# Patient Record
Sex: Female | Born: 1975 | Race: White | Hispanic: No | Marital: Married | Smoking: Former smoker
Health system: Southern US, Community
[De-identification: ages and names within clinical notes are randomized; demographics above are authoritative.]

## PROBLEM LIST (undated history)

## (undated) ENCOUNTER — Inpatient Hospital Stay (HOSPITAL_COMMUNITY): Payer: Self-pay

## (undated) DIAGNOSIS — R51 Headache: Secondary | ICD-10-CM

## (undated) DIAGNOSIS — R233 Spontaneous ecchymoses: Secondary | ICD-10-CM

## (undated) DIAGNOSIS — R635 Abnormal weight gain: Secondary | ICD-10-CM

## (undated) DIAGNOSIS — R0602 Shortness of breath: Secondary | ICD-10-CM

## (undated) DIAGNOSIS — N644 Mastodynia: Secondary | ICD-10-CM

## (undated) DIAGNOSIS — R5383 Other fatigue: Secondary | ICD-10-CM

## (undated) DIAGNOSIS — T7840XA Allergy, unspecified, initial encounter: Secondary | ICD-10-CM

## (undated) DIAGNOSIS — G479 Sleep disorder, unspecified: Secondary | ICD-10-CM

## (undated) DIAGNOSIS — R238 Other skin changes: Secondary | ICD-10-CM

## (undated) DIAGNOSIS — N6452 Nipple discharge: Secondary | ICD-10-CM

## (undated) DIAGNOSIS — R519 Headache, unspecified: Secondary | ICD-10-CM

## (undated) DIAGNOSIS — R079 Chest pain, unspecified: Secondary | ICD-10-CM

## (undated) DIAGNOSIS — R55 Syncope and collapse: Secondary | ICD-10-CM

## (undated) DIAGNOSIS — J939 Pneumothorax, unspecified: Secondary | ICD-10-CM

## (undated) HISTORY — DX: Sleep disorder, unspecified: G47.9

## (undated) HISTORY — PX: ORIF ULNAR FRACTURE: SHX5417

## (undated) HISTORY — DX: Headache: R51

## (undated) HISTORY — DX: Allergy, unspecified, initial encounter: T78.40XA

## (undated) HISTORY — DX: Mastodynia: N64.4

## (undated) HISTORY — PX: PLEURAL SCARIFICATION: SHX748

## (undated) HISTORY — DX: Other skin changes: R23.8

## (undated) HISTORY — DX: Spontaneous ecchymoses: R23.3

## (undated) HISTORY — DX: Nipple discharge: N64.52

## (undated) HISTORY — PX: BILATERAL OOPHORECTOMY: SHX1221

## (undated) HISTORY — DX: Abnormal weight gain: R63.5

## (undated) HISTORY — DX: Headache, unspecified: R51.9

## (undated) HISTORY — DX: Other fatigue: R53.83

## (undated) HISTORY — DX: Shortness of breath: R06.02

## (undated) HISTORY — DX: Syncope and collapse: R55

## (undated) HISTORY — DX: Chest pain, unspecified: R07.9

---

## 1999-12-04 ENCOUNTER — Encounter: Payer: Self-pay | Admitting: Emergency Medicine

## 1999-12-04 ENCOUNTER — Inpatient Hospital Stay (HOSPITAL_COMMUNITY): Admission: EM | Admit: 1999-12-04 | Discharge: 1999-12-05 | Payer: Self-pay | Admitting: Emergency Medicine

## 1999-12-05 ENCOUNTER — Encounter: Payer: Self-pay | Admitting: Cardiothoracic Surgery

## 1999-12-06 ENCOUNTER — Encounter: Payer: Self-pay | Admitting: Cardiothoracic Surgery

## 1999-12-06 ENCOUNTER — Inpatient Hospital Stay (HOSPITAL_COMMUNITY): Admission: EM | Admit: 1999-12-06 | Discharge: 1999-12-10 | Payer: Self-pay | Admitting: Emergency Medicine

## 1999-12-06 ENCOUNTER — Encounter: Payer: Self-pay | Admitting: Thoracic Surgery (Cardiothoracic Vascular Surgery)

## 1999-12-07 ENCOUNTER — Encounter: Payer: Self-pay | Admitting: Thoracic Surgery (Cardiothoracic Vascular Surgery)

## 1999-12-08 ENCOUNTER — Encounter: Payer: Self-pay | Admitting: Thoracic Surgery (Cardiothoracic Vascular Surgery)

## 1999-12-09 ENCOUNTER — Encounter: Payer: Self-pay | Admitting: Thoracic Surgery (Cardiothoracic Vascular Surgery)

## 1999-12-10 ENCOUNTER — Encounter: Payer: Self-pay | Admitting: Thoracic Surgery (Cardiothoracic Vascular Surgery)

## 1999-12-23 ENCOUNTER — Encounter: Admission: RE | Admit: 1999-12-23 | Discharge: 1999-12-23 | Payer: Self-pay | Admitting: Cardiothoracic Surgery

## 1999-12-23 ENCOUNTER — Encounter: Payer: Self-pay | Admitting: Cardiothoracic Surgery

## 2003-11-03 ENCOUNTER — Emergency Department (HOSPITAL_COMMUNITY): Admission: EM | Admit: 2003-11-03 | Discharge: 2003-11-03 | Payer: Self-pay | Admitting: Emergency Medicine

## 2003-11-04 ENCOUNTER — Emergency Department (HOSPITAL_COMMUNITY): Admission: EM | Admit: 2003-11-04 | Discharge: 2003-11-04 | Payer: Self-pay | Admitting: Emergency Medicine

## 2003-11-06 ENCOUNTER — Emergency Department (HOSPITAL_COMMUNITY): Admission: EM | Admit: 2003-11-06 | Discharge: 2003-11-06 | Payer: Self-pay | Admitting: Emergency Medicine

## 2003-11-10 ENCOUNTER — Other Ambulatory Visit: Admission: RE | Admit: 2003-11-10 | Discharge: 2003-11-10 | Payer: Self-pay | Admitting: Obstetrics and Gynecology

## 2004-03-21 ENCOUNTER — Inpatient Hospital Stay (HOSPITAL_COMMUNITY): Admission: AD | Admit: 2004-03-21 | Discharge: 2004-03-21 | Payer: Self-pay | Admitting: Obstetrics and Gynecology

## 2004-03-24 ENCOUNTER — Inpatient Hospital Stay (HOSPITAL_COMMUNITY): Admission: AD | Admit: 2004-03-24 | Discharge: 2004-03-24 | Payer: Self-pay | Admitting: Obstetrics and Gynecology

## 2004-04-01 ENCOUNTER — Inpatient Hospital Stay (HOSPITAL_COMMUNITY): Admission: AD | Admit: 2004-04-01 | Discharge: 2004-04-01 | Payer: Self-pay | Admitting: Obstetrics and Gynecology

## 2004-04-05 ENCOUNTER — Inpatient Hospital Stay (HOSPITAL_COMMUNITY): Admission: AD | Admit: 2004-04-05 | Discharge: 2004-04-05 | Payer: Self-pay | Admitting: Obstetrics and Gynecology

## 2004-04-06 ENCOUNTER — Inpatient Hospital Stay (HOSPITAL_COMMUNITY): Admission: AD | Admit: 2004-04-06 | Discharge: 2004-04-06 | Payer: Self-pay | Admitting: Obstetrics and Gynecology

## 2004-04-11 ENCOUNTER — Inpatient Hospital Stay (HOSPITAL_COMMUNITY): Admission: AD | Admit: 2004-04-11 | Discharge: 2004-04-11 | Payer: Self-pay | Admitting: Obstetrics and Gynecology

## 2004-04-23 ENCOUNTER — Inpatient Hospital Stay (HOSPITAL_COMMUNITY): Admission: AD | Admit: 2004-04-23 | Discharge: 2004-04-23 | Payer: Self-pay | Admitting: Obstetrics and Gynecology

## 2004-04-25 ENCOUNTER — Observation Stay (HOSPITAL_COMMUNITY): Admission: AD | Admit: 2004-04-25 | Discharge: 2004-04-26 | Payer: Self-pay | Admitting: Obstetrics and Gynecology

## 2004-05-05 ENCOUNTER — Observation Stay (HOSPITAL_COMMUNITY): Admission: AD | Admit: 2004-05-05 | Discharge: 2004-05-06 | Payer: Self-pay | Admitting: Obstetrics and Gynecology

## 2004-05-09 ENCOUNTER — Inpatient Hospital Stay (HOSPITAL_COMMUNITY): Admission: AD | Admit: 2004-05-09 | Discharge: 2004-05-09 | Payer: Self-pay | Admitting: Obstetrics and Gynecology

## 2004-05-14 ENCOUNTER — Inpatient Hospital Stay (HOSPITAL_COMMUNITY): Admission: AD | Admit: 2004-05-14 | Discharge: 2004-05-17 | Payer: Self-pay | Admitting: Obstetrics and Gynecology

## 2004-05-21 ENCOUNTER — Inpatient Hospital Stay (HOSPITAL_COMMUNITY): Admission: AD | Admit: 2004-05-21 | Discharge: 2004-05-21 | Payer: Self-pay | Admitting: Obstetrics and Gynecology

## 2004-05-26 ENCOUNTER — Inpatient Hospital Stay (HOSPITAL_COMMUNITY): Admission: AD | Admit: 2004-05-26 | Discharge: 2004-05-26 | Payer: Self-pay | Admitting: Obstetrics and Gynecology

## 2004-05-29 ENCOUNTER — Inpatient Hospital Stay (HOSPITAL_COMMUNITY): Admission: AD | Admit: 2004-05-29 | Discharge: 2004-05-29 | Payer: Self-pay | Admitting: Obstetrics and Gynecology

## 2004-06-03 ENCOUNTER — Inpatient Hospital Stay (HOSPITAL_COMMUNITY): Admission: AD | Admit: 2004-06-03 | Discharge: 2004-06-03 | Payer: Self-pay | Admitting: Obstetrics and Gynecology

## 2004-06-09 ENCOUNTER — Inpatient Hospital Stay (HOSPITAL_COMMUNITY): Admission: AD | Admit: 2004-06-09 | Discharge: 2004-06-09 | Payer: Self-pay | Admitting: Obstetrics and Gynecology

## 2004-06-10 ENCOUNTER — Inpatient Hospital Stay (HOSPITAL_COMMUNITY): Admission: AD | Admit: 2004-06-10 | Discharge: 2004-06-12 | Payer: Self-pay | Admitting: Obstetrics and Gynecology

## 2004-06-10 ENCOUNTER — Encounter (INDEPENDENT_AMBULATORY_CARE_PROVIDER_SITE_OTHER): Payer: Self-pay | Admitting: Specialist

## 2004-07-23 ENCOUNTER — Other Ambulatory Visit: Admission: RE | Admit: 2004-07-23 | Discharge: 2004-07-23 | Payer: Self-pay | Admitting: Obstetrics and Gynecology

## 2007-02-09 ENCOUNTER — Emergency Department (HOSPITAL_COMMUNITY): Admission: EM | Admit: 2007-02-09 | Discharge: 2007-02-09 | Payer: Self-pay | Admitting: Emergency Medicine

## 2007-02-12 ENCOUNTER — Ambulatory Visit (HOSPITAL_COMMUNITY): Admission: RE | Admit: 2007-02-12 | Discharge: 2007-02-12 | Payer: Self-pay | Admitting: Emergency Medicine

## 2007-02-23 ENCOUNTER — Ambulatory Visit (HOSPITAL_COMMUNITY): Admission: RE | Admit: 2007-02-23 | Discharge: 2007-02-23 | Payer: Self-pay | Admitting: Family Medicine

## 2008-02-10 ENCOUNTER — Emergency Department (HOSPITAL_COMMUNITY): Admission: EM | Admit: 2008-02-10 | Discharge: 2008-02-10 | Payer: Self-pay | Admitting: Emergency Medicine

## 2008-02-27 ENCOUNTER — Emergency Department (HOSPITAL_COMMUNITY): Admission: EM | Admit: 2008-02-27 | Discharge: 2008-02-27 | Payer: Self-pay | Admitting: Emergency Medicine

## 2008-03-10 ENCOUNTER — Emergency Department (HOSPITAL_COMMUNITY): Admission: EM | Admit: 2008-03-10 | Discharge: 2008-03-10 | Payer: Self-pay | Admitting: Emergency Medicine

## 2008-03-12 ENCOUNTER — Ambulatory Visit: Payer: Self-pay | Admitting: Cardiology

## 2008-03-12 LAB — CONVERTED CEMR LAB: TSH: 0.82 microintl units/mL (ref 0.35–5.50)

## 2008-03-13 ENCOUNTER — Ambulatory Visit: Payer: Self-pay | Admitting: Cardiology

## 2008-05-07 ENCOUNTER — Ambulatory Visit: Payer: Self-pay | Admitting: Cardiology

## 2008-05-16 ENCOUNTER — Emergency Department (HOSPITAL_COMMUNITY): Admission: EM | Admit: 2008-05-16 | Discharge: 2008-05-16 | Payer: Self-pay | Admitting: Emergency Medicine

## 2011-02-15 NOTE — Assessment & Plan Note (Signed)
[facility]                            CARDIOLOGY OFFICE NOTE   NAME:Shannon Kemp, Shannon Kemp                   MRN:          [id number]  DATE:05/07/2008                            DOB:          03-17-1976    PRIMARY CARE:  Maxine Glenn Winter PA-C   REASON FOR PRESENTATION:  Evaluate patient with palpitation and chest  pain.   HISTORY OF PRESENT ILLNESS:  The patient is 35 years old.  I saw her in  June 2009 for evaluation of the above complaints.  She had been to the  emergency room.  She had had some palpitations.  I put a Holter monitor  on for 48 hours.  This demonstrated sinus rhythm.  There were occasional  PVCs and PACs that were rare.  There were occasional blocked PACs with  2:1 conduction and sinus arrhythmia.  She did have 2 brief episodes of a  sustained tachycardia that looked like a sinus tachycardia.  This  happened at about 5 p.m., both days.  She does not think she was doing  anything vigorous and does not recall these episodes.  They were very  short-lived.  She has had no syncope.  She has had no shortness of  breath or PND.  She has some chest discomfort which seems to happen very  briefly every day and not as frequently as it was.   PAST MEDICAL HISTORY:  Spontaneous pneumothorax in 2001, hypertension,  diabetes, and hyperlipidemia.   PAST SURGICAL HISTORY:  None.   ALLERGIES:  None.   MEDICATIONS:  Multivitamin.   REVIEW OF SYSTEMS:  As stated in the HPI and otherwise negative for  other systems.   PHYSICAL EXAMINATION:  GENERAL:  The patient is in no distress.  VITAL SIGNS:  Blood pressure 110/80, heart rate 92 and regular, weight  125 pounds, body mass index 20.  HEENT:  Eyes are unremarkable.  Pupils are equal, round and reactive to  light.  Fundi not visualized.  Oral mucosa unremarkable.  NECK:  No jugular distention at 45 degrees, carotid upstroke brisk and  symmetrical, no bruits, no thyromegaly.  LYMPHATICS:  No  cervical, axillary, or inguinal adenopathy.  LUNGS:  Clear to auscultation bilaterally.  HEART:  PMI not displaced or sustained, S1and S2 within normal limits.  No S3, no S4, no clicks, no rubs, no murmurs.  ABDOMEN:  Flat, positive  bowel sounds normal in frequency and pitch.  No bruits, no rebound, no  guarding, no midline pulsatile mass, no organomegaly.  SKIN:  No rashes, no nodules.  EXTREMITIES:  Pulses 2+ , no edema.   ASSESSMENT AND PLAN:  1. Palpitations.  The patient has not been having any symptomatic      palpitations.  She is having some daily very brief chest discomfort      that could be palpitations.  However, this is brief.  It is not      reproducible.  She is able to do everything she wants without any      cardiovascular symptoms and has had none of the severe episodes      that prompted  her ER visit.  The monitor does show the tachy      arrhythmia.  We did check a TSH, which was normal.  Other labs were      done in the ER and were normal.  At this point, in the absence of      further symptoms, no further cardiovascular testing is suggested.      The patient knows to call me if she has any symptomatic      palpitations.  If she has any sustained tachy palpitations, she      should again present to the emergency room.  2. Follow up.  I will see her back then as needed.     Rollene Rotunda, MD, [facility]  Electronically Signed    JH/MedQ  DD: 05/07/2008  DT: 05/08/2008  Job #: [id number]

## 2011-02-15 NOTE — Assessment & Plan Note (Signed)
[facility]                            CARDIOLOGY OFFICE NOTE   NAME:Shannon Kemp, Shannon Kemp                   MRN:          [id number]  DATE:03/12/2008                            DOB:          03/06/1976    REFERRING PHYSICIAN:  Bryna Colander, [place]   REASON FOR PRESENTATION:  Evaluate patient with arrhythmia and chest  pain.   HISTORY OF PRESENT ILLNESS:  The patient is a pleasant 35 year old  without prior cardiac history.  She was at the emergency room on March 10, 2008 with some chest discomfort.  This started as some shoulder pain  associated with previous right rotator cuff injury.  She went to  [facility] but while she was there developed tachycardia and chest pain.  This was sharp and fairly significant.  She did not have any associated  nausea, vomiting, diaphoresis, or new shortness of breath.  This was  aggravated by coughing and movement.  It did not radiate to her neck or  to her arms.  She never had this discomfort before.  In the emergency  room, she had no acute EKG changes.  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  She  had known previous pneumothorax.  Her cardiac enzymes were negative.  She was not felt to have angina, but she was noted apparently to have an  arrhythmia while she was there.  I do not have any of the strips.  The  patient reports that it has been noted heart rates would increase up to  the 120 range and down to the 90s while at rest.   The patient says in retrospect she noticed this before.  She has since  noticed it daily.  It only lasts for few seconds, but her heart rate  will increase and then go back down.  She does not describe it as an  abrupt onset.  She has felt a little lightheaded.  She has had some mild  orthostatic symptoms.  She has had no syncope.  She does not get chest  discomfort with this.  It does not happen when she is active.  She has  otherwise been feeling relatively well.   PAST MEDICAL HISTORY:  Spontaneous pneumothorax in 2001.  She has no  history of hypertension, diabetes, and hyperlipidemia.   PAST SURGICAL HISTORY:  None.   ALLERGIES:  MORPHINE.   MEDICATIONS:  Ibuprofen and oxycodone.   SOCIAL HISTORY:  The patient works in Clinical biochemist.  She is married.  She has a 33 year old and a 7-year-old.  She smoked a little bit  quitting in 2001.  She does not drink alcohol.   FAMILY HISTORY:  Noncontributory for early coronary artery disease,  sudden cardiac death, cardiomyopathy, syncope, or arrhythmias.   REVIEW OF SYSTEMS:  Positive for occasional headaches and dizziness,  mild cough, and joint pains.  Negative for other systems.   PHYSICAL EXAMINATION:  GENERAL: The patient is in no distress.  VITAL SIGNS: Blood pressure 130/85, heart rate 94 and regular, weight  124 pounds, and body mass index is 22.  HEENT:  Eyelids  are unremarkable.  Pupils equal, round, and reactive to  light.  Fundi within normal limits, oral mucosa unremarkable.  NECK:  No jugular venous distention at 45 degrees.  Carotid upstrokes  are brisk and symmetrical.  No bruits, no thyromegaly.  LYMPHATICS: No cervical, axillary, and inguinal adenopathy.  LUNGS:  Clear to auscultation bilaterally.  BACK:  No costovertebral angle tenderness.  CHEST:  Unremarkable.  HEART:  PMI is nondisplaced or sustained, S1 and S2 within normal  limits.  No S3 and no S4.  No clicks, rubs, or murmurs.  ABDOMEN:  Flat, positive bowel sounds, normal in frequency and pitch, no  bruits, rebound, guarding or midline pulsatile mass.  No  hepatosplenomegaly.  SKIN:  No rashes.  EXTREMITIES:  2+ pulses throughout, no edema, no cyanosis or clubbing.  NEURO:  Oriented to person, place, and time.  Cranial nerves II through  XII grossly intact, motor grossly intact.   EKG sinus rhythm, rate 86, axis within normal limits, intervals within  normal limits, RSR  prime V1 and V2, no acute ST-T wave changes.   ASSESSMENT/PLAN:  1. Chest pain.  The patient's chest pain is very atypical.  She has no      significant risk factors for obstructive coronary disease.  I think      the pretest probability of obstructive coronary disease is very,      very low and further testing would not be justified.  2. Palpitations.  The patient is describing palpitations and      apparently had some arrhythmia in the ER though I do not have      documentation of this.  I am going to have her wear a 48-hour      Holter monitor and I will check a TSH.  Further evaluation will be      based on this.  3. Followup.  I will see her back in 1 month or sooner if she has any      problems with further arrhythmias or discomfort.      Rollene Rotunda, MD, [facility]  Electronically Signed    JH/MedQ  DD: 03/12/2008  DT: 03/13/2008  Job #: [id number]   cc:   Bryna Colander, PA

## 2011-02-18 NOTE — Discharge Summary (Signed)
[facility]  Patient:    Shannon Kemp, Shannon Kemp                       MRN: [id number] Adm. Date:  [id number] Disc. Date: [id number] Attending:  Charlett Lango Dictator:   Loura Pardon, P.A.                           Discharge Summary  DATE OF BIRTH:  Jul 06, 1976  ATTENDING PHYSICIAN:  Salvatore Decent. Dorris Fetch, M.D., and Gwenith Daily. Tyrone Sage, M.D.  FINAL DIAGNOSIS:  Spontaneous left pneumothorax.  SECONDARY DIAGNOSES:  None.  DISCHARGE DISPOSITION:  [name] was judged a suitable candidate for discharge on hospital day #5 after presenting with a spontaneous left pneumothorax.  She had a left thoracostomy tube placed by Dr. Charlett Lango on March 5.  She had good expansion of her 20 to 25% left pneumothorax.  After the tube was placed, her pain was controlled at first with a PCA morphine pump and then changed to adequate oral medications.  The chest tube was removed on hospital day #4.  There was a 15% left apical pneumothorax after the chest tube was removed.  The patient was asymptomatic except for some chest pain at the site of the incision.  A chest x-ray was taken on the day after the tube was removed, hospital day #5.  There was no change in the size of the left apical pneumothorax and she was discharged home.  DISCHARGE MEDICATIONS:  Ultram 50 mg two tablets every 4 to 6 hours p.r.n. pain.  DISCHARGE ACTIVITY:  Ambulation as tolerated.  She is asked not to drive while taking Ultram.  DISCHARGE DIET:  Low sodium, low cholesterol.  WOUND CARE:  She is able to bathe starting Saturday, March 10.  She is to keep her incisions clean and dry.  FOLLOW-UP:  She will have an office visit with Dr. Sheliah Plane, Thursday, December 23, 1999, at 1:30 in the afternoon, and she will have a chest x-ray one hour before her appointment with Dr. Tyrone Sage.  HISTORY OF PRESENT ILLNESS:  [name] is a 35 year old female admitted to [facility] on March 4 by Dr. Tyrone Sage.  She had a 15% spontaneous left pneumothorax.  She was kept for observation there and, after no change was observed by chest x-ray, she was discharged. However, the morning of March 5, she had increasing shortness of breath and stabbing, sharp, left-sided chest pain.  She came to the emergency room at [facility].  Chest x-ray taken there showed a 20 to 25% pneumothorax.  Her old films were not available for comparison although the pneumothorax at [facility] was estimated at 15%.  HOSPITAL COURSE:  She was admitted by Dr. Charlett Lango and a #28 French left thoracostomy tube was placed without complications.  Follow-up chest x-ray showed partial re-expansion of the left lung with a pneumothorax estimated at 10 to 15% down from the 20 to 25% on admission.  Chest x-ray on March 6 showed continuance of small apical pneumothorax.  She was changed from her morphine sulfate pump to Tylox and Ultram.  On hospital day #3, she was achieving 97% oxygen saturations on room air.  The chest tube had minimal drainage, no air leak.  She was taken off suction at night, and a chest x-ray was taken on the morning of March 8, hospital day #4.  There was a tiny left apical pneumothorax unchanged from March 7 after 16 hours without suction. The chest tube was discontinued.  Follow-up chest x-ray showed a 15% pneumothorax after the chest tube was removed.  [name] was asymptomatic. She was kept in the hospital overnight and a follow-up chest x-ray was taken on the morning of March 9.  There was no change in the size of the left pneumothorax.  Pain was well controlled with oral analgesia and she was discharged home with follow-up with Dr. Tyrone Sage in two weeks. DD:  02/03/00 TD:  02/06/00 Job: [id number]

## 2011-02-18 NOTE — Discharge Summary (Signed)
Shannon Kemp, Shannon Kemp            ACCOUNT NO.:  [id number]   MEDICAL RECORD NO.:  [id number]          PATIENT TYPE:  OBV   LOCATION:  9168                          [facility]ACILITY:  [facility]   PHYSICIAN:  [name], M.D.DATE OF BIRTH:  06-05-1976   DATE OF ADMISSION:  05/05/2004  DATE OF DISCHARGE:  05/06/2004                                 DISCHARGE SUMMARY   DISCHARGE DIAGNOSES:  1.  Intrauterine pregnancy at 32 weeks 5 days gestation.  2.  Preterm labor versus irritability.   OPERATIONS AND SPECIAL PROCEDURES:  None.   CONSULTATIONS:  None.   DISCHARGE MEDICATIONS:  Terbutaline 2.5 mg q.4h.   HISTORY AND PHYSICAL:  This is a 35 year old gravida 3 para 1 AB 1 admitted  per Dr. Chiquita Loth for evaluation of contractions.  The patient is  currently on terbutaline 2.5 mg q.4h.  For the remainder of the history and  physical, please see chart.   HOSPITAL COURSE:  The patient was admitted to [facility] of  [place].  Admission laboratory studies were drawn.  She was observed  during the day of hospitalization and noted to have only rare contractions.  Fetal heart rate remained stable.  On May 06, 2004 in the evening she was  felt to be stable for discharge and was discharged home in satisfactory  condition.   DISPOSITION:  The patient is to return to [facility] and Obstetrics  within 1-2 weeks.      JAM/MEDQ  D:  06/30/2004  T:  06/30/2004  Job:  [id number]

## 2011-02-18 NOTE — Discharge Summary (Signed)
Shannon Kemp, ADSIT            ACCOUNT NO.:  [id number]   MEDICAL RECORD NO.:  [id number]          PATIENT TYPE:  INP   LOCATION:  9153                          [facility]ILITY[facility]  [facility]   PHYSICIAN:  [name], M.D.DATE OF BIRTH:  08/20/1976   DATE OF ADMISSION:  05/14/2004  DATE OF DISCHARGE:  05/17/2004                                 DISCHARGE SUMMARY   DISCHARGE DIAGNOSES:  1.  Intrauterine pregnancy at approximately [redacted] weeks gestation, undelivered.  2.  Preterm labor.  3.  Questionable urinary tract infection.   OPERATIONS AND SPECIAL PROCEDURES:  None.   DISCHARGE MEDICATIONS:  Procardia 30 mg p.o. q.6h.   HISTORY AND PHYSICAL:  This is a 35 year old gravida 3 para 1-0-1-0 with [facility]  June 27, 2004 currently at 33 weeks 4 days gestation.  The patient  presented complaining of contractions every 3-4 minutes.  She was already on  antibiotics for a questionable UTI.  Cervical examination per R.N. revealed  a 2-3 cm dilatation, 60% effacement, -3 station, vertex presentation.  Membranes appeared intact.  For the remainder of the past medical history  and history and physical, please see chart.   HOSPITAL COURSE:  The patient was admitted to [facility]  [place].  Admission laboratory studies were drawn.  On May 14, 2004  she was placed on magnesium sulfate.  She was also given betamethasone in  order to expedite the fetal lung maturity.  On May 16, 2004 she was  switched to nifedipine 30 mg q.6h. per Dr. Sydnee Cabal.  By May 17, 2004  she reported only rare contraction.  She was felt to be stable for discharge  and was discharged home in satisfactory condition.  She is to return to the  office in 1 week.      JAM/MEDQ  D:  06/30/2004  T:  06/30/2004  Job:  [id number]

## 2011-02-18 NOTE — Discharge Summary (Signed)
Shannon Kemp, Shannon Kemp            ACCOUNT NO.:  [id number]   MEDICAL RECORD NO.:  [id number]          PATIENT TYPE:  INP   LOCATION:  9147                          [facility]   PHYSICIAN:  [name], M.D.DATE OF BIRTH:  15-Sep-1976   DATE OF ADMISSION:  06/10/2004  DATE OF DISCHARGE:  06/12/2004                                 DISCHARGE SUMMARY   DISCHARGE DIAGNOSES:  1.  Intrauterine pregnancy at 37 1/[redacted] weeks gestation.  2.  History of preterm labor.  3.  Labor.  4.  Term birth living child vertex.   OPERATION:  OB delivery.   CONSULTATIONS:  None.   HISTORY:  This is a 35 year old, gravida 3, para 0-1-1-1, [facility] June 27, 2004. Prenatal care was complicated by preterm labor.  The patient took  terbutaline until 37 weeks. She presented to the office complaining of  contractions.   HOSPITAL COURSE:  Initial cervical examination revealed the cervix to be 4-5  cm dilated, 85% effaced, -2 station, vertex presentation. Artifical rupture  of membranes was accomplished which revealed clear fluid.  IUPC was placed.  The patient went on to labor and deliver on June 10, 2004. The infant  was a 6 pound 6 ounce female, Apgar's 8 at 1 minute, 9 at 5 minutes, sent to  the newborn nursery. The labor was __________ Dr. Lonell Face over a first  degree episiotomy which was repaired without difficulty.  There was a small  vaginal inclusion cyst noted at delivery at 6 o'clock at the introitus. This  was excised and submitted to pathology for histologic studies.  The  patient's postpartum course was benign.  It was noted that her RPR returned  as positive. A confirmation test, TPPA , is pending.  She was informed of  the finding.  On June 12, 2004, the confirmatory test of TPPA returned  as negative.  She was felt to be stable for discharge and discharged home on  June 12, 2004 afebrile and in satisfactory condition.   ACCESSORY AND CLINICAL FINDINGS:  Hemoglobin and  hematocrit on admission  were 11.2 and 33.3 respectively. Repeat values obtained June 11, 2004  were 9.9 and 28.9 respectively.   DISPOSITION:  The patient is to return to [facility] and Obstetrics  in 4-6 weeks for postpartum evaluation.      JAM/MEDQ  D:  06/30/2004  T:  06/30/2004  Job:  [id number]

## 2011-02-18 NOTE — H&P (Signed)
NAME:  Shannon Kemp, Shannon Kemp                      ACCOUNT NO.:  [id number]   MEDICAL RECORD NO.:  [id number]                   PATIENT TYPE:  INP   LOCATION:  9156                                 [facility]   PHYSICIAN:  Timothy P. Fontaine, M.D.           DATE OF BIRTH:  [birth year]   DATE OF ADMISSION:  04/25/2004  DATE OF DISCHARGE:  04/26/2004                                HISTORY & PHYSICAL   CHIEF COMPLAINT:  Rule out rupture of membranes.   HISTORY OF PRESENT ILLNESS:  A 35 year old, G3, P40, female at 42 to 74 weeks  of gestation who initially called reporting a single gush of warm fluid per  vagina as she was taking a shower.  No recurrence with no continued leaking  since then.  The patient had also told the triage nurse that she was  contracting and she was asked to present to triage for further evaluation.  Upon triage evaluation, the patient was found to have occasional irregular  contractions per nurse practitioner examination, cervix was long and closed  without evidence of rupture of membranes, there was no pooling, no ferning.  She did have positive nitrazine, although this was on the cervical mucus  that was noted to have some bloody discharge.  The patient was admitted for  observation.  She underwent ultrasound screening which showed an AFI of 17.  Her cervix is 3.2 transvaginal and closed.  Upon admission, she was found to  have no evidence of leaking and no reported leaking, although was noted to  begin contracting approximately every 5 minutes.  The patient was  subsequently begun on IV hydration, subcutaneous terbutaline administration  and oral terbutaline continuation, and her contractions have resolved.  Again upon questioning, the patient relates she has had no recurrence of her  leaking and that this was a single episode in the shower.  For the remainder  of her prenatal course and history, see her [place].   PHYSICAL EXAMINATION:  VITAL SIGNS:  On  examination this morning, she is  afebrile, vital signs are stable.  HEENT:  Normal.  LUNGS:  Clear.  CARDIAC:  Regular rate without murmurs, rubs, or gallops.  ABDOMEN:  Benign.  PELVIC:  Uterus soft, nontender.  Appropriate for dates.  External monitor  reviewed, and shows a reactive fetus without contractions.  Pelvic  examination speculum:  Cervix is long and closed, vagina is dry.  A  GC/chlamydia screen of the cervix was done.  Vaginal Strep culture done.  Digital examination:  Cervix is long and closed, vertex presentation, high.   ASSESSMENT AND PLAN:  A 35 year old G74, P70, female at 34 to [redacted] weeks  gestation, history of preterm contractions previously during this pregnancy.  Was placed on terbutaline, although this was subsequently stopped a week or  two ago.  Now presents with a single episode of wetness from the vagina  which I suspect is urine given the history.  There  is no evidence of rupture  of membranes.  Positive nitrazine I think is a false-positive due to some  bloodyish change on her cervix.  On reinspection the morning of discharge,  her cervix is normal in appearance, no evidence of bloody discharge.  Cultures were taken.  We will check urinalysis and urine culture before  discharge, and then discharge home on oral terbutaline 2.5 q.4h.  Fetal  fibronectin was not done due to recent pelvic the  evening before.  If the patient represents with contractions, we will plan  on fetal fibronectin before examination.  At this time, we will continue on  oral terbutaline.  __________precautions were reviewed with her.  If remains  quiet with good fetal movement, we will followup for her OB appointment one  week following discharge.                                               Timothy P. Audie Box, M.D.    TPF/MEDQ  D:  04/26/2004  T:  04/26/2004  Job:  [id number]

## 2011-04-07 ENCOUNTER — Other Ambulatory Visit: Payer: Self-pay

## 2011-04-22 ENCOUNTER — Ambulatory Visit (INDEPENDENT_AMBULATORY_CARE_PROVIDER_SITE_OTHER): Payer: Private Health Insurance - Indemnity | Admitting: Surgery

## 2011-04-22 ENCOUNTER — Encounter (INDEPENDENT_AMBULATORY_CARE_PROVIDER_SITE_OTHER): Payer: Self-pay | Admitting: Surgery

## 2011-04-22 VITALS — BP 114/88 | HR 80 | Temp 97.8°F | Ht 65.0 in | Wt 150.2 lb

## 2011-04-22 DIAGNOSIS — D249 Benign neoplasm of unspecified breast: Secondary | ICD-10-CM

## 2011-04-22 NOTE — Patient Instructions (Signed)
You will meet surgery scheduler to discuss surgery date,  Time,  Cost and etc.

## 2011-04-22 NOTE — Progress Notes (Signed)
Shannon Kemp is a 35 y.o. female.    Chief Complaint  Patient presents with  . Other    new pt- eval br paploma    HPI HPI The patient is a 35 year old premenopausal female sent at the request of Dr. Hardie Pulley of [facility] do to a right breast papilloma. The patient presents with a history of bilateral discharge. The discharge is clear. Her primary care doctor did a test to see if there is bloody discharge and this was positive. Mammography was unremarkable. Ultrasound showed bilateral duct ectasia and a mass under the right nipple. This was biopsied and found to be a papilloma and she also complains of bilateral breast pain. She cannot feel any masses. This has been going on for about 2-3 months.   Past Medical History  Diagnosis Date  . Weight increase   . Fatigue   . Sleep difficulties   . Breast pain   . Nipple discharge   . Bruises easily   . Generalized headaches   . Allergy     Past Surgical History  Procedure Date  . Orif ulnar fracture   . Pleural scarification     Family History  Problem Relation Age of Onset  . Hypertension Mother   . Hypertension Father     Social History History  Substance Use Topics  . Smoking status: Former Games developer  . Smokeless tobacco: Not on file  . Alcohol Use: No    Allergies  Allergen Reactions  . Adhesive (Tape)   . Morphine And Related     Current Outpatient Prescriptions  Medication Sig Dispense Refill  . acetaminophen (TYLENOL) 500 MG tablet Take 500 mg by mouth every 6 (six) hours as needed.        Marland Kitchen ibuprofen (ADVIL,MOTRIN) 200 MG tablet Take 200 mg by mouth every 6 (six) hours as needed.          Review of Systems Review of Systems  Constitutional: Negative.   HENT: Negative.   Eyes: Negative.   Respiratory: Negative.   Cardiovascular: Negative.   Gastrointestinal: Negative.   Genitourinary: Negative.   Skin: Negative.   Neurological: Negative.   Endo/Heme/Allergies: Negative.   Psychiatric/Behavioral:  Negative.     Physical Exam Physical Exam  Constitutional: She is oriented to person, place, and time. She appears well-developed and well-nourished.  HENT:  Head: Normocephalic and atraumatic.  Nose: Nose normal.  Eyes: Conjunctivae and EOM are normal.  Neck: Normal range of motion. Neck supple.  Cardiovascular: Normal rate and regular rhythm.   Respiratory: Effort normal and breath sounds normal.       Right pressures post surgical changes. There is some bruising of the right nipple. There is no discharge no nipple today on the right. Left breast is normal. Both axilla are normal.  Lymphadenopathy:    She has no cervical adenopathy.  Neurological: She is alert and oriented to person, place, and time. She has normal reflexes.  Skin: Skin is warm and dry.  Psychiatric: She has a normal mood and affect. Her behavior is normal. Judgment and thought content normal.     Blood pressure 114/88, pulse 80, temperature 97.8 F (36.6 C), height 5\' 5"  (1.651 m), weight 150 lb 3.2 oz (68.13 kg).  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  Plan: I discussed surgical options with the patient and her husband today. Excision of this is recommended in most cases do to a small  risk of potential malignancy associated with papillomas. The risk is under 10%. It would also treat her discharge. Her other option would be observation. I do not think anything needs to be done to the left breast since that discharge is stopped. Core biopsy showed the lesion in the right breast to be a papilloma. Risks of bleeding, infection, skin loss, nipple loss, and the need for further surgery were discussed with the patient and her husband today. They understand the above would like to have it removed.The procedure has been discussed with the patient. Alternatives to surgery have been discussed with the patient.  Risks of surgery  include bleeding,  Infection,  Seroma formation, death,  and the need for further surgery.   The patient understands and wishes to proceed.  Bayard More A. 04/22/2011, 12:19 PM

## 2011-05-11 ENCOUNTER — Encounter (INDEPENDENT_AMBULATORY_CARE_PROVIDER_SITE_OTHER): Payer: Self-pay | Admitting: Surgery

## 2011-06-30 LAB — POCT I-STAT, CHEM 8
BUN: 7
BUN: 7
Calcium, Ion: 1.21
Calcium, Ion: 1.21
Chloride: 105
Chloride: 105
Creatinine, Ser: 0.8
Creatinine, Ser: 0.9
Glucose, Bld: 85
Glucose, Bld: 89
HCT: 43
HCT: 44
Hemoglobin: 14.6
Hemoglobin: 15
Potassium: 3.3 — ABNORMAL LOW
Potassium: 3.6
Sodium: 140
Sodium: 141
TCO2: 24
TCO2: 25

## 2011-06-30 LAB — PROTIME-INR
INR: 1
Prothrombin Time: 13.8

## 2011-06-30 LAB — APTT: aPTT: 38 — ABNORMAL HIGH

## 2011-06-30 LAB — POCT CARDIAC MARKERS
CKMB, poc: <1 — ABNORMAL LOW
Myoglobin, poc: 20
Operator id: 265201
Troponin i, poc: <0.05

## 2011-06-30 LAB — D-DIMER, QUANTITATIVE: D-Dimer, Quant: 0.29

## 2012-04-01 ENCOUNTER — Emergency Department (HOSPITAL_COMMUNITY): Payer: Private Health Insurance - Indemnity

## 2012-04-01 ENCOUNTER — Encounter (HOSPITAL_COMMUNITY): Payer: Self-pay | Admitting: Emergency Medicine

## 2012-04-01 ENCOUNTER — Ambulatory Visit (HOSPITAL_COMMUNITY)
Admission: EM | Admit: 2012-04-01 | Discharge: 2012-04-01 | Disposition: A | Discharge status: Home / Self Care | Payer: Private Health Insurance - Indemnity | Attending: Obstetrics | Admitting: Obstetrics

## 2012-04-01 ENCOUNTER — Inpatient Hospital Stay (HOSPITAL_COMMUNITY): Payer: Private Health Insurance - Indemnity | Admitting: Anesthesiology

## 2012-04-01 ENCOUNTER — Encounter (HOSPITAL_COMMUNITY): Payer: Self-pay | Admitting: Anesthesiology

## 2012-04-01 ENCOUNTER — Encounter (HOSPITAL_COMMUNITY)
Admission: EM | Disposition: A | Discharge status: Home / Self Care | Payer: Self-pay | Source: Home / Self Care | Attending: Emergency Medicine

## 2012-04-01 DIAGNOSIS — A5485 Gonococcal peritonitis: Secondary | ICD-10-CM | POA: Insufficient documentation

## 2012-04-01 DIAGNOSIS — O009 Unspecified ectopic pregnancy without intrauterine pregnancy: Secondary | ICD-10-CM

## 2012-04-01 DIAGNOSIS — O00109 Unspecified tubal pregnancy without intrauterine pregnancy: Secondary | ICD-10-CM | POA: Insufficient documentation

## 2012-04-01 HISTORY — PX: LAPAROSCOPY: SHX197

## 2012-04-01 LAB — URINE MICROSCOPIC-ADD ON

## 2012-04-01 LAB — TYPE AND SCREEN
ABO/RH(D): O POS
Antibody Screen: NEGATIVE

## 2012-04-01 LAB — CBC
HCT: 37.5 % (ref 36.0–46.0)
MCH: 28.7 pg (ref 26.0–34.0)
Platelets: 244 10*3/uL (ref 150–400)
RBC: 4.67 MIL/uL (ref 3.87–5.11)
RDW: 14.4 % (ref 11.5–15.5)
WBC: 9.2 10*3/uL (ref 4.0–10.5)
WBC: 8.7 10*3/uL (ref 4.0–10.5)

## 2012-04-01 LAB — URINALYSIS, ROUTINE W REFLEX MICROSCOPIC
Nitrite: NEGATIVE
Specific Gravity, Urine: 1.017 (ref 1.005–1.030)
pH: 6 (ref 5.0–8.0)

## 2012-04-01 LAB — WET PREP, GENITAL: Clue Cells Wet Prep HPF POC: NONE SEEN

## 2012-04-01 LAB — HCG, QUANTITATIVE, PREGNANCY: hCG, Beta Chain, Quant, S: 5777 m[IU]/mL — ABNORMAL HIGH (ref <5)

## 2012-04-01 SURGERY — LAPAROSCOPY OPERATIVE
Anesthesia: General | Site: Abdomen | Wound class: Clean Contaminated

## 2012-04-01 MED ORDER — PHENYLEPHRINE HCL 10 MG/ML IJ SOLN
INTRAMUSCULAR | Status: DC | PRN
  Administered 2012-04-01: .12 mg via INTRAVENOUS
  Administered 2012-04-01: .08 mg via INTRAVENOUS

## 2012-04-01 MED ORDER — METOCLOPRAMIDE HCL 5 MG/ML IJ SOLN
INTRAMUSCULAR | Status: AC
  Administered 2012-04-01: 10 mg via INTRAVENOUS
  Filled 2012-04-01: qty 2

## 2012-04-01 MED ORDER — PHENYLEPHRINE 40 MCG/ML (10ML) SYRINGE FOR IV PUSH (FOR BLOOD PRESSURE SUPPORT)
PREFILLED_SYRINGE | INTRAVENOUS | Status: AC
  Filled 2012-04-01: qty 5

## 2012-04-01 MED ORDER — SUCCINYLCHOLINE CHLORIDE 20 MG/ML IJ SOLN
INTRAMUSCULAR | Status: DC | PRN
  Administered 2012-04-01: 100 mg via INTRAVENOUS

## 2012-04-01 MED ORDER — METOCLOPRAMIDE HCL 5 MG/ML IJ SOLN
10.0000 mg | Freq: Once | INTRAMUSCULAR | Status: AC | PRN
  Administered 2012-04-01: 10 mg via INTRAVENOUS

## 2012-04-01 MED ORDER — LACTATED RINGERS IR SOLN
Status: DC | PRN
  Administered 2012-04-01: 3000 mL

## 2012-04-01 MED ORDER — SODIUM CHLORIDE 0.9 % IV SOLN: INTRAVENOUS | Status: DC

## 2012-04-01 MED ORDER — DEXAMETHASONE SODIUM PHOSPHATE 10 MG/ML IJ SOLN
INTRAMUSCULAR | Status: AC
  Filled 2012-04-01: qty 1

## 2012-04-01 MED ORDER — FENTANYL CITRATE 0.05 MG/ML IJ SOLN
INTRAMUSCULAR | Status: AC
  Filled 2012-04-01: qty 5

## 2012-04-01 MED ORDER — BUPIVACAINE HCL (PF) 0.25 % IJ SOLN
INTRAMUSCULAR | Status: DC | PRN
  Administered 2012-04-01: 8 mL

## 2012-04-01 MED ORDER — NEOSTIGMINE METHYLSULFATE 1 MG/ML IJ SOLN
INTRAMUSCULAR | Status: DC | PRN
  Administered 2012-04-01: 3 mg via INTRAVENOUS

## 2012-04-01 MED ORDER — SUCCINYLCHOLINE CHLORIDE 20 MG/ML IJ SOLN
INTRAMUSCULAR | Status: AC
  Filled 2012-04-01: qty 10

## 2012-04-01 MED ORDER — INDIGOTINDISULFONATE SODIUM 8 MG/ML IJ SOLN
INTRAMUSCULAR | Status: AC
  Filled 2012-04-01: qty 5

## 2012-04-01 MED ORDER — LIDOCAINE HCL (CARDIAC) 20 MG/ML IV SOLN
INTRAVENOUS | Status: DC | PRN
  Administered 2012-04-01: 40 mg via INTRAVENOUS

## 2012-04-01 MED ORDER — BUPIVACAINE HCL (PF) 0.25 % IJ SOLN
INTRAMUSCULAR | Status: AC
  Filled 2012-04-01: qty 30

## 2012-04-01 MED ORDER — MIDAZOLAM HCL 2 MG/2ML IJ SOLN
INTRAMUSCULAR | Status: AC
  Filled 2012-04-01: qty 2

## 2012-04-01 MED ORDER — ROCURONIUM BROMIDE 100 MG/10ML IV SOLN
INTRAVENOUS | Status: DC | PRN
  Administered 2012-04-01: 2.5 mg via INTRAVENOUS
  Administered 2012-04-01: 10 mg via INTRAVENOUS
  Administered 2012-04-01: 40 mg via INTRAVENOUS

## 2012-04-01 MED ORDER — ONDANSETRON HCL 4 MG/2ML IJ SOLN
INTRAMUSCULAR | Status: DC | PRN
  Administered 2012-04-01: 4 mg via INTRAVENOUS

## 2012-04-01 MED ORDER — MIDAZOLAM HCL 5 MG/5ML IJ SOLN
INTRAMUSCULAR | Status: DC | PRN
  Administered 2012-04-01: 2 mg via INTRAVENOUS

## 2012-04-01 MED ORDER — CITRIC ACID-SODIUM CITRATE 334-500 MG/5ML PO SOLN
30.0000 mL | Freq: Once | ORAL | Status: AC
  Administered 2012-04-01: 30 mL via ORAL
  Filled 2012-04-01: qty 15

## 2012-04-01 MED ORDER — HYDROCODONE-ACETAMINOPHEN 5-500 MG PO TABS: 1.0000 | ORAL_TABLET | Freq: Four times a day (QID) | ORAL | Status: AC | PRN

## 2012-04-01 MED ORDER — GLYCOPYRROLATE 0.2 MG/ML IJ SOLN
INTRAMUSCULAR | Status: DC | PRN
  Administered 2012-04-01: 0.6 mg via INTRAVENOUS

## 2012-04-01 MED ORDER — MEPERIDINE HCL 25 MG/ML IJ SOLN: 6.2500 mg | INTRAMUSCULAR | Status: DC | PRN

## 2012-04-01 MED ORDER — DEXAMETHASONE SODIUM PHOSPHATE 4 MG/ML IJ SOLN
INTRAMUSCULAR | Status: DC | PRN
  Administered 2012-04-01: 10 mg via INTRAVENOUS

## 2012-04-01 MED ORDER — IBUPROFEN 200 MG PO TABS: 600.0000 mg | ORAL_TABLET | Freq: Four times a day (QID) | ORAL | Status: AC | PRN

## 2012-04-01 MED ORDER — LACTATED RINGERS IV SOLN
INTRAVENOUS | Status: DC | PRN
  Administered 2012-04-01 (×2): via INTRAVENOUS

## 2012-04-01 MED ORDER — FENTANYL CITRATE 0.05 MG/ML IJ SOLN
INTRAMUSCULAR | Status: AC
  Administered 2012-04-01: 25 ug via INTRAVENOUS
  Filled 2012-04-01: qty 2

## 2012-04-01 MED ORDER — PROPOFOL 10 MG/ML IV EMUL
INTRAVENOUS | Status: DC | PRN
  Administered 2012-04-01: 160 mg via INTRAVENOUS

## 2012-04-01 MED ORDER — KETOROLAC TROMETHAMINE 30 MG/ML IJ SOLN
INTRAMUSCULAR | Status: DC | PRN
  Administered 2012-04-01: 30 mg via INTRAVENOUS

## 2012-04-01 MED ORDER — SODIUM CHLORIDE 0.9 % IV SOLN
INTRAVENOUS | Status: DC | PRN
  Administered 2012-04-01: 10 mL via INTRAMUSCULAR

## 2012-04-01 MED ORDER — NEOSTIGMINE METHYLSULFATE 1 MG/ML IJ SOLN
INTRAMUSCULAR | Status: AC
  Filled 2012-04-01: qty 10

## 2012-04-01 MED ORDER — LIDOCAINE HCL (CARDIAC) 20 MG/ML IV SOLN
INTRAVENOUS | Status: AC
  Filled 2012-04-01: qty 5

## 2012-04-01 MED ORDER — GLYCOPYRROLATE 0.2 MG/ML IJ SOLN
INTRAMUSCULAR | Status: AC
  Filled 2012-04-01: qty 2

## 2012-04-01 MED ORDER — ROCURONIUM BROMIDE 50 MG/5ML IV SOLN
INTRAVENOUS | Status: AC
  Filled 2012-04-01: qty 1

## 2012-04-01 MED ORDER — LACTATED RINGERS IV SOLN
INTRAVENOUS | Status: DC
  Administered 2012-04-01: 14:00:00 via INTRAVENOUS

## 2012-04-01 MED ORDER — ONDANSETRON HCL 4 MG/2ML IJ SOLN
INTRAMUSCULAR | Status: AC
  Filled 2012-04-01: qty 2

## 2012-04-01 MED ORDER — FENTANYL CITRATE 0.05 MG/ML IJ SOLN
25.0000 ug | INTRAMUSCULAR | Status: DC | PRN
  Administered 2012-04-01 (×2): 25 ug via INTRAVENOUS

## 2012-04-01 MED ORDER — ACETAMINOPHEN 500 MG PO TABS
1000.0000 mg | ORAL_TABLET | Freq: Once | ORAL | Status: AC
  Administered 2012-04-01: 1000 mg via ORAL
  Filled 2012-04-01: qty 2

## 2012-04-01 MED ORDER — PROPOFOL 10 MG/ML IV EMUL
INTRAVENOUS | Status: AC
  Filled 2012-04-01: qty 20

## 2012-04-01 MED ORDER — FENTANYL CITRATE 0.05 MG/ML IJ SOLN
INTRAMUSCULAR | Status: DC | PRN
  Administered 2012-04-01: 150 ug via INTRAVENOUS
  Administered 2012-04-01: 100 ug via INTRAVENOUS
  Administered 2012-04-01: 50 ug via INTRAVENOUS

## 2012-04-01 MED ORDER — VASOPRESSIN 20 UNIT/ML IJ SOLN
INTRAMUSCULAR | Status: AC
  Filled 2012-04-01: qty 1

## 2012-04-01 SURGICAL SUPPLY — 49 items
ADH SKN CLS APL DERMABOND .7 (GAUZE/BANDAGES/DRESSINGS)
ADH SKN CLS LQ APL DERMABOND (GAUZE/BANDAGES/DRESSINGS) ×1
BAG SPEC RTRVL LRG 6X4 10 (ENDOMECHANICALS) ×1
BARRIER ADHS 3X4 INTERCEED (GAUZE/BANDAGES/DRESSINGS) IMPLANT
BLADE SURG 15 STRL LF C SS BP (BLADE) ×1 IMPLANT
BLADE SURG 15 STRL SS (BLADE) ×2
BRR ADH 4X3 ABS CNTRL BYND (GAUZE/BANDAGES/DRESSINGS)
CABLE HIGH FREQUENCY MONO STRZ (ELECTRODE) IMPLANT
CHLORAPREP W/TINT 26ML (MISCELLANEOUS) ×2 IMPLANT
CLOTH BEACON ORANGE TIMEOUT ST (SAFETY) ×2 IMPLANT
COVER MAYO STAND STRL (DRAPES) IMPLANT
DERMABOND ADHESIVE PROPEN (GAUZE/BANDAGES/DRESSINGS) ×1
DERMABOND ADVANCED (GAUZE/BANDAGES/DRESSINGS)
DERMABOND ADVANCED .7 DNX12 (GAUZE/BANDAGES/DRESSINGS) IMPLANT
DERMABOND ADVANCED .7 DNX6 (GAUZE/BANDAGES/DRESSINGS) IMPLANT
ELECT NDL TIP 2.8 STRL (NEEDLE) ×1 IMPLANT
ELECT NEEDLE TIP 2.8 STRL (NEEDLE) IMPLANT
FORCEPS CUTTING 33CM 5MM (CUTTING FORCEPS) ×1 IMPLANT
GLOVE BIO SURGEON STRL SZ 6.5 (GLOVE) ×2 IMPLANT
GLOVE BIOGEL PI IND STRL 7.0 (GLOVE) ×2 IMPLANT
GLOVE BIOGEL PI INDICATOR 7.0 (GLOVE) ×3
GOWN PREVENTION PLUS LG XLONG (DISPOSABLE) ×6 IMPLANT
IV STOPCOCK 4 WAY 40  W/Y SET (IV SOLUTION)
IV STOPCOCK 4 WAY 40 W/Y SET (IV SOLUTION) IMPLANT
MANIPULATOR UTERINE 4.5 ZUMI (MISCELLANEOUS) ×1 IMPLANT
NDL INSUFFLATION 14GA 120MM (NEEDLE) IMPLANT
NEEDLE INSUFFLATION 14GA 120MM (NEEDLE) ×2 IMPLANT
PACK LAPAROSCOPY BASIN (CUSTOM PROCEDURE TRAY) ×2 IMPLANT
PENCIL BUTTON HOLSTER BLD 10FT (ELECTRODE) ×1 IMPLANT
POUCH SPECIMEN RETRIEVAL 10MM (ENDOMECHANICALS) ×1 IMPLANT
PROTECTOR NERVE ULNAR (MISCELLANEOUS) ×2 IMPLANT
SCISSORS LAP 5X35 DISP (ENDOMECHANICALS) IMPLANT
SEALER TISSUE G2 CVD JAW 35 (ENDOMECHANICALS) IMPLANT
SEALER TISSUE G2 CVD JAW 45CM (ENDOMECHANICALS)
SET IRRIG TUBING LAPAROSCOPIC (IRRIGATION / IRRIGATOR) ×1 IMPLANT
SOLUTION ELECTROLUBE (MISCELLANEOUS) IMPLANT
STENT BALLN UTERINE 3CM 6FR (Stent) IMPLANT
STENT BALLN UTERINE 4CM 6FR (STENTS) IMPLANT
SUT VICRYL 0 UR6 27IN ABS (SUTURE) ×2 IMPLANT
SUT VICRYL 4-0 PS2 18IN ABS (SUTURE) ×2 IMPLANT
SYR 50ML LL SCALE MARK (SYRINGE) IMPLANT
SYR 5ML LL (SYRINGE) ×2 IMPLANT
TOWEL OR 17X24 6PK STRL BLUE (TOWEL DISPOSABLE) ×4 IMPLANT
TRAY FOLEY CATH 14FR (SET/KITS/TRAYS/PACK) ×2 IMPLANT
TROCAR BALLN 12MMX100 BLUNT (TROCAR) IMPLANT
TROCAR XCEL NON-BLD 11X100MML (ENDOMECHANICALS) ×1 IMPLANT
TROCAR XCEL NON-BLD 5MMX100MML (ENDOMECHANICALS) ×4 IMPLANT
WARMER LAPAROSCOPE (MISCELLANEOUS) ×1 IMPLANT
WATER STERILE IRR 1000ML POUR (IV SOLUTION) ×1 IMPLANT

## 2012-04-01 NOTE — ED Notes (Signed)
Had been treated with Clomid

## 2012-04-01 NOTE — Anesthesia Procedure Notes (Signed)
Procedure Name: Intubation Performed by: Malacki Mcphearson D Pre-anesthesia Checklist: Patient identified, Emergency Drugs available, Suction available, Timeout performed and Patient being monitored Patient Re-evaluated:Patient Re-evaluated prior to inductionOxygen Delivery Method: Circle system utilized and Simple face mask Preoxygenation: Pre-oxygenation with 100% oxygen Intubation Type: IV induction, Rapid sequence and Cricoid Pressure applied Ventilation: Mask ventilation without difficulty Laryngoscope Size: Mac and 3 Grade View: Grade I Tube type: Oral Tube size: 7.0 mm Number of attempts: 1 Airway Equipment and Method: Stylet Placement Confirmation: ETT inserted through vocal cords under direct vision,  positive ETCO2,  CO2 detector and breath sounds checked- equal and bilateral Secured at: 21 cm Tube secured with: Tape

## 2012-04-01 NOTE — Anesthesia Postprocedure Evaluation (Signed)
  Anesthesia Post-op Note  Patient: Shannon Kemp  Procedure(s) Performed: Procedure(s) (LRB): LAPAROSCOPY OPERATIVE (N/A)  Patient Location: PACU  Anesthesia Type: General  Level of Consciousness: awake, alert  and oriented  Airway and Oxygen Therapy: Patient Spontanous Breathing  Post-op Pain: mild  Post-op Assessment: Post-op Vital signs reviewed, Patient's Cardiovascular Status Stable, Respiratory Function Stable, Patent Airway, No signs of Nausea or vomiting and Pain level controlled  Post-op Vital Signs: Reviewed and stable  Complications: No apparent anesthesia complications

## 2012-04-01 NOTE — ED Notes (Signed)
[facility] is at bedside

## 2012-04-01 NOTE — Brief Op Note (Signed)
04/01/2012  4:49 PM  PATIENT:  Shannon Kemp  36 y.o. female  PRE-OPERATIVE DIAGNOSIS:  ectopic pregnancy right fallopian tube  POST-OPERATIVE DIAGNOSIS:  ectopic pregnancy right fallopian tube, Fitz-Hugh-Curtis, clubbed L tube, no evidence endometriosis  PROCEDURE:  Procedure(s) (LRB): LAPAROSCOPY OPERATIVE (N/A), removal of a right fallopian tube, removal of ectopic pregnancy  SURGEON:  Surgeon(s) and Role:    * Shonique Pelphrey A. Ernestina Penna, MD - Primary  PHYSICIAN ASSISTANT:   ASSISTANTS: none   ANESTHESIA:   general  EBL:  Total I/O In: 1600 [I.V.:1600] Out: 350 [Urine:300; Blood:50]  BLOOD ADMINISTERED:none  DRAINS: none   LOCAL MEDICATIONS USED:  MARCAINE     SPECIMEN:  Source of Specimen:  Right fallopian tube and ectopic pregnancy  DISPOSITION OF SPECIMEN:  PATHOLOGY  COUNTS:  YES  TOURNIQUET:  * No tourniquets in log *  DICTATION: .Note written in EPIC  PLAN OF CARE: Discharge to home after PACU  PATIENT DISPOSITION:  PACU - hemodynamically stable.   Delay start of Pharmacological VTE agent (>24hrs) due to surgical blood loss or risk of bleeding: yes

## 2012-04-01 NOTE — H&P (Signed)
CC: ectopic pregnancy  HPI: 36 yo G4P2012 (SVD, MAB, SVD- kids 14,7) at 5-6 wks by LMP who presented to [facility] earlier today for bleeding, cramping. Evaluation and u/s c/w live ectopic pregnancy. Pt notes h/o infertility, was given1st round Clomid by NP at [facility] in [place] and positive pregnancy test 2 wks ago. Pt was unable to contact NP at [facility] and FP was following b-hcg. Pt was planning on transferring to [facility] for OB-gyn care.  Pt did 6 yrs Mirena, 77mo Depo then attempting for 1 yr. No HSG done, nl pelvic u/s and started on Clomid. No prior abd surgeries. H/o chlamydia in 2004.    PSH: spontaneous pneumothorax in 2001, broken arm PMH: cyst in knee, breast cyst Meds: PNV, tylenol All" morphine (itching, rash); (has done well w/ Ultram and Vicodin), adhesive tape  PE: Filed Vitals:   04/01/12 0817 04/01/12 1201  BP: 104/91 124/79  Pulse: 110 95  Temp: 99.1 F (37.3 C) 98.6 F (37 C)  TempSrc: Oral Oral  Resp: 18 20  Height:  5' 5.5" (1.664 m)  Weight:  65.772 kg (145 lb)  SpO2: 98% 98%    Gen: well appearing CV: RRR Pulm: CTAB Abd: soft, mildly tender, no rebound, no guarding, no deep palpation done GU: def to OR LE: NT, no edema  CBC    Component Value Date/Time   WBC 8.7 04/01/2012 1319   RBC 4.40 04/01/2012 1319   HGB 12.5 04/01/2012 1319   HCT 37.5 04/01/2012 1319   PLT 227 04/01/2012 1319   MCV 85.2 04/01/2012 1319   MCH 28.4 04/01/2012 1319   MCHC 33.3 04/01/2012 1319   RDW 14.4 04/01/2012 1319    b-hcg 5000k T/S: pending  A/P: 36 yo G4P2012 at 6 wks w/ Clomid induced ectopic pregnancy w/o evidence of uterine pregnancy - d/w pt live ectopic and recommendation for salpingectomy. Risks of bleeding, infection, damage to surrounding organs, need for laparatomy and future fertility implications d/w pt and partner. Agree to proceed.  - Infertility, will assess contralateral tube in OR, recc HSG in 77months.  Mariyah Upshaw A. 04/01/2012 1:42 PM

## 2012-04-01 NOTE — ED Notes (Signed)
Also having cramping.

## 2012-04-01 NOTE — ED Notes (Signed)
Noticed bright red bleeding this am-- after intercourse this am-- approximately [redacted] weeks pregnant. Has 8 week check-up in July (18th) at [facility]

## 2012-04-01 NOTE — ED Provider Notes (Addendum)
History     CSN: [id number]  Arrival date & time 04/01/12  0810   First MD Initiated Contact with Patient 04/01/12 0825      Chief Complaint  Patient presents with  . vag bleeding- pregnancy     (Consider location/radiation/quality/duration/timing/severity/associated sxs/prior treatment) HPI Reports vaginal bleeding and pelvic pain at suprapubic area radiating slightly to the left this morning onset one hour ago after sexual intercourse. Patient presently [redacted] weeks pregnant her dates of last menstrual period. Pain and bleeding have subsided with time, without treatment. Nothing makes symptoms better or worse. Pain sharp in quality mild at present. Patient reports blood type is O+ Past Medical History  Diagnosis Date  . Weight increase   . Fatigue   . Sleep difficulties   . Breast pain   . Nipple discharge   . Bruises easily   . Generalized headaches   . Allergy    Gravida 3 para 0 202, 2 preterm vaginal deliveries Past Surgical History  Procedure Date  . Orif ulnar fracture   . Pleural scarification     Family History  Problem Relation Age of Onset  . Hypertension Mother   . Hypertension Father     History  Substance Use Topics  . Smoking status: Former Games developer  . Smokeless tobacco: Not on file  . Alcohol Use: No    OB History    Grav Para Term Preterm Abortions TAB SAB Ect Mult Living   1               Review of Systems  Constitutional: Negative.   HENT: Negative.   Respiratory: Negative.   Cardiovascular: Negative.   Gastrointestinal: Negative.   Genitourinary:       Pregnant, vaginal bleeding, pelvic pain  Musculoskeletal: Negative.   Skin: Negative.   Neurological: Negative.   Hematological: Negative.   Psychiatric/Behavioral: Negative.   All other systems reviewed and are negative.    Allergies  Adhesive and Morphine and related  Home Medications   Current Outpatient Rx  Name Route Sig Dispense Refill  . ACETAMINOPHEN 500 MG PO TABS Oral  Take 500 mg by mouth every 6 (six) hours as needed. For pain    . PRENATAL MULTIVITAMIN CH Oral Take 1 tablet by mouth daily.      BP 104/91  Pulse 110  Temp 99.1 F (37.3 C) (Oral)  Resp 18  SpO2 98%  LMP 02/20/2012  Physical Exam  Nursing note and vitals reviewed. Constitutional: She appears well-developed and well-nourished.  HENT:  Head: Normocephalic and atraumatic.  Eyes: Conjunctivae are normal. Pupils are equal, round, and reactive to light.  Neck: Neck supple. No tracheal deviation present. No thyromegaly present.  Cardiovascular: Normal rate and regular rhythm.   No murmur heard. Pulmonary/Chest: Effort normal and breath sounds normal.  Abdominal: Soft. Bowel sounds are normal. She exhibits no distension. There is no tenderness.  Genitourinary:       No external lesion. Slight amount of blood in vault cervical os closed. No cervical motion tenderness minimal bilateral adnexal tenderness  Musculoskeletal: Normal range of motion. She exhibits no edema and no tenderness.  Neurological: She is alert. Coordination normal.  Skin: Skin is warm and dry. No rash noted.  Psychiatric: She has a normal mood and affect.    ED Course  Procedures (including critical care time) Results for orders placed during the hospital encounter of 04/01/12  URINALYSIS, ROUTINE W REFLEX MICROSCOPIC      Component Value Range  Color, Urine YELLOW  YELLOW   APPearance CLEAR  CLEAR   Specific Gravity, Urine 1.017  1.005 - 1.030   pH 6.0  5.0 - 8.0   Glucose, UA NEGATIVE  NEGATIVE mg/dL   Hgb urine dipstick LARGE (*) NEGATIVE   Bilirubin Urine NEGATIVE  NEGATIVE   Ketones, ur NEGATIVE  NEGATIVE mg/dL   Protein, ur NEGATIVE  NEGATIVE mg/dL   Urobilinogen, UA 0.2  0.0 - 1.0 mg/dL   Nitrite NEGATIVE  NEGATIVE   Leukocytes, UA SMALL (*) NEGATIVE  PREGNANCY, URINE      Component Value Range   Preg Test, Ur POSITIVE (*) NEGATIVE  HCG, QUANTITATIVE, PREGNANCY      Component Value Range   hCG,  Beta Chain, Quant, S 5777 (*) <5 mIU/mL  WET PREP, GENITAL      Component Value Range   Yeast Wet Prep HPF POC NONE SEEN  NONE SEEN   Trich, Wet Prep NONE SEEN  NONE SEEN   Clue Cells Wet Prep HPF POC NONE SEEN  NONE SEEN   WBC, Wet Prep HPF POC MANY (*) NONE SEEN  URINE MICROSCOPIC-ADD ON      Component Value Range   Squamous Epithelial / LPF RARE  RARE   WBC, UA 0-2  <3 WBC/hpf   RBC / HPF 21-50  <3 RBC/hpf   Bacteria, UA RARE  RARE   US Ob Comp Less 14 Wks  04/01/2012  *RADIOLOGY REPORT*  Clinical Data: vaginal bleeding.  Pregnancy  OBSTETRIC <14 WK ULTRASOUND, TRANSVAGINAL OB US  Technique:  Transabdominal and transvaginal ultrasound was performed for evaluation of the gestation as well as the maternal uterus and adnexal regions.  Findings:  There is a single intrauterine fluid collection.  No evidence for embryo yolk sac or cardiac activity.  Maternal uterus/adnexae:  The right ovary appears normal.  The left ovary appears normal.  No corpus luteal cyst noted.  Cystic mass within the right adnexa is identified with central fluid collection.  This contains an embryo and yolk sac.  The embryonic cardiac activity is equal to 106 beats per minute.  The crown-rump length of the embryo is 4.3 mm which corresponds to a 6-week-1-day gestation.  A small amount of free fluid is noted within the pelvis.  IMPRESSION:  1.  Complex cystic mass within the right adnexa containing embryo and yolk sac is identified and compatible with ectopic pregnancy.  These results were called by telephone on 04/01/2012   at  12:15 p.m. to  [facility], who verbally acknowledged these results.  Original Report Authenticated By: Rosealee Albee, M.D.   US Ob Transvaginal  04/01/2012  *RADIOLOGY REPORT*  Clinical Data: vaginal bleeding.  Pregnancy  OBSTETRIC <14 WK ULTRASOUND, TRANSVAGINAL OB US  Technique:  Transabdominal and transvaginal ultrasound was performed for evaluation of the gestation as well as the maternal uterus  and adnexal regions.  Findings:  There is a single intrauterine fluid collection.  No evidence for embryo yolk sac or cardiac activity.  Maternal uterus/adnexae:  The right ovary appears normal.  The left ovary appears normal.  No corpus luteal cyst noted.  Cystic mass within the right adnexa is identified with central fluid collection.  This contains an embryo and yolk sac.  The embryonic cardiac activity is equal to 106 beats per minute.  The crown-rump length of the embryo is 4.3 mm which corresponds to a 6-week-1-day gestation.  A small amount of free fluid is noted within the pelvis.  IMPRESSION:  1.  Complex cystic mass within the right adnexa containing embryo and yolk sac is identified and compatible with ectopic pregnancy.  These results were called by telephone on 04/01/2012   at  12:15 p.m. to  [facility], who verbally acknowledged these results.  Original Report Authenticated By: Rosealee Albee, M.D.     Labs Reviewed  URINALYSIS, ROUTINE W REFLEX MICROSCOPIC  PREGNANCY, URINE  HCG, QUANTITATIVE, PREGNANCY   No results found.   No diagnosis found.  ultrasound report discuss with radiologist Discussed with Dr.Foglenman  MDM  Patient stable. Dr. Algie Coffer requests transfer to [facility] hospital for surgery, as has potentially life threatening condion. Stable at present Diagnosis ectopic pregnancy    CRITICAL CARE Performed by: Doug Sou   Total critical care time: 30 minute  Critical care time was exclusive of separately billable procedures and treating other patients.  Critical care was necessary to treat or prevent imminent or life-threatening deterioration.  Critical care was time spent personally by me on the following activities: development of treatment plan with patient and/or surrogate as well as nursing, discussions with consultants, evaluation of patient's response to treatment, examination of patient, obtaining history from patient or surrogate, ordering and  performing treatments and interventions, ordering and review of laboratory studies, ordering and review of radiographic studies, pulse oximetry and re-evaluation of patient's condition.  Spoke with pt and spouse of need for surgery and transfer Doug Sou, MD 04/01/12 1228  Doug Sou, MD 04/01/12 1228

## 2012-04-01 NOTE — Transfer of Care (Signed)
Immediate Anesthesia Transfer of Care Note  Patient: Shannon Kemp  Procedure(s) Performed: Procedure(s) (LRB): LAPAROSCOPY OPERATIVE (N/A)  Patient Location: PACU  Anesthesia Type: General  Level of Consciousness: awake, alert , oriented and sedated  Airway & Oxygen Therapy: Patient Spontanous Breathing and Patient connected to nasal cannula oxygen  Post-op Assessment: Report given to PACU RN and Post -op Vital signs reviewed and stable  Post vital signs: stable  Complications: No apparent anesthesia complications

## 2012-04-01 NOTE — Discharge Instructions (Signed)

## 2012-04-01 NOTE — Anesthesia Preprocedure Evaluation (Deleted)
Anesthesia Evaluation  Patient identified by MRN, date of birth, ID band Patient awake    Reviewed: Allergy & Precautions, H&P , NPO status , Patient's Chart, lab work & pertinent test results  Airway Mallampati: III TM Distance: >3 FB Neck ROM: full    Dental No notable dental hx. (+) Teeth Intact   Pulmonary  Hx/o Spontaneous Pneumothorax S/P Thoracostomy tube in 2001 breath sounds clear to auscultation  Pulmonary exam normal       Cardiovascular negative cardio ROS  Rhythm:regular Rate:Normal     Neuro/Psych  Headaches, negative psych ROS   GI/Hepatic negative GI ROS, Neg liver ROS,   Endo/Other  negative endocrine ROS  Renal/GU negative Renal ROS  negative genitourinary   Musculoskeletal   Abdominal Normal abdominal exam  (+)   Peds  Hematology negative hematology ROS (+)   Anesthesia Other Findings   Reproductive/Obstetrics (+) Pregnancy Ectopic Pregnancy                           Anesthesia Physical Anesthesia Plan  ASA: II and Emergent  Anesthesia Plan:    Post-op Pain Management:    Induction:   Airway Management Planned:   Additional Equipment:   Intra-op Plan:   Post-operative Plan:   Informed Consent: I have reviewed the patients History and Physical, chart, labs and discussed the procedure including the risks, benefits and alternatives for the proposed anesthesia with the patient or authorized representative who has indicated his/her understanding and acceptance.   Dental Advisory Given  Plan Discussed with: Anesthesiologist, CRNA and Surgeon  Anesthesia Plan Comments:         Anesthesia Quick Evaluation

## 2012-04-01 NOTE — Anesthesia Preprocedure Evaluation (Signed)
Anesthesia Evaluation  Patient identified by MRN, date of birth, ID band Patient awake    Reviewed: Allergy & Precautions, H&P , NPO status , Patient's Chart, lab work & pertinent test results  Airway Mallampati: III TM Distance: >3 FB Neck ROM: full    Dental No notable dental hx. (+) Teeth Intact and Chipped   Pulmonary  Hx/o Spontaneous Pneumothorax S/P Thoracostomy tube in 2001 breath sounds clear to auscultation  Pulmonary exam normal       Cardiovascular negative cardio ROS  Rhythm:regular Rate:Normal     Neuro/Psych  Headaches, negative psych ROS   GI/Hepatic negative GI ROS, Neg liver ROS,   Endo/Other  negative endocrine ROS  Renal/GU negative Renal ROS  negative genitourinary   Musculoskeletal negative musculoskeletal ROS (+)   Abdominal Normal abdominal exam  (+)   Peds  Hematology negative hematology ROS (+)   Anesthesia Other Findings   Reproductive/Obstetrics (+) Pregnancy Ectopic Pregnancy                           Anesthesia Physical  Anesthesia Plan  ASA: II and Emergent  Anesthesia Plan:    Post-op Pain Management:    Induction:   Airway Management Planned:   Additional Equipment:   Intra-op Plan:   Post-operative Plan:   Informed Consent: I have reviewed the patients History and Physical, chart, labs and discussed the procedure including the risks, benefits and alternatives for the proposed anesthesia with the patient or authorized representative who has indicated his/her understanding and acceptance.   Dental Advisory Given  Plan Discussed with: Anesthesiologist, CRNA and Surgeon  Anesthesia Plan Comments:         Anesthesia Quick Evaluation

## 2012-04-01 NOTE — Op Note (Signed)
04/01/2012  4:49 PM  PATIENT:  Shannon Kemp  36 y.o. female  PRE-OPERATIVE DIAGNOSIS:  ectopic pregnancy right fallopian tube  POST-OPERATIVE DIAGNOSIS:  ectopic pregnancy right fallopian tube, Fitz-Hugh-Curtis, clubbed L tube, no evidence endometriosis  PROCEDURE:  Procedure(s) (LRB): LAPAROSCOPY OPERATIVE (N/A), removal of a right fallopian tube, removal of ectopic pregnancy  SURGEON:  Surgeon(s) and Role:    * Nataliyah Packham A. Ernestina Penna, MD - Primary  PHYSICIAN ASSISTANT:   ASSISTANTS: none   ANESTHESIA:   general  EBL:  Total I/O In: 1600 [I.V.:1600] Out: 350 [Urine:300; Blood:50]  BLOOD ADMINISTERED:none  DRAINS: none   LOCAL MEDICATIONS USED:  MARCAINE     SPECIMEN:  Source of Specimen:  Right fallopian tube and ectopic pregnancy  DISPOSITION OF SPECIMEN:  PATHOLOGY  COUNTS:  YES  TOURNIQUET:  * No tourniquets in log *  DICTATION: .Note written in EPIC  PLAN OF CARE: Discharge to home after PACU  PATIENT DISPOSITION:  PACU - hemodynamically stable.   Delay start of Pharmacological VTE agent (>24hrs) due to surgical blood loss or risk of bleeding: yes Antibiotics none Complications: None Findings: 10 cc of hemoperitoneum, normal uterus, right tube distended with ectopic within, right ovary with corpus luteum cyst, right ureter peristalsing both pre-and post salpingectomy, adhesions between the liver and the anterior abdominal wall, normal left ovary, left tube with clubbed fimbriated ends, no pelvic adhesions, no evidence of endometriosis, clean anterior and posterior cul-de-sac, no adhesions or endometriosis in the bilateral ovarian fossa.  Indications: This is a 36 year old G4 P2 012 at [redacted] weeks gestational age and history of infertility for which she was being treated with Clomid. Patient presented with pain and vaginal bleeding and had an ultrasound that was consistent with a right ectopic pregnancy with a fetal heartbeat present in the right tube. Patient was  consented for a laparoscopic right salpingectomy.  Procedure: After informed consent was obtained the patient was taken to the operating room where general anesthesia was initiated without difficulty. She was prepped and draped in the normal sterile fashion in the dorsal supine lithotomy position. A Foley catheter was inserted sterilely into the bladder. A bimanual exam was done to assess the size and position of the uterus. A speculum was inserted into the vagina. A single-tooth tenaculum was used to grasp the anterior lip of the cervix. The cervix was serially dilated to a #19 Pratt. A ZUMI uterine manipulator was then advanced into the uterus. Gloves were changed and attention was turned to the patient's abdomen.  A 10 mm incision was made in the umbilicus after first infusing with 3 cc of 1/4% Marcaine. A varies needle was then inserted into the abdomen. Intraperitoneal placement was confirmed with the use of a saline filled syringe. Pneumoperitoneum was then created to 15 mm of mercury. The #10 non-bladed trocar was then advanced into the abdomen. Intraperitoneal placement was confirmed with the laparoscope. Brief survey of the abdomen and pelvis revealed findings as above. 5 mm incisions were made in the left and right lower contents after first infusing with quarter percent Marcaine. A 5 mm non-bladed trochars were advanced under direct visualization. Using grasping instruments and the use of the uterine manipulator the entire pelvis was thoroughly evaluated. Bilateral ureters were noted. The left tube appeared clubbed but no significant adhesions around the left tube ovary. The right tube was distended in the mid isthmic portion. The proximal right fallopian tube was normal as was the fimbriated ends. A grasping instrument was used to grasp  the distal right fallopian tube. This was placed on stretch. A gyrus tripolar energy device was used to serially cauterize and transect the tube from the meso-  salpinx. I was careful to stay away from t right IP ligament. Once past the distended portion of the right fallopian tube, the gyrus device was used to come across the proximal fallopian tube. The distal fallopian tube segment was then free. This was the part of the tube that was containing the ectopic pregnancy. Hemostasis was assured along the cut edges.  The pelvis was irrigated with warmed normal saline. Instruments were changed so that a 5 mm laparoscope was coming from the right lower quadrant port an Endo Catch bag was coming from the umbilicus port and a grasping instrument was coming from the left lower quadrant port. Under direct visualization the right tubal segment was placed in the Endo Catch bag and this was then removed from the umbilicus port. After loss of pneumoperitoneum the cut edges were reevaluated and hemostasis was again assured. All ports were removed from the abdomen pneumoperitoneum was released. The umbilicus fascial incision was evaluated and felt to be very tight and that it did not need a fascial closure. The skin was closed with 4-0 Vicryl and Dermabond. The Foley catheter was removed, the ZUMI uterine manipulator was removed.  The patient tolerated the procedure well sponge lap and needle counts were correct x3 and patient was taken to the recovery room in a stable condition .  Felton Buczynski A. 04/01/2012 5:03 PM

## 2012-04-02 LAB — GC/CHLAMYDIA PROBE AMP, GENITAL: GC Probe Amp, Genital: NEGATIVE

## 2012-04-03 ENCOUNTER — Encounter (HOSPITAL_COMMUNITY): Payer: Self-pay | Admitting: Obstetrics

## 2012-06-20 ENCOUNTER — Other Ambulatory Visit (HOSPITAL_COMMUNITY): Payer: Self-pay | Admitting: Obstetrics

## 2012-06-20 DIAGNOSIS — Z8742 Personal history of other diseases of the female genital tract: Secondary | ICD-10-CM

## 2012-06-28 ENCOUNTER — Encounter (HOSPITAL_COMMUNITY): Payer: Self-pay

## 2012-06-28 ENCOUNTER — Ambulatory Visit (HOSPITAL_COMMUNITY)
Admission: RE | Admit: 2012-06-28 | Discharge: 2012-06-28 | Disposition: A | Discharge status: Home / Self Care | Payer: Private Health Insurance - Indemnity | Source: Ambulatory Visit | Attending: Obstetrics | Admitting: Obstetrics

## 2012-06-28 DIAGNOSIS — N949 Unspecified condition associated with female genital organs and menstrual cycle: Secondary | ICD-10-CM | POA: Insufficient documentation

## 2012-06-28 DIAGNOSIS — Z8742 Personal history of other diseases of the female genital tract: Secondary | ICD-10-CM | POA: Insufficient documentation

## 2012-06-28 MED ORDER — IOHEXOL 300 MG/ML  SOLN: 10.0000 mL | Freq: Once | INTRAMUSCULAR | Status: AC | PRN

## 2013-04-22 ENCOUNTER — Encounter (HOSPITAL_COMMUNITY): Payer: Self-pay | Admitting: *Deleted

## 2013-04-22 ENCOUNTER — Emergency Department (HOSPITAL_COMMUNITY)
Admission: EM | Admit: 2013-04-22 | Discharge: 2013-04-22 | Disposition: A | Discharge status: Home / Self Care | Payer: Managed Care, Other (non HMO) | Attending: Emergency Medicine | Admitting: Emergency Medicine

## 2013-04-22 DIAGNOSIS — K029 Dental caries, unspecified: Secondary | ICD-10-CM

## 2013-04-22 DIAGNOSIS — Z87891 Personal history of nicotine dependence: Secondary | ICD-10-CM | POA: Insufficient documentation

## 2013-04-22 DIAGNOSIS — Z8742 Personal history of other diseases of the female genital tract: Secondary | ICD-10-CM | POA: Insufficient documentation

## 2013-04-22 DIAGNOSIS — R599 Enlarged lymph nodes, unspecified: Secondary | ICD-10-CM | POA: Insufficient documentation

## 2013-04-22 DIAGNOSIS — H9209 Otalgia, unspecified ear: Secondary | ICD-10-CM | POA: Insufficient documentation

## 2013-04-22 MED ORDER — PENICILLIN V POTASSIUM 500 MG PO TABS: 500.0000 mg | ORAL_TABLET | Freq: Four times a day (QID) | ORAL | Status: DC

## 2013-04-22 MED ORDER — TRAMADOL HCL 50 MG PO TABS: 50.0000 mg | ORAL_TABLET | Freq: Four times a day (QID) | ORAL | Status: DC | PRN

## 2013-04-22 NOTE — ED Provider Notes (Signed)
History    CSN: [id number] Arrival date & time 04/22/13  1238  First MD Initiated Contact with Patient 04/22/13 1244     Chief Complaint  Patient presents with  . Jaw Pain   (Consider location/radiation/quality/duration/timing/severity/associated sxs/prior Treatment) HPI Pt is a 37yo female c/o intermittent lower right jaw pain that has worsened over the past 2 days. Pain is sharp and throbbing, 5/10, worse with chewing.  Occasionally radiates to right ear and right upper jaw.  States a co-worker mentioned to her that her jaw looked swollen so pt came to ED for further tx.  Reports hx of poor dental hygiene.  Has a dentist however unable to go due to financial issues.  Has tried Vicodin and tylenol last night without relief.  Does report possible new pregnancy.  Denies fever, n/v/d. Denies difficulty breathing or swallowing.    Past Medical History  Diagnosis Date  . Weight increase   . Fatigue   . Sleep difficulties   . Breast pain   . Nipple discharge   . Bruises easily   . Generalized headaches   . Allergy    Past Surgical History  Procedure Laterality Date  . Orif ulnar fracture    . Pleural scarification    . Laparoscopy  04/01/2012    Procedure: LAPAROSCOPY OPERATIVE;  Surgeon: Tresa Endo A. Ernestina Penna, MD;  Location: [facility];  Service: Gynecology;  Laterality: N/A;  Operative laparoscopy/right salpingectomy and removal of ectopic pregnancy   Family History  Problem Relation Age of Onset  . Hypertension Mother   . Hypertension Father    History  Substance Use Topics  . Smoking status: Former Games developer  . Smokeless tobacco: Never Used  . Alcohol Use: No   OB History   Grav Para Term Preterm Abortions TAB SAB Ect Mult Living   5 2 0 2 2 0 1 1 0 2      Review of Systems  Constitutional: Negative for fever and chills.  HENT: Positive for ear pain ( right) and dental problem. Negative for sore throat, trouble swallowing, neck pain, neck stiffness and voice change.   All  other systems reviewed and are negative.    Allergies  Adhesive and Morphine and related  Home Medications   Current Outpatient Rx  Name  Route  Sig  Dispense  Refill  . acetaminophen (TYLENOL) 500 MG tablet   Oral   Take 500 mg by mouth every 6 (six) hours as needed. For pain         . penicillin v potassium (VEETID) 500 MG tablet   Oral   Take 1 tablet (500 mg total) by mouth 4 (four) times daily.   40 tablet   0   . traMADol (ULTRAM) 50 MG tablet   Oral   Take 1 tablet (50 mg total) by mouth every 6 (six) hours as needed for pain (take 1-2 pills every 6-8 hours as needed for pain).   10 tablet   0    BP 118/83  Pulse 112  Temp(Src) 98.8 F (37.1 C) (Oral)  Resp 14  SpO2 99%  LMP 03/24/2013 Physical Exam  Nursing note and vitals reviewed. Constitutional: She appears well-developed and well-nourished.  HENT:  Head: Normocephalic and atraumatic. No trismus in the jaw.  Right Ear: Hearing, tympanic membrane, external ear and ear canal normal.  Left Ear: Hearing, tympanic membrane, external ear and ear canal normal.  Nose: Nose normal.  Mouth/Throat: Uvula is midline, oropharynx is clear and moist and mucous  membranes are normal. She does not have dentures. No oral lesions. Abnormal dentition. Dental caries present. No dental abscesses, edematous or lacerations. No oropharyngeal exudate.    Poor dentition.  Multiple dental caries at various stages of severity.  Pulp exposed on some teeth.  No obvious dental abscess or peritonsillar abscess.   Eyes: Conjunctivae are normal. No scleral icterus.  Neck: Normal range of motion. Neck supple.  Cardiovascular: Normal rate, regular rhythm and normal heart sounds.   Pulmonary/Chest: Effort normal and breath sounds normal. No respiratory distress. She has no wheezes. She has no rales. She exhibits no tenderness.  Musculoskeletal: Normal range of motion.  Lymphadenopathy:    She has cervical adenopathy ( anterior, right  side).  Neurological: She is alert.  Skin: Skin is warm and dry.  Psychiatric: She has a normal mood and affect. Her behavior is normal.    ED Course  Procedures (including critical care time) Labs Reviewed - No data to display No results found. 1. Pain due to dental caries     MDM  Pt has poor dentition, c/o increased right lower jaw pain.  Admits to not being able to pay bill for dentist and wants some relief.    Rx: PCN and tramadol.  Advised pt if she experiences itching, discontinue use of medication.  Pt advised to f/u with DDS. Resource guide provided. Return precautions given. Pt verbalized understanding and agreement with tx plan. Vitals: unremarkable. Discharged in stable condition.       Junius Finner, PA-C 04/23/13 1939

## 2013-04-22 NOTE — ED Notes (Signed)
Pt states for the past week she's had R sided jaw pain, states it's more bottom R side, unsure if it's her tooth or not, states has had dental problems in the past, pt also states she is 2 days late on her period and took a pregnancy test today and had a faint line saying she is pregnant. Has been taking ibuprofen for pain and took 1 Vicodin last night but did not help.

## 2013-04-25 NOTE — ED Provider Notes (Signed)
Medical screening examination/treatment/procedure(s) were performed by non-physician practitioner and as supervising physician I was immediately available for consultation/collaboration.   Tamula Morrical L Raiza Kiesel, MD 04/25/13 1445 

## 2013-05-01 ENCOUNTER — Encounter (HOSPITAL_COMMUNITY): Payer: Self-pay | Admitting: *Deleted

## 2013-05-01 ENCOUNTER — Inpatient Hospital Stay (HOSPITAL_COMMUNITY): Payer: Managed Care, Other (non HMO)

## 2013-05-01 ENCOUNTER — Inpatient Hospital Stay (HOSPITAL_COMMUNITY)
Admission: AD | Admit: 2013-05-01 | Discharge: 2013-05-01 | Disposition: A | Discharge status: Home / Self Care | Payer: Managed Care, Other (non HMO) | Source: Ambulatory Visit | Attending: Obstetrics and Gynecology | Admitting: Obstetrics and Gynecology

## 2013-05-01 DIAGNOSIS — O26859 Spotting complicating pregnancy, unspecified trimester: Secondary | ICD-10-CM | POA: Insufficient documentation

## 2013-05-01 DIAGNOSIS — O200 Threatened abortion: Secondary | ICD-10-CM

## 2013-05-01 LAB — COMPREHENSIVE METABOLIC PANEL
Albumin: 3.8 g/dL (ref 3.5–5.2)
BUN: 12 mg/dL (ref 6–23)
Calcium: 9.6 mg/dL (ref 8.4–10.5)
Creatinine, Ser: 0.61 mg/dL (ref 0.50–1.10)
GFR calc Af Amer: >90 mL/min (ref >90)
Glucose, Bld: 88 mg/dL (ref 70–99)
Potassium: 3.8 mEq/L (ref 3.5–5.1)
Total Protein: 6.5 g/dL (ref 6.0–8.3)

## 2013-05-01 LAB — WET PREP, GENITAL: Yeast Wet Prep HPF POC: NONE SEEN

## 2013-05-01 LAB — CBC
HCT: 37.6 % (ref 36.0–46.0)
MCH: 28.6 pg (ref 26.0–34.0)
MCHC: 33.8 g/dL (ref 30.0–36.0)
MCV: 84.7 fL (ref 78.0–100.0)
RDW: 14.4 % (ref 11.5–15.5)

## 2013-05-01 LAB — HCG, QUANTITATIVE, PREGNANCY: hCG, Beta Chain, Quant, S: 2035 m[IU]/mL — ABNORMAL HIGH (ref <5)

## 2013-05-01 NOTE — MAU Provider Note (Signed)
History     CSN: [id number]  Arrival date and time: 05/01/13 2036   None     Chief Complaint  Patient presents with  . Vaginal Bleeding   HPI This is a 37 y.o. female at [redacted]w[redacted]d who presents with c/o spotting for 4 days and heavier today. Dark red. Had IC Saturday.  Has had Quants done in office, last one was 1000-something, 2 days ago.  History of R fallopian tube removal for ectopic  RN Note: Pt states she had spotting since Sunday-this evening it has increased and was "dripping" when she went to the [facility]      OB History   Grav Para Term Preterm Abortions TAB SAB Ect Mult Living   5 2 0 2 2 0 1 1 0 2       Past Medical History  Diagnosis Date  . Weight increase   . Fatigue   . Sleep difficulties   . Breast pain   . Nipple discharge   . Bruises easily   . Generalized headaches   . Allergy     Past Surgical History  Procedure Laterality Date  . Orif ulnar fracture    . Pleural scarification    . Laparoscopy  04/01/2012    Procedure: LAPAROSCOPY OPERATIVE;  Surgeon: Tresa Endo A. Ernestina Penna, MD;  Location: [facility];  Service: Gynecology;  Laterality: N/A;  Operative laparoscopy/right salpingectomy and removal of ectopic pregnancy    Family History  Problem Relation Age of Onset  . Hypertension Mother   . Hypertension Father     History  Substance Use Topics  . Smoking status: Former Games developer  . Smokeless tobacco: Never Used  . Alcohol Use: No    Allergies:  Allergies  Allergen Reactions  . Adhesive (Tape)   . Morphine And Related     Prescriptions prior to admission  Medication Sig Dispense Refill  . acetaminophen (TYLENOL) 500 MG tablet Take 500 mg by mouth every 6 (six) hours as needed. For pain      . penicillin v potassium (VEETID) 500 MG tablet Take 1 tablet (500 mg total) by mouth 4 (four) times daily.  40 tablet  0  . traMADol (ULTRAM) 50 MG tablet Take 1 tablet (50 mg total) by mouth every 6 (six) hours as needed for pain (take 1-2 pills every 6-8 hours  as needed for pain).  10 tablet  0    Review of Systems  Constitutional: Negative for fever, chills and malaise/fatigue.  Gastrointestinal: Negative for nausea, vomiting, abdominal pain, diarrhea and constipation.       Vaginal bleeding  Genitourinary: Negative for dysuria.  Neurological: Negative for dizziness.   Physical Exam   Blood pressure 119/84, pulse 100, temperature 98.5 F (36.9 C), temperature source Oral, resp. rate 16, height 5\' 5"  (1.651 m), weight 66.821 kg (147 lb 5 oz), last menstrual period 03/24/2013, SpO2 100.00%.  Physical Exam  Constitutional: She is oriented to person, place, and time. She appears well-developed and well-nourished. No distress.  Cardiovascular: Normal rate.   Respiratory: Effort normal.  GI: Soft. She exhibits no distension and no mass. There is no tenderness. There is no rebound and no guarding.  Genitourinary: Uterus normal. Vaginal discharge (moderate dark red blood, cervix closed and long, adnexae nontender) found.  Uterus small, 4-5 wk size  Musculoskeletal: Normal range of motion.  Neurological: She is alert and oriented to person, place, and time.  Skin: Skin is warm and dry.  Psychiatric: She has a normal mood and affect.  MAU Course  Procedures  MDM Results for orders placed during the hospital encounter of 05/01/13 (from the past 24 hour(s))  HCG, QUANTITATIVE, PREGNANCY     Status: Abnormal   Collection Time    05/01/13  9:20 PM      Result Value Range   hCG, Beta Chain, Quant, S 2035 (*) <5 mIU/mL  CBC     Status: None   Collection Time    05/01/13  9:20 PM      Result Value Range   WBC 9.9  4.0 - 10.5 K/uL   RBC 4.44  3.87 - 5.11 MIL/uL   Hemoglobin 12.7  12.0 - 15.0 g/dL   HCT 40.9  81.1 - 91.4 %   MCV 84.7  78.0 - 100.0 fL   MCH 28.6  26.0 - 34.0 pg   MCHC 33.8  30.0 - 36.0 g/dL   RDW 78.2  95.6 - 21.3 %   Platelets 239  150 - 400 K/uL  COMPREHENSIVE METABOLIC PANEL     Status: Abnormal   Collection Time     05/01/13  9:20 PM      Result Value Range   Sodium 136  135 - 145 mEq/L   Potassium 3.8  3.5 - 5.1 mEq/L   Chloride 104  96 - 112 mEq/L   CO2 28  19 - 32 mEq/L   Glucose, Bld 88  70 - 99 mg/dL   BUN 12  6 - 23 mg/dL   Creatinine, Ser 0.86  0.50 - 1.10 mg/dL   Calcium 9.6  8.4 - 57.8 mg/dL   Total Protein 6.5  6.0 - 8.3 g/dL   Albumin 3.8  3.5 - 5.2 g/dL   AST 10  0 - 37 U/L   ALT 12  0 - 35 U/L   Alkaline Phosphatase 48  39 - 117 U/L   Total Bilirubin 0.2 (*) 0.3 - 1.2 mg/dL   GFR calc non Af Amer >90  >90 mL/min   GFR calc Af Amer >90  >90 mL/min  WET PREP, GENITAL     Status: Abnormal   Collection Time    05/01/13  9:40 PM      Result Value Range   Yeast Wet Prep HPF POC NONE SEEN  NONE SEEN   Trich, Wet Prep NONE SEEN  NONE SEEN   Clue Cells Wet Prep HPF POC FEW (*) NONE SEEN   WBC, Wet Prep HPF POC FEW (*) NONE SEEN   US Ob Transvaginal  05/01/2013   *RADIOLOGY REPORT*  Clinical Data: Vaginal bleeding during early pregnancy with quantitative beta HCG level of 2035.  Based on last menstrual period estimated gestational age of pregnancy is 5 weeks and 3 days.  OBSTETRIC <14 WK [place] AND TRANSVAGINAL OB US  Technique:  Both transabdominal and transvaginal ultrasound examinations were performed for complete evaluation of the gestation as well as the maternal uterus, adnexal regions, and pelvic cul-de-sac.  Transvaginal technique was performed to assess early pregnancy.  Comparison:  None.  Intrauterine gestational sac:  No intrauterine gestational sac is identified. Yolk sac: n/a Embryo: n/a Cardiac Activity: n/a Heart Rate: n/a bpm  Maternal uterus/adnexae: No adnexal masses are identified.  No free fluid is identified. Probable corpus luteal cyst of the left ovary measures 1.2 cm.  IMPRESSION: No intrauterine or adnexal gestation is identified.   Original Report Authenticated By: Irish Lack, M.D.    Assessment and Plan  A:  Pregnancy at [redacted]w[redacted]d  Quants doubled      Nothing seen  in uterus on [place]      L CLC  P: Discussed with Dr Arelia Sneddon      Pt to call office in AM to see what further followup is desired by primary      Bleeding precautions      Parents are hopeful I told them we don't know anything for sure yet.  Wynelle Bourgeois 05/01/2013, 9:04 PM

## 2013-05-01 NOTE — MAU Note (Signed)
Pt states she had spotting since Sunday-this evening it has increased and was "dripping" when she went to the [facility]

## 2013-05-02 LAB — GC/CHLAMYDIA PROBE AMP: GC Probe RNA: NEGATIVE

## 2013-05-08 NOTE — H&P (Signed)
Shannon Kemp is an 37 y.o. female with hx of right ectopic pregnancy and salpingectomy in 2013 and new dx of left ectopic pregnancy with +FHT measuring 6 week sized; no fluid in culdesac.  +VB but none currently and no pain.  Pertinent Gynecological History: Menses: flow is light Bleeding: pregnant Contraception: none DES exposure: unknown Blood transfusions: none Sexually transmitted diseases: past history: Chlamydia Previous GYN Procedures: l/s right salpingectomy  Last mammogram: normal Date: 2012 Last pap: normal Date: 2012 OB History: G5, P0222   Menstrual History: Menarche age: n/a Patient's last menstrual period was 03/24/2013.    Past Medical History  Diagnosis Date  . Weight increase   . Fatigue   . Sleep difficulties   . Breast pain   . Nipple discharge   . Bruises easily   . Generalized headaches   . Allergy     Past Surgical History  Procedure Laterality Date  . Orif ulnar fracture    . Pleural scarification    . Laparoscopy  04/01/2012    Procedure: LAPAROSCOPY OPERATIVE;  Surgeon: Tresa Endo A. Ernestina Penna, MD;  Location: [facility];  Service: Gynecology;  Laterality: N/A;  Operative laparoscopy/right salpingectomy and removal of ectopic pregnancy    Family History  Problem Relation Age of Onset  . Hypertension Mother   . Hypertension Father     Social History:  reports that she has quit smoking. She has never used smokeless tobacco. She reports that she does not drink alcohol or use illicit drugs.  Allergies:  Allergies  Allergen Reactions  . Morphine And Related Itching and Other (See Comments)    hallucinations  . Adhesive (Tape) Rash    No prescriptions prior to admission    Review of Systems  Constitutional: Negative for fever and chills.  Gastrointestinal: Negative for nausea and abdominal pain.    Height 5\' 5"  (1.651 m), weight 65.318 kg (144 lb), last menstrual period 03/24/2013. Physical Exam  Constitutional: She is oriented to person,  place, and time. She appears well-developed and well-nourished.  Cardiovascular: Normal rate and regular rhythm.   Respiratory: Effort normal and breath sounds normal.  GI: Soft. There is no rebound and no guarding.  Neurological: She is alert and oriented to person, place, and time.  Skin: Skin is warm and dry.  Psychiatric: She has a normal mood and affect. Her behavior is normal.    No results found for this or any previous visit (from the past 24 hour(s)).  No results found.  Assessment/Plan: 37 yo with left ectopic pregnancy -l/s left salpingectomy  Shannon Kemp 05/08/2013, 8:11 PM

## 2013-05-09 ENCOUNTER — Ambulatory Visit (HOSPITAL_COMMUNITY): Payer: Managed Care, Other (non HMO) | Admitting: Anesthesiology

## 2013-05-09 ENCOUNTER — Encounter (HOSPITAL_COMMUNITY): Payer: Self-pay | Admitting: Anesthesiology

## 2013-05-09 ENCOUNTER — Encounter (HOSPITAL_COMMUNITY)
Admission: AD | Disposition: A | Discharge status: Home / Self Care | Payer: Self-pay | Source: Ambulatory Visit | Attending: Obstetrics & Gynecology

## 2013-05-09 ENCOUNTER — Encounter (HOSPITAL_COMMUNITY): Payer: Self-pay | Admitting: *Deleted

## 2013-05-09 ENCOUNTER — Encounter (HOSPITAL_COMMUNITY): Payer: Self-pay | Admitting: Pharmacist

## 2013-05-09 ENCOUNTER — Ambulatory Visit (HOSPITAL_COMMUNITY)
Admission: AD | Admit: 2013-05-09 | Discharge: 2013-05-09 | Disposition: A | Discharge status: Home / Self Care | Payer: Managed Care, Other (non HMO) | Source: Ambulatory Visit | Attending: Obstetrics & Gynecology | Admitting: Obstetrics & Gynecology

## 2013-05-09 DIAGNOSIS — O00109 Unspecified tubal pregnancy without intrauterine pregnancy: Secondary | ICD-10-CM | POA: Insufficient documentation

## 2013-05-09 HISTORY — PX: LAPAROSCOPY: SHX197

## 2013-05-09 LAB — CBC
MCHC: 34.5 g/dL (ref 30.0–36.0)
Platelets: 223 10*3/uL (ref 150–400)
RDW: 14.2 % (ref 11.5–15.5)

## 2013-05-09 SURGERY — LAPAROSCOPY OPERATIVE
Anesthesia: General | Site: Abdomen | Laterality: Left | Wound class: Clean Contaminated

## 2013-05-09 MED ORDER — ONDANSETRON HCL 4 MG/2ML IJ SOLN
INTRAMUSCULAR | Status: AC
  Filled 2013-05-09: qty 2

## 2013-05-09 MED ORDER — LACTATED RINGERS IV SOLN
INTRAVENOUS | Status: DC
  Administered 2013-05-09 (×2): via INTRAVENOUS

## 2013-05-09 MED ORDER — NEOSTIGMINE METHYLSULFATE 1 MG/ML IJ SOLN
INTRAMUSCULAR | Status: DC | PRN
  Administered 2013-05-09: 2.5 mg via INTRAVENOUS

## 2013-05-09 MED ORDER — BUPIVACAINE HCL (PF) 0.25 % IJ SOLN
INTRAMUSCULAR | Status: AC
  Filled 2013-05-09: qty 30

## 2013-05-09 MED ORDER — GLYCOPYRROLATE 0.2 MG/ML IJ SOLN
INTRAMUSCULAR | Status: DC | PRN
  Administered 2013-05-09: 0.2 mg via INTRAVENOUS
  Administered 2013-05-09: .5 mg via INTRAVENOUS

## 2013-05-09 MED ORDER — PROPOFOL 10 MG/ML IV BOLUS
INTRAVENOUS | Status: DC | PRN
  Administered 2013-05-09: 150 mg via INTRAVENOUS

## 2013-05-09 MED ORDER — KETOROLAC TROMETHAMINE 30 MG/ML IJ SOLN: 15.0000 mg | Freq: Once | INTRAMUSCULAR | Status: DC | PRN

## 2013-05-09 MED ORDER — FENTANYL CITRATE 0.05 MG/ML IJ SOLN
INTRAMUSCULAR | Status: DC | PRN
  Administered 2013-05-09 (×2): 50 ug via INTRAVENOUS
  Administered 2013-05-09: 100 ug via INTRAVENOUS
  Administered 2013-05-09: 50 ug via INTRAVENOUS

## 2013-05-09 MED ORDER — LIDOCAINE HCL (CARDIAC) 20 MG/ML IV SOLN
INTRAVENOUS | Status: AC
  Filled 2013-05-09: qty 5

## 2013-05-09 MED ORDER — FENTANYL CITRATE 0.05 MG/ML IJ SOLN
INTRAMUSCULAR | Status: AC
  Filled 2013-05-09: qty 5

## 2013-05-09 MED ORDER — ONDANSETRON HCL 4 MG/2ML IJ SOLN
INTRAMUSCULAR | Status: DC | PRN
  Administered 2013-05-09: 4 mg via INTRAVENOUS

## 2013-05-09 MED ORDER — ROCURONIUM BROMIDE 50 MG/5ML IV SOLN
INTRAVENOUS | Status: AC
  Filled 2013-05-09: qty 1

## 2013-05-09 MED ORDER — IBUPROFEN 600 MG PO TABS: 600.0000 mg | ORAL_TABLET | Freq: Four times a day (QID) | ORAL | Status: DC | PRN

## 2013-05-09 MED ORDER — KETOROLAC TROMETHAMINE 30 MG/ML IJ SOLN
INTRAMUSCULAR | Status: AC
  Filled 2013-05-09: qty 1

## 2013-05-09 MED ORDER — LIDOCAINE HCL (CARDIAC) 20 MG/ML IV SOLN
INTRAVENOUS | Status: DC | PRN
  Administered 2013-05-09: 60 mg via INTRAVENOUS

## 2013-05-09 MED ORDER — BUPIVACAINE HCL (PF) 0.25 % IJ SOLN
INTRAMUSCULAR | Status: DC | PRN
  Administered 2013-05-09: 2 mL

## 2013-05-09 MED ORDER — KETOROLAC TROMETHAMINE 30 MG/ML IJ SOLN
INTRAMUSCULAR | Status: DC | PRN
  Administered 2013-05-09: 30 mg via INTRAVENOUS

## 2013-05-09 MED ORDER — FENTANYL CITRATE 0.05 MG/ML IJ SOLN: 25.0000 ug | INTRAMUSCULAR | Status: DC | PRN

## 2013-05-09 MED ORDER — OXYCODONE-ACETAMINOPHEN 7.5-325 MG PO TABS: 1.0000 | ORAL_TABLET | ORAL | Status: DC | PRN

## 2013-05-09 MED ORDER — DEXTROSE IN LACTATED RINGERS 5 % IV SOLN: INTRAVENOUS | Status: DC

## 2013-05-09 MED ORDER — SODIUM CHLORIDE 0.9 % IJ SOLN
INTRAMUSCULAR | Status: DC | PRN
  Administered 2013-05-09: 3 mL via INTRAVENOUS

## 2013-05-09 MED ORDER — DEXAMETHASONE SODIUM PHOSPHATE 10 MG/ML IJ SOLN
INTRAMUSCULAR | Status: DC | PRN
  Administered 2013-05-09: 10 mg via INTRAVENOUS

## 2013-05-09 MED ORDER — HYDROCODONE-ACETAMINOPHEN 5-325 MG PO TABS
1.0000 | ORAL_TABLET | Freq: Once | ORAL | Status: AC
  Administered 2013-05-09: 1 via ORAL

## 2013-05-09 MED ORDER — PROPOFOL 10 MG/ML IV EMUL
INTRAVENOUS | Status: AC
  Filled 2013-05-09: qty 20

## 2013-05-09 MED ORDER — ROCURONIUM BROMIDE 100 MG/10ML IV SOLN
INTRAVENOUS | Status: DC | PRN
  Administered 2013-05-09: 25 mg via INTRAVENOUS
  Administered 2013-05-09: 5 mg via INTRAVENOUS

## 2013-05-09 MED ORDER — MIDAZOLAM HCL 2 MG/2ML IJ SOLN
INTRAMUSCULAR | Status: AC
  Filled 2013-05-09: qty 2

## 2013-05-09 MED ORDER — MIDAZOLAM HCL 5 MG/5ML IJ SOLN
INTRAMUSCULAR | Status: DC | PRN
  Administered 2013-05-09: 2 mg via INTRAVENOUS

## 2013-05-09 MED ORDER — HYDROCODONE-ACETAMINOPHEN 5-325 MG PO TABS
ORAL_TABLET | ORAL | Status: AC
  Filled 2013-05-09: qty 1

## 2013-05-09 SURGICAL SUPPLY — 34 items
ADH SKN CLS APL DERMABOND .7 (GAUZE/BANDAGES/DRESSINGS) ×2
BAG SPEC RTRVL LRG 6X4 10 (ENDOMECHANICALS) ×2
BARRIER ADHS 3X4 INTERCEED (GAUZE/BANDAGES/DRESSINGS) IMPLANT
BRR ADH 4X3 ABS CNTRL BYND (GAUZE/BANDAGES/DRESSINGS)
CABLE HIGH FREQUENCY MONO STRZ (ELECTRODE) IMPLANT
CATH ROBINSON RED A/P 16FR (CATHETERS) ×3 IMPLANT
CHLORAPREP W/TINT 26ML (MISCELLANEOUS) ×3 IMPLANT
CLOTH BEACON ORANGE TIMEOUT ST (SAFETY) ×3 IMPLANT
DERMABOND ADVANCED (GAUZE/BANDAGES/DRESSINGS) ×1
DERMABOND ADVANCED .7 DNX12 (GAUZE/BANDAGES/DRESSINGS) ×2 IMPLANT
ENSEAL DEVICE STD TIP 35CM (ENDOMECHANICALS) IMPLANT
FORCEPS CUTTING 33CM 5MM (CUTTING FORCEPS) ×2 IMPLANT
GLOVE BIO SURGEON STRL SZ 6 (GLOVE) ×3 IMPLANT
GLOVE BIOGEL PI IND STRL 6 (GLOVE) ×4 IMPLANT
GLOVE BIOGEL PI INDICATOR 6 (GLOVE) ×2
GOWN PREVENTION PLUS LG XLONG (DISPOSABLE) ×9 IMPLANT
NEEDLE GYRUS 33CM (NEEDLE) ×2 IMPLANT
NEEDLE INSUFFLATION 120MM (ENDOMECHANICALS) ×3 IMPLANT
NS IRRIG 1000ML POUR BTL (IV SOLUTION) ×3 IMPLANT
PACK LAPAROSCOPY BASIN (CUSTOM PROCEDURE TRAY) ×3 IMPLANT
POUCH SPECIMEN RETRIEVAL 10MM (ENDOMECHANICALS) ×2 IMPLANT
PROTECTOR NERVE ULNAR (MISCELLANEOUS) ×3 IMPLANT
SEALER TISSUE G2 CVD JAW 45CM (ENDOMECHANICALS) IMPLANT
SET IRRIG TUBING LAPAROSCOPIC (IRRIGATION / IRRIGATOR) IMPLANT
SPONGE GAUZE 2X2 8PLY STRL LF (GAUZE/BANDAGES/DRESSINGS) ×2 IMPLANT
STRIP CLOSURE SKIN 1/2X4 (GAUZE/BANDAGES/DRESSINGS) IMPLANT
SUT MNCRL AB 3-0 PS2 27 (SUTURE) ×3 IMPLANT
SUT VICRYL 0 UR6 27IN ABS (SUTURE) ×3 IMPLANT
TAPE PAPER 2X10 WHT MICROPORE (GAUZE/BANDAGES/DRESSINGS) ×2 IMPLANT
TOWEL OR 17X24 6PK STRL BLUE (TOWEL DISPOSABLE) ×6 IMPLANT
TROCAR XCEL NON-BLD 11X100MML (ENDOMECHANICALS) ×3 IMPLANT
TROCAR XCEL NON-BLD 5MMX100MML (ENDOMECHANICALS) ×4 IMPLANT
WARMER LAPAROSCOPE (MISCELLANEOUS) ×3 IMPLANT
WATER STERILE IRR 1000ML POUR (IV SOLUTION) ×3 IMPLANT

## 2013-05-09 NOTE — Anesthesia Procedure Notes (Signed)
Date/Time: 05/09/2013 1:32 PM Performed by: Graciela Husbands Pre-anesthesia Checklist: Patient identified, Patient being monitored, Emergency Drugs available, Timeout performed and Suction available Patient Re-evaluated:Patient Re-evaluated prior to inductionOxygen Delivery Method: Circle system utilized Preoxygenation: Pre-oxygenation with 100% oxygen Intubation Type: IV induction Ventilation: Mask ventilation without difficulty Laryngoscope Size: Mac and 3 Grade View: Grade I Number of attempts: 1 Placement Confirmation: ETT inserted through vocal cords under direct vision,  breath sounds checked- equal and bilateral and positive ETCO2 Secured at: 21 cm Dental Injury: Teeth and Oropharynx as per pre-operative assessment

## 2013-05-09 NOTE — Progress Notes (Signed)
No change to H&P. 

## 2013-05-09 NOTE — Transfer of Care (Signed)
Immediate Anesthesia Transfer of Care Note  Patient: Shannon Kemp  Procedure(s) Performed: Procedure(s) with comments: LAPAROSCOPY OPERATIVE/ECTOPIC (Left) - WITH LEFT SALPINGECTOMY BILATERAL SALPINGECTOMY (Left)  Patient Location: PACU  Anesthesia Type:General  Level of Consciousness: awake, alert  and oriented  Airway & Oxygen Therapy: Patient Spontanous Breathing and Patient connected to nasal cannula oxygen  Post-op Assessment: Report given to PACU RN and Post -op Vital signs reviewed and stable  Post vital signs: Reviewed and stable  Complications: No apparent anesthesia complications

## 2013-05-09 NOTE — Anesthesia Preprocedure Evaluation (Signed)
Anesthesia Evaluation  Patient identified by MRN, date of birth, ID band Patient awake    Reviewed: Allergy & Precautions, H&P , NPO status , Patient's Chart, lab work & pertinent test results, reviewed documented beta blocker date and time   History of Anesthesia Complications Negative for: history of anesthetic complications  Airway Mallampati: II TM Distance: >3 FB Neck ROM: full    Dental  (+) Poor Dentition   Pulmonary former smoker,  breath sounds clear to auscultation        Cardiovascular negative cardio ROS  Rhythm:regular Rate:Normal     Neuro/Psych  Headaches (occasional - last one week ago), negative psych ROS   GI/Hepatic Neg liver ROS, GERD- (with pregnancy)  ,  Endo/Other  negative endocrine ROS  Renal/GU negative Renal ROS  negative genitourinary   Musculoskeletal   Abdominal   Peds  Hematology negative hematology ROS (+)   Anesthesia Other Findings   Reproductive/Obstetrics (+) Pregnancy (ectopic, 6 weeks, +FHR)                           Anesthesia Physical Anesthesia Plan  ASA: II  Anesthesia Plan: General ETT   Post-op Pain Management:    Induction:   Airway Management Planned:   Additional Equipment:   Intra-op Plan:   Post-operative Plan:   Informed Consent: I have reviewed the patients History and Physical, chart, labs and discussed the procedure including the risks, benefits and alternatives for the proposed anesthesia with the patient or authorized representative who has indicated his/her understanding and acceptance.     Plan Discussed with: Surgeon and CRNA  Anesthesia Plan Comments:         Anesthesia Quick Evaluation

## 2013-05-10 NOTE — Anesthesia Postprocedure Evaluation (Signed)
  Anesthesia Post-op Note  Patient: Shannon Kemp  Procedure(s) Performed: Procedure(s) with comments: LAPAROSCOPY OPERATIVE/ECTOPIC (Left) - WITH LEFT SALPINGECTOMY Patient is awake and responsive. Pain and nausea are reasonably well controlled. Vital signs are stable and clinically acceptable. Oxygen saturation is clinically acceptable. There are no apparent anesthetic complications at this time. Patient is ready for discharge.

## 2013-05-10 NOTE — Op Note (Signed)
Shannon Kemp PROCEDURE DATE: 05/09/2013  PREOPERATIVE DIAGNOSIS: Left ectopic pregnancy POSTOPERATIVE DIAGNOSIS: Unruptured left fallopian tube ectopic pregnancy PROCEDURE: Laparoscopic left salpingectomy and removal of ectopic pregnancy SURGEON:  Dr. Mitchel Honour  INDICATIONS: [name] at [redacted]w[redacted]d here with left ectopic pregnancy. On exam, she had stable vital signs, and no acute abdomen. Hgb 13.6, blood type O pos. Patient was counseled regarding need for laparoscopic salpingectomy. Risks of surgery including bleeding which may require transfusion or reoperation, infection, injury to bowel or other surrounding organs, need for additional procedures including laparotomy and other postoperative/anesthesia complications were explained to patient.  Written informed consent was obtained.  FINDINGS:  No hemoperitoneum.  Dilated left fallopian tube containing ectopic gestation. Small normal appearing uterus, surgically absent right fallopian tube, normal right and left ovaries.  ANESTHESIA: General ESTIMATED BLOOD LOSS: 25 ml SPECIMENS: left fallopian tube containing ectopic gestation COMPLICATIONS: None immediate  PROCEDURE IN DETAIL:  The patient was taken to the operating room where general anesthesia was administered and was found to be adequate.  She was placed in the dorsal lithotomy position, and was prepped and draped in a sterile manner.  A Foley catheter was inserted into her bladder and attached to constant drainage and a uterine manipulator was then advanced into the uterus .  After an adequate timeout was performed, attention was then turned to the patient's abdomen where a 10-mm skin incision was made on the umbilical fold.  The Veress needle was carefully introduced into the peritoneal cavity at a 45-degree angle into the abdominal wall.  Intraperitoneal placement was confirmed by saline drop test.  Adequate pneumoperitoneum was obtained, and the 10-mm trocar and sleeve were then  advanced without difficulty into the abdomen where intraabdominal placement was confirmed by the laparoscope. A survey of the patient's pelvis and abdomen revealed the findings as above. 5-mm trocar was placed on the right under direct visualization.  The left fallopian tube was grasped and ligated from the underlying mesosalpinx and uterine attachment using the Gyrus instrument.  Good hemostasis was noted.  The specimen was placed in an EndoCatch bag and removed from the abdomen intact.  The abdomen was desufflated, and all instruments were removed.  The fascial incisions of both 10-mm sites were reapproximated with 0 Vicryl figure-of-eight stiches; and all skin incisions were closed with a 3-0 Vicryl subcuticular stitch. The patient tolerated the procedures well.  All instruments, needles, and sponge counts were correct x 2. The patient was taken to the recovery room in stable condition.    Florencio Hollibaugh 05/10/2013 6:40 AM

## 2014-03-06 ENCOUNTER — Encounter (HOSPITAL_COMMUNITY): Payer: Self-pay | Admitting: *Deleted

## 2014-08-04 ENCOUNTER — Encounter (HOSPITAL_COMMUNITY): Payer: Self-pay | Admitting: *Deleted

## 2014-12-22 ENCOUNTER — Emergency Department (HOSPITAL_COMMUNITY)
Admission: EM | Admit: 2014-12-22 | Discharge: 2014-12-22 | Disposition: A | Discharge status: Home / Self Care | Payer: Managed Care, Other (non HMO) | Attending: Emergency Medicine | Admitting: Emergency Medicine

## 2014-12-22 ENCOUNTER — Encounter (HOSPITAL_COMMUNITY): Payer: Self-pay | Admitting: Emergency Medicine

## 2014-12-22 DIAGNOSIS — Z8669 Personal history of other diseases of the nervous system and sense organs: Secondary | ICD-10-CM | POA: Diagnosis not present

## 2014-12-22 DIAGNOSIS — M79604 Pain in right leg: Secondary | ICD-10-CM | POA: Insufficient documentation

## 2014-12-22 DIAGNOSIS — Z791 Long term (current) use of non-steroidal anti-inflammatories (NSAID): Secondary | ICD-10-CM | POA: Insufficient documentation

## 2014-12-22 DIAGNOSIS — M79605 Pain in left leg: Secondary | ICD-10-CM | POA: Insufficient documentation

## 2014-12-22 DIAGNOSIS — Z91040 Latex allergy status: Secondary | ICD-10-CM | POA: Insufficient documentation

## 2014-12-22 DIAGNOSIS — Z79899 Other long term (current) drug therapy: Secondary | ICD-10-CM | POA: Diagnosis not present

## 2014-12-22 DIAGNOSIS — Z8742 Personal history of other diseases of the female genital tract: Secondary | ICD-10-CM | POA: Insufficient documentation

## 2014-12-22 DIAGNOSIS — Z792 Long term (current) use of antibiotics: Secondary | ICD-10-CM | POA: Diagnosis not present

## 2014-12-22 DIAGNOSIS — Z87891 Personal history of nicotine dependence: Secondary | ICD-10-CM | POA: Diagnosis not present

## 2014-12-22 LAB — CBC WITH DIFFERENTIAL/PLATELET
BASOS PCT: 1 % (ref 0–1)
Basophils Absolute: 0.1 10*3/uL (ref 0.0–0.1)
EOS ABS: 0.1 10*3/uL (ref 0.0–0.7)
Eosinophils Relative: 2 % (ref 0–5)
HEMATOCRIT: 42.5 % (ref 36.0–46.0)
HEMOGLOBIN: 13.9 g/dL (ref 12.0–15.0)
LYMPHS ABS: 1.8 10*3/uL (ref 0.7–4.0)
LYMPHS PCT: 25 % (ref 12–46)
MCH: 28.3 pg (ref 26.0–34.0)
MCHC: 32.7 g/dL (ref 30.0–36.0)
MCV: 86.4 fL (ref 78.0–100.0)
MONO ABS: 0.6 10*3/uL (ref 0.1–1.0)
MONOS PCT: 9 % (ref 3–12)
Neutro Abs: 4.6 10*3/uL (ref 1.7–7.7)
Neutrophils Relative %: 63 % (ref 43–77)
Platelets: 210 10*3/uL (ref 150–400)
RBC: 4.92 MIL/uL (ref 3.87–5.11)
RDW: 14.2 % (ref 11.5–15.5)
WBC: 7.1 10*3/uL (ref 4.0–10.5)

## 2014-12-22 LAB — I-STAT CHEM 8, ED
BUN: 12 mg/dL (ref 6–23)
CALCIUM ION: 1.18 mmol/L (ref 1.12–1.23)
CHLORIDE: 108 mmol/L (ref 96–112)
Creatinine, Ser: 0.6 mg/dL (ref 0.50–1.10)
GLUCOSE: 93 mg/dL (ref 70–99)
HCT: 41 % (ref 36.0–46.0)
Hemoglobin: 13.9 g/dL (ref 12.0–15.0)
Potassium: 4.2 mmol/L (ref 3.5–5.1)
Sodium: 142 mmol/L (ref 135–145)
TCO2: 19 mmol/L (ref 0–100)

## 2014-12-22 LAB — I-STAT BETA HCG BLOOD, ED (MC, WL, AP ONLY): I-stat hCG, quantitative: <5 m[IU]/mL (ref <5)

## 2014-12-22 LAB — D-DIMER, QUANTITATIVE (NOT AT ARMC): D DIMER QUANT: 0.4 ug{FEU}/mL (ref 0.00–0.48)

## 2014-12-22 MED ORDER — ACETAMINOPHEN 325 MG PO TABS
650.0000 mg | ORAL_TABLET | Freq: Once | ORAL | Status: AC
  Administered 2014-12-22: 650 mg via ORAL
  Filled 2014-12-22: qty 2

## 2014-12-22 NOTE — ED Notes (Signed)
Pt c/o bilat leg pain that started on Friday after pt got home from work.  Pt states that she was sitting [place] style on the couch and then later her legs felt numb or like the circulation was cut off.  Pt states that at work her legs were hurting badly and states pain is worse if she was standing still vs walking around.  Pt states that last night she couldn't get a good night sleep due to the pain in her legs.  Pt states that she tried having her husband massage them but the touch hurt.

## 2014-12-22 NOTE — Discharge Instructions (Signed)
For pain control you may take:  800mg of ibuprofen (that is usually 4 over the counter pills)  3 times a day (take with food) and acetaminophen 975mg (this is 3 over the counter pills) four times a day. Do not drink alcohol or combine with other medications that have acetaminophen as an ingredient (Read the labels!).  For breakthrough pain you may take Tramadol. Do not drink alcohol drive or operate heavy machinery when taking Tramadol. ° °Please follow with your primary care doctor in the next 2 days for a check-up. They must obtain records for further management.  ° °Do not hesitate to return to the Emergency Department for any new, worsening or concerning symptoms.  ° °

## 2014-12-22 NOTE — ED Provider Notes (Signed)
CSN: [id number]     Arrival date & time 12/22/14  8299 History   First MD Initiated Contact with Patient 12/22/14 [phone]     Chief Complaint  Patient presents with  . Leg Pain     (Consider location/radiation/quality/duration/timing/severity/associated sxs/prior Treatment) HPI  Shannon Kemp is a 39 y.o. female  complaining of bilateral leg pain onset 3 days ago after patient was sitting crosslegged, [place] style for 30 minutes. States that the pain has been intermittent since then, states that she is more bothered at night while she is sleeping, occasionally it wakes her from sleep. She rates her pain at 6 out of 10, states that is worsened at night. She's taking Motrin at home with little relief. She denies exogenous estrogen, swelling, chest pain above her baseline, she states that she always has a left shoulder pain, he also has chronic shortness of breath which she states may be getting worse over the last couple of weeks. States she is chronically tachycardic, she has worn a Holter monitor which did not reveal any arrhythmia. palpitations. No recent excessive physical exertion recently. Pain is exacerbated by standing still, not walking   Past Medical History  Diagnosis Date  . Weight increase   . Fatigue   . Sleep difficulties   . Breast pain   . Nipple discharge   . Bruises easily   . Generalized headaches   . Allergy    Past Surgical History  Procedure Laterality Date  . Orif ulnar fracture    . Pleural scarification    . Laparoscopy  04/01/2012    Procedure: LAPAROSCOPY OPERATIVE;  Surgeon: Claiborne Billings A. Pamala Hurry, MD;  Location: [facility];  Service: Gynecology;  Laterality: N/A;  Operative laparoscopy/right salpingectomy and removal of ectopic pregnancy  . Laparoscopy Left 05/09/2013    Procedure: LAPAROSCOPY OPERATIVE/ECTOPIC;  Surgeon: Linda Hedges, DO;  Location: [facility];  Service: Gynecology;  Laterality: Left;  WITH LEFT SALPINGECTOMY   Family History  Problem Relation Age  of Onset  . Hypertension Mother   . Hypertension Father    History  Substance Use Topics  . Smoking status: Former Research scientist (life sciences)  . Smokeless tobacco: Never Used  . Alcohol Use: No   OB History    Gravida Para Term Preterm AB TAB SAB Ectopic Multiple Living   5 2 0 2 2 0 1 1 0 2      Review of Systems  10 systems reviewed and found to be negative, except as noted in the HPI.  Allergies  Morphine and related; Adhesive; and Latex  Home Medications   Prior to Admission medications   Medication Sig Start Date End Date Taking? Authorizing Provider  acetaminophen (TYLENOL) 500 MG tablet Take 500 mg by mouth every 6 (six) hours as needed. For pain    Historical Provider, MD  ibuprofen (ADVIL) 600 MG tablet Take 1 tablet (600 mg total) by mouth every 6 (six) hours as needed for pain. 05/09/13   Megan Morris, DO  Menthol, Topical Analgesic, (ICY HOT BACK) 5 % PADS Apply 1 patch topically as needed (For Shoulder Pain.).    Historical Provider, MD  oxyCODONE-acetaminophen (PERCOCET) 7.5-325 MG per tablet Take 1 tablet by mouth every 4 (four) hours as needed for pain. 05/09/13   Megan Morris, DO  penicillin v potassium (VEETID) 500 MG tablet Take 1 tablet (500 mg total) by mouth 4 (four) times daily. 04/22/13   Noland Fordyce, PA-C  Prenatal Vit-Fe Fumarate-FA (MULTIVITAMIN-PRENATAL) 27-0.8 MG TABS tablet Take 1 tablet by  mouth daily at 12 noon.    Historical Provider, MD  traMADol (ULTRAM) 50 MG tablet Take 1 tablet (50 mg total) by mouth every 6 (six) hours as needed for pain (take 1-2 pills every 6-8 hours as needed for pain). 04/22/13   Noland Fordyce, PA-C   BP 121/86 mmHg  Pulse 110  Temp(Src) 97.9 F (36.6 C)  Resp 18  SpO2 96% Physical Exam  Constitutional: She is oriented to person, place, and time. She appears well-developed and well-nourished. No distress.  HENT:  Head: Normocephalic.  Mouth/Throat: Oropharynx is clear and moist.  Eyes: Conjunctivae and EOM are normal. Pupils are equal,  round, and reactive to light.  Neck: Normal range of motion.  Cardiovascular: Normal rate, regular rhythm and intact distal pulses.   Pulmonary/Chest: Effort normal and breath sounds normal. No stridor. No respiratory distress. She has no wheezes. She has no rales. She exhibits no tenderness.  Abdominal: Soft. Bowel sounds are normal. She exhibits no distension and no mass. There is no tenderness. There is no rebound and no guarding.  Musculoskeletal: Normal range of motion. She exhibits no edema or tenderness.  No calf asymmetry, superficial collaterals, palpable cords, edema, Homans sign negative bilaterally.    Neurological: She is alert and oriented to person, place, and time.  Psychiatric: She has a normal mood and affect.  Nursing note and vitals reviewed.   ED Course  Procedures (including critical care time) Labs Review Labs Reviewed - No data to display  Imaging Review No results found.   EKG Interpretation None      MDM   Final diagnoses:  Leg pain, bilateral    Filed Vitals:   12/22/14 0821 12/22/14 0904 12/22/14 1022  BP: 121/86 110/77   Pulse: 110 100   Temp: 97.9 F (36.6 C)    Resp: 18 16   Height:   5' 5.5" (1.664 m)  Weight:   145 lb (65.772 kg)  SpO2: 96% 100%     Shannon Kemp is a pleasant 39 y.o. female presenting with bilateral leg discomfort, worse at night onset 3 days ago after she sat cross ligated for proximally 30 minutes. Physical exam with no acute abnormalities. She is neurovascularly intact, no swelling or other signs of DVT. Plan is i-STAT chem 8 to evaluate chemistries and PCP referral. Patient declines pain medication. No claudication, movement alleviates the pain. Patient has chest pain shortness of breath at her baseline, it is possible that these are worsening, she did have a episode of tachycardia at onset weight of 110. For these reasons d-dimer is ordered which is negative. I've advised patient to follow with her primary care  physician for evaluation of her pain.  Discussed case with attending MD who agrees with plan and stability to d/c to home.   Evaluation does not show pathology that would require ongoing emergent intervention or inpatient treatment. Pt is hemodynamically stable and mentating appropriately. Discussed findings and plan with patient/guardian, who agrees with care plan. All questions answered. Return precautions discussed and outpatient follow up given.     Monico Blitz, PA-C 12/22/14 [place], MD 12/25/14 [phone]

## 2014-12-22 NOTE — ED Notes (Signed)
EDPA NICHOLE at bedside.

## 2015-02-27 ENCOUNTER — Emergency Department (HOSPITAL_COMMUNITY)
Admission: EM | Admit: 2015-02-27 | Discharge: 2015-02-27 | Disposition: A | Discharge status: Home / Self Care | Payer: Managed Care, Other (non HMO) | Attending: Emergency Medicine | Admitting: Emergency Medicine

## 2015-02-27 ENCOUNTER — Encounter (HOSPITAL_COMMUNITY): Payer: Self-pay | Admitting: *Deleted

## 2015-02-27 ENCOUNTER — Emergency Department (HOSPITAL_COMMUNITY): Payer: Managed Care, Other (non HMO)

## 2015-02-27 DIAGNOSIS — Y929 Unspecified place or not applicable: Secondary | ICD-10-CM | POA: Diagnosis not present

## 2015-02-27 DIAGNOSIS — Z87448 Personal history of other diseases of urinary system: Secondary | ICD-10-CM | POA: Diagnosis not present

## 2015-02-27 DIAGNOSIS — R51 Headache: Secondary | ICD-10-CM | POA: Diagnosis not present

## 2015-02-27 DIAGNOSIS — Z3202 Encounter for pregnancy test, result negative: Secondary | ICD-10-CM | POA: Insufficient documentation

## 2015-02-27 DIAGNOSIS — Z91040 Latex allergy status: Secondary | ICD-10-CM | POA: Diagnosis not present

## 2015-02-27 DIAGNOSIS — R0789 Other chest pain: Secondary | ICD-10-CM | POA: Diagnosis not present

## 2015-02-27 DIAGNOSIS — Z87891 Personal history of nicotine dependence: Secondary | ICD-10-CM | POA: Insufficient documentation

## 2015-02-27 DIAGNOSIS — N390 Urinary tract infection, site not specified: Secondary | ICD-10-CM | POA: Diagnosis not present

## 2015-02-27 DIAGNOSIS — S1091XA Abrasion of unspecified part of neck, initial encounter: Secondary | ICD-10-CM | POA: Insufficient documentation

## 2015-02-27 DIAGNOSIS — Y9389 Activity, other specified: Secondary | ICD-10-CM | POA: Insufficient documentation

## 2015-02-27 DIAGNOSIS — H538 Other visual disturbances: Secondary | ICD-10-CM | POA: Diagnosis present

## 2015-02-27 DIAGNOSIS — Y999 Unspecified external cause status: Secondary | ICD-10-CM | POA: Diagnosis not present

## 2015-02-27 DIAGNOSIS — X58XXXA Exposure to other specified factors, initial encounter: Secondary | ICD-10-CM | POA: Diagnosis not present

## 2015-02-27 DIAGNOSIS — M542 Cervicalgia: Secondary | ICD-10-CM

## 2015-02-27 LAB — CBC WITH DIFFERENTIAL/PLATELET
BASOS PCT: 1 % (ref 0–1)
Basophils Absolute: 0 10*3/uL (ref 0.0–0.1)
EOS ABS: 0.1 10*3/uL (ref 0.0–0.7)
Eosinophils Relative: 2 % (ref 0–5)
HCT: 41.2 % (ref 36.0–46.0)
Hemoglobin: 13.8 g/dL (ref 12.0–15.0)
LYMPHS ABS: 1.6 10*3/uL (ref 0.7–4.0)
Lymphocytes Relative: 25 % (ref 12–46)
MCH: 28.3 pg (ref 26.0–34.0)
MCHC: 33.5 g/dL (ref 30.0–36.0)
MCV: 84.6 fL (ref 78.0–100.0)
MONOS PCT: 6 % (ref 3–12)
Monocytes Absolute: 0.4 10*3/uL (ref 0.1–1.0)
NEUTROS ABS: 4 10*3/uL (ref 1.7–7.7)
Neutrophils Relative %: 66 % (ref 43–77)
PLATELETS: 228 10*3/uL (ref 150–400)
RBC: 4.87 MIL/uL (ref 3.87–5.11)
RDW: 13.7 % (ref 11.5–15.5)
WBC: 6.1 10*3/uL (ref 4.0–10.5)

## 2015-02-27 LAB — URINALYSIS, ROUTINE W REFLEX MICROSCOPIC
Bilirubin Urine: NEGATIVE
GLUCOSE, UA: NEGATIVE mg/dL
Hgb urine dipstick: NEGATIVE
Ketones, ur: NEGATIVE mg/dL
NITRITE: NEGATIVE
Protein, ur: NEGATIVE mg/dL
SPECIFIC GRAVITY, URINE: 1.009 (ref 1.005–1.030)
Urobilinogen, UA: 0.2 mg/dL (ref 0.0–1.0)
pH: 7 (ref 5.0–8.0)

## 2015-02-27 LAB — COMPREHENSIVE METABOLIC PANEL
ALT: 13 U/L — ABNORMAL LOW (ref 14–54)
AST: 13 U/L — ABNORMAL LOW (ref 15–41)
Albumin: 3.9 g/dL (ref 3.5–5.0)
Alkaline Phosphatase: 57 U/L (ref 38–126)
Anion gap: 7 (ref 5–15)
BUN: 6 mg/dL (ref 6–20)
CALCIUM: 8.8 mg/dL — AB (ref 8.9–10.3)
CO2: 24 mmol/L (ref 22–32)
CREATININE: 0.61 mg/dL (ref 0.44–1.00)
Chloride: 108 mmol/L (ref 101–111)
GLUCOSE: 94 mg/dL (ref 65–99)
POTASSIUM: 3.9 mmol/L (ref 3.5–5.1)
SODIUM: 139 mmol/L (ref 135–145)
Total Bilirubin: 0.4 mg/dL (ref 0.3–1.2)
Total Protein: 6.8 g/dL (ref 6.5–8.1)

## 2015-02-27 LAB — URINE MICROSCOPIC-ADD ON

## 2015-02-27 LAB — TROPONIN I: Troponin I: <0.03 ng/mL (ref <0.031)

## 2015-02-27 LAB — PREGNANCY, URINE: Preg Test, Ur: NEGATIVE

## 2015-02-27 MED ORDER — METHOCARBAMOL 500 MG PO TABS: 500.0000 mg | ORAL_TABLET | Freq: Two times a day (BID) | ORAL | Status: DC

## 2015-02-27 MED ORDER — CEPHALEXIN 500 MG PO CAPS: 500.0000 mg | ORAL_CAPSULE | Freq: Four times a day (QID) | ORAL | Status: DC

## 2015-02-27 MED ORDER — SODIUM CHLORIDE 0.9 % IV BOLUS (SEPSIS)
1000.0000 mL | Freq: Once | INTRAVENOUS | Status: AC
  Administered 2015-02-27: 1000 mL via INTRAVENOUS

## 2015-02-27 NOTE — ED Provider Notes (Signed)
CSN: [id number]     Arrival date & time 02/27/15  7829 History   First MD Initiated Contact with Patient 02/27/15 1052     Chief Complaint  Patient presents with  . Blurred Vision     (Consider location/radiation/quality/duration/timing/severity/associated sxs/prior Treatment) Patient is a 39 y.o. female presenting with headaches. The history is provided by the patient. No language interpreter was used.  Headache Pain location:  Generalized Quality:  Sharp Radiates to:  Eyes Severity currently:  3/10 Onset quality:  Gradual Timing:  Constant Progression:  Worsening Chronicity:  New Similar to prior headaches: no   Context: not activity   Relieved by:  Nothing Worsened by:  Nothing Ineffective treatments:  None tried Associated symptoms: neck pain and paresthesias   Associated symptoms: no near-syncope   Risk factors: no family hx of SAH   Pt complains of a heache for several days.   Pt has  Had blurred vision for the past 1 month.  Pt reports vision is worse today.  Pt reports she had pain in her chest today.  Pt reports she has been dizzy for 3 days.  Pt felt like she was going to pass out today.  Past Medical History  Diagnosis Date  . Weight increase   . Fatigue   . Sleep difficulties   . Breast pain   . Nipple discharge   . Bruises easily   . Generalized headaches   . Allergy    Past Surgical History  Procedure Laterality Date  . Orif ulnar fracture    . Pleural scarification    . Laparoscopy  04/01/2012    Procedure: LAPAROSCOPY OPERATIVE;  Surgeon: Claiborne Billings A. Pamala Hurry, MD;  Location: [facility];  Service: Gynecology;  Laterality: N/A;  Operative laparoscopy/right salpingectomy and removal of ectopic pregnancy  . Laparoscopy Left 05/09/2013    Procedure: LAPAROSCOPY OPERATIVE/ECTOPIC;  Surgeon: Linda Hedges, DO;  Location: [facility];  Service: Gynecology;  Laterality: Left;  WITH LEFT SALPINGECTOMY   Family History  Problem Relation Age of Onset  . Hypertension Mother    . Hypertension Father    History  Substance Use Topics  . Smoking status: Former Research scientist (life sciences)  . Smokeless tobacco: Never Used  . Alcohol Use: No   OB History    Gravida Para Term Preterm AB TAB SAB Ectopic Multiple Living   5 2 0 2 2 0 1 1 0 2      Review of Systems  Eyes: Positive for visual disturbance.  Cardiovascular: Negative for near-syncope.  Musculoskeletal: Positive for neck pain.  Neurological: Positive for headaches and paresthesias.      Allergies  Morphine and related; Adhesive; and Latex  Home Medications   Prior to Admission medications   Medication Sig Start Date End Date Taking? Authorizing Provider  acetaminophen (TYLENOL) 500 MG tablet Take 1,000 mg by mouth every 8 (eight) hours as needed (pain).   Yes Historical Provider, MD  ibuprofen (ADVIL,MOTRIN) 200 MG tablet Take 800 mg by mouth every 6 (six) hours as needed for fever, headache, mild pain, moderate pain or cramping.   Yes Historical Provider, MD  Multiple Vitamin (MULTIVITAMIN WITH MINERALS) TABS tablet Take 1 tablet by mouth daily.   Yes Historical Provider, MD  ibuprofen (ADVIL) 600 MG tablet Take 1 tablet (600 mg total) by mouth every 6 (six) hours as needed for pain. Patient not taking: Reported on 12/22/2014 05/09/13   Linda Hedges, DO  traMADol (ULTRAM) 50 MG tablet Take 1 tablet (50 mg total) by mouth every  6 (six) hours as needed for pain (take 1-2 pills every 6-8 hours as needed for pain). Patient not taking: Reported on 12/22/2014 04/22/13   Noland Fordyce, PA-C   BP 104/74 mmHg  Pulse 96  Temp(Src) 97.4 F (36.3 C)  Resp 11  Ht 5\' 5"  (1.651 m)  Wt 150 lb (68.04 kg)  BMI 24.96 kg/m2  SpO2 98%  LMP 02/13/2015 Physical Exam  Constitutional: She is oriented to person, place, and time. She appears well-developed and well-nourished.  HENT:  Head: Normocephalic and atraumatic.  Right Ear: External ear normal.  Left Ear: External ear normal.  Mouth/Throat: Oropharynx is clear and moist.  Eyes:  Conjunctivae and EOM are normal. Pupils are equal, round, and reactive to light.  Neck: Normal range of motion.  Cardiovascular: Normal rate and regular rhythm.   Pulmonary/Chest: Effort normal and breath sounds normal.  Abdominal: Soft. She exhibits no distension.  Musculoskeletal: Normal range of motion.  Neurological: She is alert and oriented to person, place, and time.  Skin: Skin is warm.  Psychiatric: She has a normal mood and affect.  Nursing note and vitals reviewed.   ED Course  Procedures (including critical care time) Labs Review Labs Reviewed  COMPREHENSIVE METABOLIC PANEL - Abnormal; Notable for the following:    Calcium 8.8 (*)    AST 13 (*)    ALT 13 (*)    All other components within normal limits  CBC WITH DIFFERENTIAL/PLATELET  TROPONIN I  URINALYSIS, ROUTINE W REFLEX MICROSCOPIC (NOT AT [facility])  PREGNANCY, URINE   Results for orders placed or performed during the hospital encounter of 02/27/15  Urinalysis, Routine w reflex microscopic (not at [facility])  Result Value Ref Range   Color, Urine YELLOW YELLOW   APPearance CLEAR CLEAR   Specific Gravity, Urine 1.009 1.005 - 1.030   pH 7.0 5.0 - 8.0   Glucose, UA NEGATIVE NEGATIVE mg/dL   Hgb urine dipstick NEGATIVE NEGATIVE   Bilirubin Urine NEGATIVE NEGATIVE   Ketones, ur NEGATIVE NEGATIVE mg/dL   Protein, ur NEGATIVE NEGATIVE mg/dL   Urobilinogen, UA 0.2 0.0 - 1.0 mg/dL   Nitrite NEGATIVE NEGATIVE   Leukocytes, UA MODERATE (A) NEGATIVE  Pregnancy, urine  Result Value Ref Range   Preg Test, Ur NEGATIVE NEGATIVE  CBC with Differential/Platelet  Result Value Ref Range   WBC 6.1 4.0 - 10.5 K/uL   RBC 4.87 3.87 - 5.11 MIL/uL   Hemoglobin 13.8 12.0 - 15.0 g/dL   HCT 41.2 36.0 - 46.0 %   MCV 84.6 78.0 - 100.0 fL   MCH 28.3 26.0 - 34.0 pg   MCHC 33.5 30.0 - 36.0 g/dL   RDW 13.7 11.5 - 15.5 %   Platelets 228 150 - 400 K/uL   Neutrophils Relative % 66 43 - 77 %   Neutro Abs 4.0 1.7 - 7.7 K/uL   Lymphocytes  Relative 25 12 - 46 %   Lymphs Abs 1.6 0.7 - 4.0 K/uL   Monocytes Relative 6 3 - 12 %   Monocytes Absolute 0.4 0.1 - 1.0 K/uL   Eosinophils Relative 2 0 - 5 %   Eosinophils Absolute 0.1 0.0 - 0.7 K/uL   Basophils Relative 1 0 - 1 %   Basophils Absolute 0.0 0.0 - 0.1 K/uL  Comprehensive metabolic panel  Result Value Ref Range   Sodium 139 135 - 145 mmol/L   Potassium 3.9 3.5 - 5.1 mmol/L   Chloride 108 101 - 111 mmol/L   CO2 24 22 -  32 mmol/L   Glucose, Bld 94 65 - 99 mg/dL   BUN 6 6 - 20 mg/dL   Creatinine, Ser 0.61 0.44 - 1.00 mg/dL   Calcium 8.8 (L) 8.9 - 10.3 mg/dL   Total Protein 6.8 6.5 - 8.1 g/dL   Albumin 3.9 3.5 - 5.0 g/dL   AST 13 (L) 15 - 41 U/L   ALT 13 (L) 14 - 54 U/L   Alkaline Phosphatase 57 38 - 126 U/L   Total Bilirubin 0.4 0.3 - 1.2 mg/dL   GFR calc non Af Amer >60 >60 mL/min   GFR calc Af Amer >60 >60 mL/min   Anion gap 7 5 - 15  Troponin I  Result Value Ref Range   Troponin I <0.03 <0.031 ng/mL  Urine microscopic-add on  Result Value Ref Range   Squamous Epithelial / LPF FEW (A) RARE   WBC, UA 3-6 <3 WBC/hpf   RBC / HPF 0-2 <3 RBC/hpf   Bacteria, UA FEW (A) RARE   Ct Head Wo Contrast  02/27/2015   CLINICAL DATA:  Headaches, dizziness.  Left neck and arm pain.  EXAM: CT HEAD WITHOUT CONTRAST  CT CERVICAL SPINE WITHOUT CONTRAST  TECHNIQUE: Multidetector CT imaging of the head and cervical spine was performed following the standard protocol without intravenous contrast. Multiplanar CT image reconstructions of the cervical spine were also generated.  COMPARISON:  None.  FINDINGS: CT HEAD FINDINGS  Bony calvarium appears intact. No mass effect or midline shift is noted. Ventricular size is within normal limits. There is no evidence of mass lesion, hemorrhage or acute infarction.  CT CERVICAL SPINE FINDINGS  No fracture or spondylolisthesis is noted. Disc spaces and posterior facet joints appear intact. No significant abnormality seen in visualized lung apices.   IMPRESSION: Normal head CT.  No abnormality seen in the cervical spine.   Electronically Signed   By: Marijo Conception, M.D.   On: 02/27/2015 15:09   Ct Cervical Spine Wo Contrast  02/27/2015   CLINICAL DATA:  Headaches, dizziness.  Left neck and arm pain.  EXAM: CT HEAD WITHOUT CONTRAST  CT CERVICAL SPINE WITHOUT CONTRAST  TECHNIQUE: Multidetector CT imaging of the head and cervical spine was performed following the standard protocol without intravenous contrast. Multiplanar CT image reconstructions of the cervical spine were also generated.  COMPARISON:  None.  FINDINGS: CT HEAD FINDINGS  Bony calvarium appears intact. No mass effect or midline shift is noted. Ventricular size is within normal limits. There is no evidence of mass lesion, hemorrhage or acute infarction.  CT CERVICAL SPINE FINDINGS  No fracture or spondylolisthesis is noted. Disc spaces and posterior facet joints appear intact. No significant abnormality seen in visualized lung apices.  IMPRESSION: Normal head CT.  No abnormality seen in the cervical spine.   Electronically Signed   By: Marijo Conception, M.D.   On: 02/27/2015 15:09    Imaging Review No results found.   EKG Interpretation None    normal sinus 90, normal ekg,  Normal pr, normal qrs, normal axis  MDM   Final diagnoses:  Neck abrasion  Neck pain  Other chest pain  UTI (lower urinary tract infection)    Keflex Robaxin Recheck with primary care    Fransico Meadow, PA-C 02/27/15 [place], MD 03/03/15 1132

## 2015-02-27 NOTE — Discharge Instructions (Signed)
Urinary Tract Infection Urinary tract infections (UTIs) can develop anywhere along your urinary tract. Your urinary tract is your body's drainage system for removing wastes and extra water. Your urinary tract includes two kidneys, two ureters, a bladder, and a urethra. Your kidneys are a pair of bean-shaped organs. Each kidney is about the size of your fist. They are located below your ribs, one on each side of your spine. CAUSES Infections are caused by microbes, which are microscopic organisms, including fungi, viruses, and bacteria. These organisms are so small that they can only be seen through a microscope. Bacteria are the microbes that most commonly cause UTIs. SYMPTOMS  Symptoms of UTIs may vary by age and gender of the patient and by the location of the infection. Symptoms in young women typically include a frequent and intense urge to urinate and a painful, burning feeling in the bladder or urethra during urination. Older women and men are more likely to be tired, shaky, and weak and have muscle aches and abdominal pain. A fever may mean the infection is in your kidneys. Other symptoms of a kidney infection include pain in your back or sides below the ribs, nausea, and vomiting. DIAGNOSIS To diagnose a UTI, your caregiver will ask you about your symptoms. Your caregiver also will ask to provide a urine sample. The urine sample will be tested for bacteria and white blood cells. White blood cells are made by your body to help fight infection. TREATMENT  Typically, UTIs can be treated with medication. Because most UTIs are caused by a bacterial infection, they usually can be treated with the use of antibiotics. The choice of antibiotic and length of treatment depend on your symptoms and the type of bacteria causing your infection. HOME CARE INSTRUCTIONS  If you were prescribed antibiotics, take them exactly as your caregiver instructs you. Finish the medication even if you feel better after you  have only taken some of the medication.  Drink enough water and fluids to keep your urine clear or pale yellow.  Avoid caffeine, tea, and carbonated beverages. They tend to irritate your bladder.  Empty your bladder often. Avoid holding urine for long periods of time.  Empty your bladder before and after sexual intercourse.  After a bowel movement, women should cleanse from front to back. Use each tissue only once. SEEK MEDICAL CARE IF:   You have back pain.  You develop a fever.  Your symptoms do not begin to resolve within 3 days. SEEK IMMEDIATE MEDICAL CARE IF:   You have severe back pain or lower abdominal pain.  You develop chills.  You have nausea or vomiting.  You have continued burning or discomfort with urination. MAKE SURE YOU:   Understand these instructions.  Will watch your condition.  Will get help right away if you are not doing well or get worse. Document Released: 06/29/2005 Document Revised: 03/20/2012 Document Reviewed: 10/28/2011 [facility] Patient Information 2015 [place], [place]. This information is not intended to replace advice given to you by your health care provider. Make sure you discuss any questions you have with your health care provider. Cervical Sprain A cervical sprain is an injury in the neck in which the strong, fibrous tissues (ligaments) that connect your neck bones stretch or tear. Cervical sprains can range from mild to severe. Severe cervical sprains can cause the neck vertebrae to be unstable. This can lead to damage of the spinal cord and can result in serious nervous system problems. The amount of time it  takes for a cervical sprain to get better depends on the cause and extent of the injury. Most cervical sprains heal in 1 to 3 weeks. CAUSES  Severe cervical sprains may be caused by:   Contact sport injuries (such as from football, rugby, wrestling, hockey, auto racing, gymnastics, diving, martial arts, or boxing).   Motor vehicle  collisions.   Whiplash injuries. This is an injury from a sudden forward and backward whipping movement of the head and neck.  Falls.  Mild cervical sprains may be caused by:   Being in an awkward position, such as while cradling a telephone between your ear and shoulder.   Sitting in a chair that does not offer proper support.   Working at a poorly Landscape architect station.   Looking up or down for long periods of time.  SYMPTOMS   Pain, soreness, stiffness, or a burning sensation in the front, back, or sides of the neck. This discomfort may develop immediately after the injury or slowly, 24 hours or more after the injury.   Pain or tenderness directly in the middle of the back of the neck.   Shoulder or upper back pain.   Limited ability to move the neck.   Headache.   Dizziness.   Weakness, numbness, or tingling in the hands or arms.   Muscle spasms.   Difficulty swallowing or chewing.   Tenderness and swelling of the neck.  DIAGNOSIS  Most of the time your health care provider can diagnose a cervical sprain by taking your history and doing a physical exam. Your health care provider will ask about previous neck injuries and any known neck problems, such as arthritis in the neck. X-rays may be taken to find out if there are any other problems, such as with the bones of the neck. Other tests, such as a CT scan or MRI, may also be needed.  TREATMENT  Treatment depends on the severity of the cervical sprain. Mild sprains can be treated with rest, keeping the neck in place (immobilization), and pain medicines. Severe cervical sprains are immediately immobilized. Further treatment is done to help with pain, muscle spasms, and other symptoms and may include:  Medicines, such as pain relievers, numbing medicines, or muscle relaxants.   Physical therapy. This may involve stretching exercises, strengthening exercises, and posture training. Exercises and improved  posture can help stabilize the neck, strengthen muscles, and help stop symptoms from returning.  HOME CARE INSTRUCTIONS   Put ice on the injured area.   Put ice in a plastic bag.   Place a towel between your skin and the bag.   Leave the ice on for 15-20 minutes, 3-4 times a day.   If your injury was severe, you may have been given a cervical collar to wear. A cervical collar is a two-piece collar designed to keep your neck from moving while it heals.  Do not remove the collar unless instructed by your health care provider.  If you have long hair, keep it outside of the collar.  Ask your health care provider before making any adjustments to your collar. Minor adjustments may be required over time to improve comfort and reduce pressure on your chin or on the back of your head.  Ifyou are allowed to remove the collar for cleaning or bathing, follow your health care provider's instructions on how to do so safely.  Keep your collar clean by wiping it with mild soap and water and drying it completely. If the collar  you have been given includes removable pads, remove them every 1-2 days and hand wash them with soap and water. Allow them to air dry. They should be completely dry before you wear them in the collar.  If you are allowed to remove the collar for cleaning and bathing, wash and dry the skin of your neck. Check your skin for irritation or sores. If you see any, tell your health care provider.  Do not drive while wearing the collar.   Only take over-the-counter or prescription medicines for pain, discomfort, or fever as directed by your health care provider.   Keep all follow-up appointments as directed by your health care provider.   Keep all physical therapy appointments as directed by your health care provider.   Make any needed adjustments to your workstation to promote good posture.   Avoid positions and activities that make your symptoms worse.   Warm up and  stretch before being active to help prevent problems.  SEEK MEDICAL CARE IF:   Your pain is not controlled with medicine.   You are unable to decrease your pain medicine over time as planned.   Your activity level is not improving as expected.  SEEK IMMEDIATE MEDICAL CARE IF:   You develop any bleeding.  You develop stomach upset.  You have signs of an allergic reaction to your medicine.   Your symptoms get worse.   You develop new, unexplained symptoms.   You have numbness, tingling, weakness, or paralysis in any part of your body.  MAKE SURE YOU:   Understand these instructions.  Will watch your condition.  Will get help right away if you are not doing well or get worse. Document Released: 07/17/2007 Document Revised: 09/24/2013 Document Reviewed: 03/27/2013 [facility] Patient Information 2015 [place], [place]. This information is not intended to replace advice given to you by your health care provider. Make sure you discuss any questions you have with your health care provider.

## 2015-02-27 NOTE — ED Notes (Signed)
Pt reports intermittent blurred visions for 1 month. Pt states that she has headaches as well. Pt reports some dizziness for 3 days.

## 2015-02-27 NOTE — ED Notes (Signed)
Pt complaining of left neck pain as well as left arm pain. Also reports chest pain that began today on the left side that is described as tightness, intermittently with some sob. Believes this is stress. Pt HR 108 & regular.

## 2016-08-14 ENCOUNTER — Emergency Department (HOSPITAL_COMMUNITY): Payer: Managed Care, Other (non HMO)

## 2016-08-14 ENCOUNTER — Encounter (HOSPITAL_COMMUNITY): Payer: Self-pay

## 2016-08-14 ENCOUNTER — Emergency Department (HOSPITAL_COMMUNITY)
Admission: EM | Admit: 2016-08-14 | Discharge: 2016-08-14 | Disposition: A | Discharge status: Home / Self Care | Payer: Managed Care, Other (non HMO) | Attending: Emergency Medicine | Admitting: Emergency Medicine

## 2016-08-14 DIAGNOSIS — W0110XA Fall on same level from slipping, tripping and stumbling with subsequent striking against unspecified object, initial encounter: Secondary | ICD-10-CM | POA: Insufficient documentation

## 2016-08-14 DIAGNOSIS — Z91040 Latex allergy status: Secondary | ICD-10-CM | POA: Insufficient documentation

## 2016-08-14 DIAGNOSIS — Z79899 Other long term (current) drug therapy: Secondary | ICD-10-CM | POA: Insufficient documentation

## 2016-08-14 DIAGNOSIS — Y92009 Unspecified place in unspecified non-institutional (private) residence as the place of occurrence of the external cause: Secondary | ICD-10-CM | POA: Insufficient documentation

## 2016-08-14 DIAGNOSIS — S0181XA Laceration without foreign body of other part of head, initial encounter: Secondary | ICD-10-CM | POA: Insufficient documentation

## 2016-08-14 DIAGNOSIS — S0990XA Unspecified injury of head, initial encounter: Secondary | ICD-10-CM

## 2016-08-14 DIAGNOSIS — Y999 Unspecified external cause status: Secondary | ICD-10-CM | POA: Insufficient documentation

## 2016-08-14 DIAGNOSIS — Z87891 Personal history of nicotine dependence: Secondary | ICD-10-CM | POA: Insufficient documentation

## 2016-08-14 DIAGNOSIS — Z23 Encounter for immunization: Secondary | ICD-10-CM | POA: Insufficient documentation

## 2016-08-14 DIAGNOSIS — Y939 Activity, unspecified: Secondary | ICD-10-CM | POA: Insufficient documentation

## 2016-08-14 MED ORDER — KETOROLAC TROMETHAMINE 60 MG/2ML IM SOLN
60.0000 mg | Freq: Once | INTRAMUSCULAR | Status: AC
  Administered 2016-08-14: 60 mg via INTRAMUSCULAR
  Filled 2016-08-14: qty 2

## 2016-08-14 MED ORDER — ONDANSETRON 4 MG PO TBDP: 4.0000 mg | ORAL_TABLET | Freq: Three times a day (TID) | ORAL | 0 refills | Status: DC | PRN

## 2016-08-14 MED ORDER — NAPROXEN 500 MG PO TABS: 500.0000 mg | ORAL_TABLET | Freq: Two times a day (BID) | ORAL | 0 refills | Status: DC

## 2016-08-14 MED ORDER — TETANUS-DIPHTH-ACELL PERTUSSIS 5-2.5-18.5 LF-MCG/0.5 IM SUSP
0.5000 mL | Freq: Once | INTRAMUSCULAR | Status: AC
  Administered 2016-08-14: 0.5 mL via INTRAMUSCULAR
  Filled 2016-08-14: qty 0.5

## 2016-08-14 NOTE — ED Notes (Signed)
Per RN ice packs x2 provided to pt; education re: ice pack use. [employer]

## 2016-08-14 NOTE — ED Triage Notes (Signed)
Fell at home, tripped over cat and fell on concrete floor, laceration over rt eye,  no LOC, rt foot/ankle pain, rt foot edema, patient not able to put weight on rt foot, pt is able to move ankle and toes, +pedal pulses.

## 2016-08-14 NOTE — ED Provider Notes (Signed)
[facility] DEPT Provider Note   CSN: [id number] Arrival date & time: 08/14/16  [id number]     History   Chief Complaint Chief Complaint  Patient presents with  . Fall  . Foot Injury  . Head Laceration    over rt eye    HPI Shannon Kemp is a 40 y.o. female.  HPI   Shannon Kemp is a 40 y.o. female, patient with no pertinent past medical history, presenting to the ED with injuries from a fall that occurred just prior to arrival. Pt states she tripped over her cat, fell, and hit her head on the concrete floor. Complains of a facial laceration and right foot pain and swelling. Pain is 8/10, throbbing, nonradiating. Pt denies LOC, neuro deficits, neck/back pain, vomiting, dizziness, or any other complaints.   Tetanus not up to date.    Past Medical History:  Diagnosis Date  . Allergy   . Breast pain   . Bruises easily   . Fatigue   . Generalized headaches   . Nipple discharge   . Sleep difficulties   . Weight increase     There are no active problems to display for this patient.   Past Surgical History:  Procedure Laterality Date  . LAPAROSCOPY  04/01/2012   Procedure: LAPAROSCOPY OPERATIVE;  Surgeon: Claiborne Billings A. Pamala Hurry, MD;  Location: [facility];  Service: Gynecology;  Laterality: N/A;  Operative laparoscopy/right salpingectomy and removal of ectopic pregnancy  . LAPAROSCOPY Left 05/09/2013   Procedure: LAPAROSCOPY OPERATIVE/ECTOPIC;  Surgeon: Linda Hedges, DO;  Location: [facility];  Service: Gynecology;  Laterality: Left;  WITH LEFT SALPINGECTOMY  . ORIF ULNAR FRACTURE    . PLEURAL SCARIFICATION      OB History    Gravida Para Term Preterm AB Living   5 2 0 2 2 2    SAB TAB Ectopic Multiple Live Births   1 0 1 0         Home Medications    Prior to Admission medications   Medication Sig Start Date End Date Taking? Authorizing Provider  acetaminophen (TYLENOL) 500 MG tablet Take 1,000 mg by mouth every 8 (eight) hours as needed (pain).   Yes Historical  Provider, MD  hydroxypropyl methylcellulose / hypromellose (ISOPTO TEARS / GONIOVISC) 2.5 % ophthalmic solution Place 1 drop into both eyes 3 (three) times daily as needed for dry eyes.   Yes Historical Provider, MD  ibuprofen (ADVIL,MOTRIN) 200 MG tablet Take 800 mg by mouth every 6 (six) hours as needed for fever, headache, mild pain, moderate pain or cramping.   Yes Historical Provider, MD  Multiple Vitamin (MULTIVITAMIN WITH MINERALS) TABS tablet Take 1 tablet by mouth daily.   Yes Historical Provider, MD  ibuprofen (ADVIL) 600 MG tablet Take 1 tablet (600 mg total) by mouth every 6 (six) hours as needed for pain. Patient not taking: Reported on 08/14/2016 05/09/13   Linda Hedges, DO  naproxen (NAPROSYN) 500 MG tablet Take 1 tablet (500 mg total) by mouth 2 (two) times daily. 08/14/16   Chaquana Nichols C Aiman Noe, PA-C  ondansetron (ZOFRAN ODT) 4 MG disintegrating tablet Take 1 tablet (4 mg total) by mouth every 8 (eight) hours as needed for nausea or vomiting. 08/14/16   Lorayne Bender, PA-C    Family History Family History  Problem Relation Age of Onset  . Hypertension Mother   . Hypertension Father     Social History Social History  Substance Use Topics  . Smoking status: Former Research scientist (life sciences)  .  Smokeless tobacco: Never Used  . Alcohol use No     Allergies   Morphine and related; Adhesive [tape]; and Latex   Review of Systems Review of Systems  Respiratory: Negative for shortness of breath.   Cardiovascular: Negative for chest pain.  Gastrointestinal: Negative for abdominal pain, nausea and vomiting.  Musculoskeletal: Positive for arthralgias. Negative for back pain and neck pain.  Skin: Positive for wound.  Neurological: Negative for weakness and numbness.  All other systems reviewed and are negative.    Physical Exam Updated Vital Signs BP 127/98 (BP Location: Left Arm)   Pulse 106   Temp 98 F (36.7 C) (Oral)   Resp 20   Ht 5\' 5"  (1.651 m)   Wt 66.7 kg   SpO2 99%   BMI 24.46 kg/m     Physical Exam  Constitutional: She appears well-developed and well-nourished. No distress.  HENT:  Head: Normocephalic.    Mouth/Throat: Oropharynx is clear and moist.  1.5 cm largely superficial laceration lateral to the right eye. Facial and skull bones without crepitus or instability noted.  Tenderness to the right mandible without swelling or crepitus. Opens mouth to at least three finger widths. Handles oral secretions without difficulty. No trismus.  Eyes: Conjunctivae and EOM are normal. Pupils are equal, round, and reactive to light.  Neck: Neck supple.  Cardiovascular: Normal rate, regular rhythm, normal heart sounds and intact distal pulses.   Pulmonary/Chest: Effort normal and breath sounds normal. No respiratory distress.  Abdominal: Soft. There is no tenderness. There is no guarding.  Musculoskeletal: She exhibits tenderness. She exhibits no edema.  Normal motor function intact in all extremities and spine. No midline spinal tenderness.   Lymphadenopathy:    She has no cervical adenopathy.  Neurological: She is alert.  No sensory deficits. Strength 5/5 in all extremities. No gait disturbance. Coordination intact. Cranial nerves III-XII grossly intact.   Skin: Skin is warm and dry. She is not diaphoretic.  Psychiatric: She has a normal mood and affect. Her behavior is normal.  Nursing note and vitals reviewed.    ED Treatments / Results  Labs (all labs ordered are listed, but only abnormal results are displayed) Labs Reviewed - No data to display  EKG  EKG Interpretation None       Radiology Dg Foot Complete Right  Result Date: 08/14/2016 CLINICAL DATA:  Lateral foot pain. EXAM: RIGHT FOOT COMPLETE - 3+ VIEW COMPARISON:  8/14/9 FINDINGS: There is no evidence of fracture or dislocation. There is no evidence of arthropathy or other focal bone abnormality. Soft tissues are unremarkable. IMPRESSION: Negative. Electronically Signed   By: Kerby Moors M.D.   On:  08/14/2016 10:35    Procedures .Marland KitchenLaceration Repair Date/Time: 08/14/2016 9:55 AM Performed by: Lorayne Bender Authorized by: Lorayne Bender   Consent:    Consent obtained:  Verbal   Consent given by:  Patient   Risks discussed:  Infection, need for additional repair, poor wound healing, poor cosmetic result and pain   Alternatives discussed:  No treatment Anesthesia (see MAR for exact dosages):    Anesthesia method:  None Laceration details:    Location:  Face   Face location:  R eyebrow   Length (cm):  1.5 Repair type:    Repair type:  Simple Exploration:    Hemostasis achieved with:  Direct pressure   Wound exploration: wound explored through full range of motion   Treatment:    Amount of cleaning:  Standard   Irrigation  solution:  Sterile saline   Irrigation method:  Syringe Skin repair:    Repair method:  Tissue adhesive Approximation:    Approximation:  Close Post-procedure details:    Dressing:  Open (no dressing)   Patient tolerance of procedure:  Tolerated well, no immediate complications    (including critical care time)  Medications Ordered in ED Medications  ketorolac (TORADOL) injection 60 mg (60 mg Intramuscular Given 08/14/16 1004)  Tdap (BOOSTRIX) injection 0.5 mL (0.5 mLs Intramuscular Given 08/14/16 1004)     Initial Impression / Assessment and Plan / ED Course  I have reviewed the triage vital signs and the nursing notes.  Pertinent labs & imaging results that were available during my care of the patient were reviewed by me and considered in my medical decision making (see chart for details).  Clinical Course     Patient presents following a trip and fall causing facial lac and right foot pain. Patient may have a minor concussion. No signs of serious head injury. No functional or neuro deficits. No abnormalities on foot x-ray. The patient was given instructions for home care as well as return precautions. Patient voices understanding of these  instructions, accepts the plan, and is comfortable with discharge.   Vitals:   08/14/16 0936 08/14/16 0938 08/14/16 1114  BP: 127/98  115/98  Pulse: 106  94  Resp: 20  18  Temp: 98 F (36.7 C)  98 F (36.7 C)  TempSrc: Oral  Oral  SpO2: 99%  99%  Weight:  66.7 kg   Height:  5\' 5"  (1.651 m)     Final Clinical Impressions(s) / ED Diagnoses   Final diagnoses:  Injury of head, initial encounter  Facial laceration, initial encounter    New Prescriptions Discharge Medication List as of 08/14/2016 11:26 AM    START taking these medications   Details  naproxen (NAPROSYN) 500 MG tablet Take 1 tablet (500 mg total) by mouth 2 (two) times daily., Starting Sun 08/14/2016, Print    ondansetron (ZOFRAN ODT) 4 MG disintegrating tablet Take 1 tablet (4 mg total) by mouth every 8 (eight) hours as needed for nausea or vomiting., Starting Sun 08/14/2016, Print         Lorayne Bender, PA-C 08/14/16 1641    Tanna Furry, MD 08/31/16 1040

## 2016-08-14 NOTE — Discharge Instructions (Signed)
Head injury: You sustained a head injury and likely a minor concussion. There are no signs of serious head injury at this time. Refer to the attached literature for information regarding expected symptoms and symptoms that would require you to return to the ED. Zofran has been prescribed for nausea. Wound Care: You must wait at least 8 hours after the wound repair to wash the wound. Rinse well and blot dry. Do not scrub the wound, as this may cause the wound edges to come apart and the glue to peel prematurely. You may shower, but avoid submerging the wound, such as with a bath or swimming. You may use Tylenol, naproxen, ibuprofen for pain.  Return to the ED sooner should the wound edges come apart or signs of infection arise, such as spreading redness, puffiness/swelling, pus draining from the wound, severe increase in pain, or any other major issues.  Foot injury: There were no fractures or dislocations noted on the xray. Elevate the extremity whenever possible. Weightbearing as tolerated. Naproxen or ibuprofen to reduce pain and inflammation.

## 2017-06-26 ENCOUNTER — Emergency Department (HOSPITAL_COMMUNITY): Payer: Managed Care, Other (non HMO)

## 2017-06-26 ENCOUNTER — Emergency Department (HOSPITAL_COMMUNITY)
Admission: EM | Admit: 2017-06-26 | Discharge: 2017-06-26 | Disposition: A | Discharge status: Home / Self Care | Payer: Managed Care, Other (non HMO) | Attending: Emergency Medicine | Admitting: Emergency Medicine

## 2017-06-26 ENCOUNTER — Encounter (HOSPITAL_COMMUNITY): Payer: Self-pay | Admitting: Emergency Medicine

## 2017-06-26 DIAGNOSIS — R059 Cough, unspecified: Secondary | ICD-10-CM

## 2017-06-26 DIAGNOSIS — Z87891 Personal history of nicotine dependence: Secondary | ICD-10-CM | POA: Insufficient documentation

## 2017-06-26 DIAGNOSIS — R109 Unspecified abdominal pain: Secondary | ICD-10-CM | POA: Diagnosis not present

## 2017-06-26 DIAGNOSIS — J069 Acute upper respiratory infection, unspecified: Secondary | ICD-10-CM | POA: Diagnosis not present

## 2017-06-26 DIAGNOSIS — R05 Cough: Secondary | ICD-10-CM | POA: Diagnosis not present

## 2017-06-26 DIAGNOSIS — R197 Diarrhea, unspecified: Secondary | ICD-10-CM | POA: Diagnosis not present

## 2017-06-26 DIAGNOSIS — Z79899 Other long term (current) drug therapy: Secondary | ICD-10-CM | POA: Insufficient documentation

## 2017-06-26 LAB — D-DIMER, QUANTITATIVE (NOT AT ARMC): D DIMER QUANT: 0.36 ug{FEU}/mL (ref 0.00–0.50)

## 2017-06-26 NOTE — ED Provider Notes (Signed)
[facility] DEPT Provider Note   CSN: [id number] Arrival date & time: 06/26/17  0803     History   Chief Complaint Chief Complaint  Patient presents with  . Cough    HPI   Shannon Kemp is a 41 y.o. female with history of allergic rhinitis and pneumothorax, presents today after she had 2 coughing fits and coughed up some sputum that had streaks of blood in it. Patient reports cough congestion and sore throat for the past 3 days, but has not coughed up any blood streak sputum until today. Patient denies shortness of breath, chest pain, fevers, chills. Denies exogenous estrogen use, lower extremity pain or swelling, recent travel or immobilization, history of PE or DVT, family or personal history of bleeding or clotting disorders. Patient also complains of generalized abdominal pain has been on and off for weeks, no nausea or vomiting, occasional diarrhea. These symptoms have been going on for a while, and patient feels they may be related to certain foods.        Past Medical History:  Diagnosis Date  . Allergy   . Breast pain   . Bruises easily   . Fatigue   . Generalized headaches   . Nipple discharge   . Sleep difficulties   . Weight increase     There are no active problems to display for this patient.   Past Surgical History:  Procedure Laterality Date  . LAPAROSCOPY  04/01/2012   Procedure: LAPAROSCOPY OPERATIVE;  Surgeon: Claiborne Billings A. Pamala Hurry, MD;  Location: [facility];  Service: Gynecology;  Laterality: N/A;  Operative laparoscopy/right salpingectomy and removal of ectopic pregnancy  . LAPAROSCOPY Left 05/09/2013   Procedure: LAPAROSCOPY OPERATIVE/ECTOPIC;  Surgeon: Linda Hedges, DO;  Location: [facility];  Service: Gynecology;  Laterality: Left;  WITH LEFT SALPINGECTOMY  . ORIF ULNAR FRACTURE    . PLEURAL SCARIFICATION      OB History    Gravida Para Term Preterm AB Living   5 2 0 2 2 2    SAB TAB Ectopic Multiple Live Births   1 0 1 0         Home  Medications    Prior to Admission medications   Medication Sig Start Date End Date Taking? Authorizing Provider  acetaminophen (TYLENOL) 500 MG tablet Take 1,000 mg by mouth every 8 (eight) hours as needed (pain).   Yes [provider]  ibuprofen (ADVIL,MOTRIN) 200 MG tablet Take 800 mg by mouth every 6 (six) hours as needed for fever, headache, mild pain, moderate pain or cramping.   Yes [provider]  Multiple Vitamin (MULTIVITAMIN WITH MINERALS) TABS tablet Take 1 tablet by mouth daily.   Yes [provider]  naproxen (NAPROSYN) 500 MG tablet Take 1 tablet (500 mg total) by mouth 2 (two) times daily. 08/14/16  Yes Joy, Shawn C, PA-C  hydroxypropyl methylcellulose / hypromellose (ISOPTO TEARS / GONIOVISC) 2.5 % ophthalmic solution Place 1 drop into both eyes 3 (three) times daily as needed for dry eyes.    [provider]  ibuprofen (ADVIL) 600 MG tablet Take 1 tablet (600 mg total) by mouth every 6 (six) hours as needed for pain. Patient not taking: Reported on 06/26/2017 05/09/13   Linda Hedges, DO  ondansetron (ZOFRAN ODT) 4 MG disintegrating tablet Take 1 tablet (4 mg total) by mouth every 8 (eight) hours as needed for nausea or vomiting. Patient not taking: Reported on 06/26/2017 08/14/16   Layla Maw    Family History  Family History  Problem Relation Age of Onset  . Hypertension Mother   . Hypertension Father     Social History Social History  Substance Use Topics  . Smoking status: Former Research scientist (life sciences)  . Smokeless tobacco: Never Used  . Alcohol use No     Allergies   Morphine and related; Adhesive [tape]; and Latex   Review of Systems Review of Systems  Constitutional: Negative for chills and fever.  HENT: Positive for congestion, rhinorrhea and sore throat. Negative for ear pain.   Eyes: Negative for photophobia and visual disturbance.  Respiratory: Positive for cough. Negative for chest tightness, shortness of breath and  wheezing.   Cardiovascular: Negative for chest pain.  Gastrointestinal: Positive for abdominal pain. Negative for diarrhea and vomiting.  Genitourinary: Negative for difficulty urinating and dysuria.  Musculoskeletal: Negative for arthralgias and myalgias.  Skin: Negative for pallor and rash.  Neurological: Negative for dizziness, weakness, numbness and headaches.     Physical Exam Updated Vital Signs BP (!) 121/93 (BP Location: Left Arm)   Pulse 83   Temp 97.8 F (36.6 C) (Oral)   Resp 18   LMP 06/19/2017   SpO2 100%   Physical Exam  Constitutional: She appears well-developed and well-nourished. No distress.  HENT:  Head: Normocephalic and atraumatic.  TMs clear with good landmarks, moderate nasal mucosa edema with clear rhinorrhea, posterior oropharynx clear without edema, erythema or exudates  Eyes: Pupils are equal, round, and reactive to light. EOM are normal. Right eye exhibits no discharge. Left eye exhibits no discharge.  Neck: Neck supple.  Cardiovascular: Normal rate, regular rhythm, normal heart sounds and intact distal pulses.   Pulmonary/Chest: Effort normal and breath sounds normal. No respiratory distress.  Lung fields clear bilaterally with good air movement, no evidence of respiratory distress  Abdominal: Soft. Bowel sounds are normal. She exhibits no distension and no mass. There is no tenderness. There is no guarding.  Musculoskeletal: She exhibits no edema or deformity.  Lymphadenopathy:    She has no cervical adenopathy.  Neurological: She is alert. Coordination normal.  Skin: Skin is warm and dry. Capillary refill takes less than 2 seconds. She is not diaphoretic.  Psychiatric: She has a normal mood and affect. Her behavior is normal.  Nursing note and vitals reviewed.    ED Treatments / Results  Labs (all labs ordered are listed, but only abnormal results are displayed) Labs Reviewed  D-DIMER, QUANTITATIVE (NOT AT [facility])    EKG  EKG  Interpretation None       Radiology Dg Chest 2 View  Result Date: 06/26/2017 CLINICAL DATA:  Cough for 1 week with chest pain and shortness of breath. EXAM: CHEST  2 VIEW COMPARISON:  03/10/2008. FINDINGS: Trachea is midline. Heart size normal. Minimal biapical pleuroparenchymal scarring. Lungs are hyperinflated but otherwise clear. No pleural fluid. IMPRESSION: No acute findings. Electronically Signed   By: Lorin Picket M.D.   On: 06/26/2017 13:38    Procedures Procedures (including critical care time)  Medications Ordered in ED Medications - No data to display   Initial Impression / Assessment and Plan / ED Course  I have reviewed the triage vital signs and the nursing notes.  Pertinent labs & imaging results that were available during my care of the patient were reviewed by me and considered in my medical decision making (see chart for details).  Patient presents with cough and 2 episodes of hemoptysis. Vitals normal on initial eval and patient well appearing. Cough with minimal blood  streaks in setting of nasal congestion, rhinorrhea and sore throat points to an infectious etiology, CXR without evidence of pneumonia, likely viral URI. Wile I have a low suspicon for PE, patient cannot be considered PERC negative with hemoptysis. Had shared decision making discussion with patient and preferred to order do d-dimer to hopefull rule out PE for peace of mind. D-dimer negative, pt will be discharged with symptomatic treatment.  Verbalizes understanding and is agreeable with plan. Pt is hemodynamically stable & in NAD prior to dc.   Final Clinical Impressions(s) / ED Diagnoses   Final diagnoses:  Cough  Upper respiratory tract infection, unspecified type    New Prescriptions Discharge Medication List as of 06/26/2017  3:40 PM       Jacqlyn Larsen, PA-C 06/26/17 2026    Tegeler, Gwenyth Allegra, MD 06/28/17 2029

## 2017-06-26 NOTE — ED Triage Notes (Signed)
Per pt, states had a "coughing fit" this am-states then she dry heaved and vomited-states some blood tinged sputum-states abdominal pain on and off for weeks

## 2017-06-26 NOTE — Discharge Instructions (Signed)
Your workup is reassuring, x-ray shows no evidence of pneumonia and labs rule out blood clot. You can continue to treat your symptoms with over-the-counter medications. Return to the emergency department if you start to develop fever, chills, pain with deep breath, or shortness of breath. Follow-up with your primary doctor.

## 2017-07-18 ENCOUNTER — Emergency Department (HOSPITAL_COMMUNITY)
Admission: EM | Admit: 2017-07-18 | Discharge: 2017-07-18 | Disposition: A | Discharge status: Other Acute Inpatient Hospital | Payer: Managed Care, Other (non HMO) | Attending: Emergency Medicine | Admitting: Emergency Medicine

## 2017-07-18 ENCOUNTER — Encounter (HOSPITAL_COMMUNITY): Payer: Self-pay

## 2017-07-18 ENCOUNTER — Encounter (HOSPITAL_COMMUNITY): Payer: Self-pay | Admitting: *Deleted

## 2017-07-18 ENCOUNTER — Inpatient Hospital Stay (HOSPITAL_COMMUNITY)
Admission: AD | Admit: 2017-07-18 | Discharge: 2017-07-24 | DRG: 881 | Disposition: A | Discharge status: Home / Self Care | Payer: 59 | Source: Intra-hospital | Attending: Psychiatry | Admitting: Psychiatry

## 2017-07-18 DIAGNOSIS — R5383 Other fatigue: Secondary | ICD-10-CM | POA: Diagnosis not present

## 2017-07-18 DIAGNOSIS — R4587 Impulsiveness: Secondary | ICD-10-CM | POA: Diagnosis not present

## 2017-07-18 DIAGNOSIS — R45851 Suicidal ideations: Secondary | ICD-10-CM | POA: Diagnosis present

## 2017-07-18 DIAGNOSIS — F329 Major depressive disorder, single episode, unspecified: Secondary | ICD-10-CM | POA: Insufficient documentation

## 2017-07-18 DIAGNOSIS — IMO0002 Reserved for concepts with insufficient information to code with codable children: Secondary | ICD-10-CM

## 2017-07-18 DIAGNOSIS — Z87891 Personal history of nicotine dependence: Secondary | ICD-10-CM | POA: Insufficient documentation

## 2017-07-18 DIAGNOSIS — Z915 Personal history of self-harm: Secondary | ICD-10-CM | POA: Diagnosis not present

## 2017-07-18 DIAGNOSIS — F410 Panic disorder [episodic paroxysmal anxiety] without agoraphobia: Secondary | ICD-10-CM | POA: Diagnosis present

## 2017-07-18 DIAGNOSIS — F319 Bipolar disorder, unspecified: Secondary | ICD-10-CM | POA: Diagnosis present

## 2017-07-18 DIAGNOSIS — G4700 Insomnia, unspecified: Secondary | ICD-10-CM | POA: Diagnosis present

## 2017-07-18 DIAGNOSIS — F4010 Social phobia, unspecified: Secondary | ICD-10-CM | POA: Diagnosis not present

## 2017-07-18 DIAGNOSIS — Z91040 Latex allergy status: Secondary | ICD-10-CM | POA: Diagnosis not present

## 2017-07-18 DIAGNOSIS — Z885 Allergy status to narcotic agent status: Secondary | ICD-10-CM

## 2017-07-18 DIAGNOSIS — Z79899 Other long term (current) drug therapy: Secondary | ICD-10-CM | POA: Insufficient documentation

## 2017-07-18 DIAGNOSIS — F332 Major depressive disorder, recurrent severe without psychotic features: Secondary | ICD-10-CM | POA: Diagnosis not present

## 2017-07-18 DIAGNOSIS — Z9109 Other allergy status, other than to drugs and biological substances: Secondary | ICD-10-CM

## 2017-07-18 DIAGNOSIS — Z23 Encounter for immunization: Secondary | ICD-10-CM

## 2017-07-18 DIAGNOSIS — R4582 Worries: Secondary | ICD-10-CM | POA: Diagnosis not present

## 2017-07-18 DIAGNOSIS — F419 Anxiety disorder, unspecified: Secondary | ICD-10-CM | POA: Diagnosis not present

## 2017-07-18 DIAGNOSIS — R450 Nervousness: Secondary | ICD-10-CM | POA: Diagnosis not present

## 2017-07-18 DIAGNOSIS — F39 Unspecified mood [affective] disorder: Secondary | ICD-10-CM | POA: Diagnosis not present

## 2017-07-18 DIAGNOSIS — R454 Irritability and anger: Secondary | ICD-10-CM | POA: Diagnosis not present

## 2017-07-18 LAB — COMPREHENSIVE METABOLIC PANEL
ALBUMIN: 4.3 g/dL (ref 3.5–5.0)
ALT: 14 U/L (ref 14–54)
AST: 13 U/L — AB (ref 15–41)
Alkaline Phosphatase: 60 U/L (ref 38–126)
Anion gap: 6 (ref 5–15)
BILIRUBIN TOTAL: 0.5 mg/dL (ref 0.3–1.2)
BUN: 9 mg/dL (ref 6–20)
CHLORIDE: 107 mmol/L (ref 101–111)
CO2: 27 mmol/L (ref 22–32)
Calcium: 9 mg/dL (ref 8.9–10.3)
Creatinine, Ser: 0.57 mg/dL (ref 0.44–1.00)
GFR calc Af Amer: >60 mL/min (ref >60)
GFR calc non Af Amer: >60 mL/min (ref >60)
GLUCOSE: 88 mg/dL (ref 65–99)
POTASSIUM: 4 mmol/L (ref 3.5–5.1)
SODIUM: 140 mmol/L (ref 135–145)
TOTAL PROTEIN: 7.5 g/dL (ref 6.5–8.1)

## 2017-07-18 LAB — CBC
HEMATOCRIT: 43.4 % (ref 36.0–46.0)
Hemoglobin: 14.4 g/dL (ref 12.0–15.0)
MCH: 28.6 pg (ref 26.0–34.0)
MCHC: 33.2 g/dL (ref 30.0–36.0)
MCV: 86.1 fL (ref 78.0–100.0)
Platelets: 257 10*3/uL (ref 150–400)
RBC: 5.04 MIL/uL (ref 3.87–5.11)
RDW: 13.7 % (ref 11.5–15.5)
WBC: 8.4 10*3/uL (ref 4.0–10.5)

## 2017-07-18 LAB — RAPID URINE DRUG SCREEN, HOSP PERFORMED
AMPHETAMINES: NOT DETECTED
Barbiturates: NOT DETECTED
Benzodiazepines: NOT DETECTED
Cocaine: NOT DETECTED
OPIATES: NOT DETECTED
Tetrahydrocannabinol: NOT DETECTED

## 2017-07-18 LAB — ACETAMINOPHEN LEVEL: Acetaminophen (Tylenol), Serum: <10 ug/mL — ABNORMAL LOW (ref 10–30)

## 2017-07-18 LAB — SALICYLATE LEVEL: Salicylate Lvl: <7 mg/dL (ref 2.8–30.0)

## 2017-07-18 LAB — ETHANOL: Alcohol, Ethyl (B): <10 mg/dL (ref <10)

## 2017-07-18 LAB — PREGNANCY, URINE: PREG TEST UR: NEGATIVE

## 2017-07-18 MED ORDER — HYDROXYZINE HCL 25 MG PO TABS
25.0000 mg | ORAL_TABLET | Freq: Three times a day (TID) | ORAL | Status: DC
Start: 2017-07-19 — End: 2017-07-21
  Administered 2017-07-19 – 2017-07-21 (×8): 25 mg via ORAL
  Filled 2017-07-18 (×13): qty 1

## 2017-07-18 MED ORDER — TRAZODONE HCL 50 MG PO TABS
50.0000 mg | ORAL_TABLET | Freq: Every evening | ORAL | Status: DC | PRN
  Administered 2017-07-18: 50 mg via ORAL
  Filled 2017-07-18 (×2): qty 1

## 2017-07-18 MED ORDER — IBUPROFEN 200 MG PO TABS: 600.0000 mg | ORAL_TABLET | Freq: Three times a day (TID) | ORAL | Status: DC | PRN

## 2017-07-18 MED ORDER — ALUM & MAG HYDROXIDE-SIMETH 200-200-20 MG/5ML PO SUSP: 30.0000 mL | ORAL | Status: DC | PRN

## 2017-07-18 MED ORDER — MAGNESIUM HYDROXIDE 400 MG/5ML PO SUSP: 30.0000 mL | Freq: Every day | ORAL | Status: DC | PRN

## 2017-07-18 MED ORDER — INFLUENZA VAC SPLIT QUAD 0.5 ML IM SUSY
0.5000 mL | PREFILLED_SYRINGE | INTRAMUSCULAR | Status: AC
  Administered 2017-07-21: 0.5 mL via INTRAMUSCULAR
  Filled 2017-07-18: qty 0.5

## 2017-07-18 MED ORDER — ACETAMINOPHEN 325 MG PO TABS
650.0000 mg | ORAL_TABLET | Freq: Four times a day (QID) | ORAL | Status: DC | PRN
Start: 2017-07-18 — End: 2017-07-24
  Administered 2017-07-18: 650 mg via ORAL
  Filled 2017-07-18: qty 2

## 2017-07-18 NOTE — Progress Notes (Signed)
Patient accepted to [facility], Bed 405-1.  Jinny Blossom, NP, is the accepting provider, Neita Garnet, MD is the attending.  Pt is voluntary and may transfer via [facility] for admission this evening.  Areatha Keas. Judi Cong, MSW, [place] Disposition Clinical Social Work [phone] (cell) [phone] (office)
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
## 2017-07-18 NOTE — Tx Team (Signed)
Initial Treatment Plan 07/18/2017 11:03 PM KAYDEE MAGEL OIL:[id number]    PATIENT STRESSORS: Financial difficulties Other: 39 year old daughter got married and moved away   PATIENT STRENGTHS: Agricultural engineer for treatment/growth Physical Health Supportive family/friends   PATIENT IDENTIFIED PROBLEMS: Depression  Suicidal ideation  "Be a better mom to my son"  "Try to get better and not be a shut in all the time"               DISCHARGE CRITERIA:  Improved stabilization in mood, thinking, and/or behavior Need for constant or close observation no longer present Verbal commitment to aftercare and medication compliance  PRELIMINARY DISCHARGE PLAN: Outpatient therapy Medication management  PATIENT/FAMILY INVOLVEMENT: This treatment plan has been presented to and reviewed with the patient, MADALYN LEGNER.  The patient and family have been given the opportunity to ask questions and make suggestions.  Windell Moment, RN 07/18/2017, 11:03 PM

## 2017-07-18 NOTE — ED Provider Notes (Signed)
Pt accepted for admission to [facility] by Dr. Parke Poisson.  The pt was reevaluated and is stable for transfer.   Isla Pence, MD 07/18/17 2118

## 2017-07-18 NOTE — ED Notes (Signed)
Pt stated "When I was 16 I took some pills.  This morning when I was in the shower I just had this urge."  Pt pointed to scratch marks to left wrist.

## 2017-07-18 NOTE — ED Notes (Signed)
Attempted to call report, placed on hold, no one answered, called back & placed on hold; will continue to hold.

## 2017-07-18 NOTE — Progress Notes (Signed)
Shannon Kemp is a 41 year old female being admitted voluntarily to [facility] from [facility].  She came to the ED after intentionally attempting to cut wrists in a suicide attempt.  She reported current stressors are her 91 year old daughter recently got married and moved away, Grandmother in law passed away and dealing with ongoing depression for a few months.  She has no history of IP/OP psychiatric treatment.  No medical issues voiced.  During [facility] admission, she continues to report passive SI and will contract for safety on the unit.  She denies HI or A/V hallucinations.  Oriented her to the unit.  Admission paperwork completed and signed.  Belongings searched and no locker needed on admission.  Skin assessment completed and no skin issues noted.  Q 15 minute checks initiated for safety.  We will monitor the progress towards her goals.

## 2017-07-19 DIAGNOSIS — R450 Nervousness: Secondary | ICD-10-CM

## 2017-07-19 DIAGNOSIS — R4582 Worries: Secondary | ICD-10-CM

## 2017-07-19 DIAGNOSIS — G4700 Insomnia, unspecified: Secondary | ICD-10-CM

## 2017-07-19 DIAGNOSIS — R5383 Other fatigue: Secondary | ICD-10-CM

## 2017-07-19 DIAGNOSIS — R4587 Impulsiveness: Secondary | ICD-10-CM

## 2017-07-19 DIAGNOSIS — F4010 Social phobia, unspecified: Secondary | ICD-10-CM

## 2017-07-19 DIAGNOSIS — R45851 Suicidal ideations: Secondary | ICD-10-CM

## 2017-07-19 DIAGNOSIS — F329 Major depressive disorder, single episode, unspecified: Secondary | ICD-10-CM | POA: Diagnosis present

## 2017-07-19 DIAGNOSIS — R454 Irritability and anger: Secondary | ICD-10-CM

## 2017-07-19 DIAGNOSIS — F419 Anxiety disorder, unspecified: Secondary | ICD-10-CM

## 2017-07-19 DIAGNOSIS — F410 Panic disorder [episodic paroxysmal anxiety] without agoraphobia: Secondary | ICD-10-CM

## 2017-07-19 DIAGNOSIS — F39 Unspecified mood [affective] disorder: Secondary | ICD-10-CM

## 2017-07-19 DIAGNOSIS — F319 Bipolar disorder, unspecified: Secondary | ICD-10-CM

## 2017-07-19 MED ORDER — QUETIAPINE FUMARATE 25 MG PO TABS
25.0000 mg | ORAL_TABLET | Freq: Every day | ORAL | Status: DC
  Administered 2017-07-19: 25 mg via ORAL
  Filled 2017-07-19 (×4): qty 1

## 2017-07-19 MED ORDER — VENLAFAXINE HCL ER 37.5 MG PO CP24
37.5000 mg | ORAL_CAPSULE | Freq: Every day | ORAL | Status: DC
  Administered 2017-07-19 – 2017-07-20 (×2): 37.5 mg via ORAL
  Filled 2017-07-19 (×4): qty 1

## 2017-07-19 MED ORDER — QUETIAPINE FUMARATE 50 MG PO TABS
50.0000 mg | ORAL_TABLET | Freq: Every day | ORAL | Status: DC
  Filled 2017-07-19 (×2): qty 1

## 2017-07-19 NOTE — Progress Notes (Signed)
D: Patient denies SI, HI or AVH. Patient has a depressed mood and anxious but pleasant affect.  Pt. States she took medication for sleep last night and feels like she slept better than she has in a while.  Pt. States that she spoke with her husband today and described him as "my rock" and reports that she is thankful for the admission as her and her husband knew, "there was something off".  Pt. Denies any physical complaints, she has been visible on the unit throughout the day and is seen interacting with staff and others. Pt.  Has been attending groups and participating.  A: Patient given emotional support from RN. Patient encouraged to come to staff with concerns and/or questions. Patient's medication routine continued. Patient's orders and plan of care reviewed.   R: Patient remains appropriate and cooperative. Will continue to monitor patient q15 minutes for safety.

## 2017-07-19 NOTE — Progress Notes (Signed)
D: Pt was in the dayroom upon initial approach.  Pt presents with anxious affect and mood.  Reports her day as "been good" and her goal was "keeping my anxiety level down."  Pt reports she is "just still extremely tired."  She reports increased anxiety when her son visited.  Pt denies SI/HI, denies hallucinations, denies pain.  Pt has been visible in milieu interacting with peers and staff appropriately.  Pt attended evening group.    A: Introduced self to pt.  Actively listened to pt and offered support and encouragement. Medication administered per order.  Q15 minute safety checks maintained.  R: Pt is safe on the unit.  Pt is compliant with medication.  Pt verbally contracts for safety.  Will continue to monitor and assess.

## 2017-07-19 NOTE — BHH Group Notes (Signed)
Pt attended and participated in group. Unit rules were discussed and pat was asked how their day went on a scale of 1-10. Pt stated his day was a 5 because of got up exhausted but better in the afternoon playing cards with peers.
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
## 2017-07-19 NOTE — BHH Suicide Risk Assessment (Signed)
[facility] INPATIENT:  Family/Significant Other Suicide Prevention Education  Suicide Prevention Education:  Education Completed; Rain Wilhide, Pt's husband [phone], has been identified by the patient as the family member/significant other with whom the patient will be residing, and identified as the person(s) who will aid the patient in the event of a mental health crisis (suicidal ideations/suicide attempt).  With written consent from the patient, the family member/significant other has been provided the following suicide prevention education, prior to the and/or following the discharge of the patient.  The suicide prevention education provided includes the following:  Suicide risk factors  Suicide prevention and interventions  National Suicide Hotline telephone number  [facility] assessment telephone number  [facility] Emergency Assistance [place] and/or Residential Mobile Crisis Unit telephone number  Request made of family/significant other to:  Remove weapons (e.g., guns, rifles, knives), all items previously/currently identified as safety concern.    Remove drugs/medications (over-the-counter, prescriptions, illicit drugs), all items previously/currently identified as a safety concern.  The family member/significant other verbalizes understanding of the suicide prevention education information provided.  The family member/significant other agrees to remove the items of safety concern listed above.  Pt's husband reports that she has short outbursts of anger at home. He reports that family is supportive of Pt and want her to get better. Pt is interested in having Pt set up for services outpatient. No other concerns noted at this time. Pt can return home at discharge.   Gladstone Lighter 07/19/2017, 3:31 PM

## 2017-07-19 NOTE — H&P (Signed)
Psychiatric Admission Assessment Adult  Patient Identification: Shannon Kemp MRN:  [id number] Date of Evaluation:  07/19/2017 Chief Complaint:  MDD Principal Diagnosis: Bipolar disorder ([place]) Diagnosis:   Patient Active Problem List   Diagnosis Date Noted  . Bipolar disorder ([place]) [F31.9] 07/18/2017   History of Present Illness:  ED [facility] Assessment: 41 y.o. female. She presents to [facility], voluntarily. Patient presents to [facility] after attempting suicide. She made gestures to cut her left wrist. Prior to the gesture of cutting her wrist she texted and called her spouse. Sts her spouse came home in time and removed the knife. Patient has visible red marks on her left wrist (superficial). She admits to suicidal ideations started this morning while she was in shower. The trigger for her stress is related to the following: daughter turning 45 years old and moving out of the home recently, daughter moved 3 hours away, and got married. Also, patient's grandmother in law recently passed away (2 weeks ago). States that her family hasn't had time to grieve. She has no history of prior suicide attempts.  She denies HI. She is calm and cooperative. She reports a history of temper issues but tries to control it by coping mechanisms. She denies legal issues. No AVH's. Patient denies auditory and visual hallucinations. No history of alcohol or drug use. No history of INPT hospitalizations. No outpatient therapist or psychiatrist. Appetite is fair. She sleeps appropriately (6-8 hrs per night). Patient is dressed in scrubs. She is alert and oriented to time, person, place, and situation.  Patient is seen today and is cooperative and appears depressed with flat affect. She reports the same information as above. She reports that she has never sought treatment for her depression even though she has dealt with it since she was 41 y.o.when she attempted to overdose. She has never tried to attempt suicide again until now.  She has never been on medications or diagnosed. She reports being employed at [employer] but got working enough hours. She lost her job after 10 years at [employer] due to them closing. She reports numerous medical issues in the past that have caused her to feel depressed as well. She reports anxiety on a daily basis and rated her depression at 5/10 and anxiety 6/10. She admits passive SI, but denies any current HI/AVH. She reports husband as biggest support system. She shows some Borderline traits with waiting till her husband drove home from work and find her holding the knife to her wrist with red marks present. Her symptoms include depressed mood, loss of joy in normal activities, insomnia, guilt, elevated mood for 2-3 days, excessive spending and "getting in trouble" with her husband, racing thoughts, difficulty concentrating. She also reports getting agitated easily at her family and others and sometimes lashing out.  Associated Signs/Symptoms: Depression Symptoms:  depressed mood, insomnia, fatigue, feelings of worthlessness/guilt, hopelessness, suicidal thoughts with specific plan, anxiety, panic attacks, loss of energy/fatigue, decreased appetite, (Hypo) Manic Symptoms:  Elevated Mood, Financial Extravagance, Impulsivity, Irritable Mood, Anxiety Symptoms:  Excessive Worry, Panic Symptoms, Social Anxiety, Psychotic Symptoms:  Denies PTSD Symptoms: NA Total Time spent with patient: 45 minutes  Past Psychiatric History: Denies any diagnosis 1 previous suicide attempt at 41 years old by overdose  Is the patient at risk to self? No.  Has the patient been a risk to self in the past 6 months? No.  Has the patient been a risk to self within the distant past? Yes.    Is the patient a  risk to others? No.  Has the patient been a risk to others in the past 6 months? No.  Has the patient been a risk to others within the distant past? No.   Prior Inpatient Therapy:   Prior Outpatient  Therapy:    Alcohol Screening: 1. How often do you have a drink containing alcohol?: Monthly or less 2. How many drinks containing alcohol do you have on a typical day when you are drinking?: 1 or 2 3. How often do you have six or more drinks on one occasion?: Never Preliminary Score: 0 9. Have you or someone else been injured as a result of your drinking?: No 10. Has a relative or friend or a doctor or another health worker been concerned about your drinking or suggested you cut down?: No Alcohol Use Disorder Identification Test Final Score (AUDIT): 1 Brief Intervention: AUDIT score less than 7 or less-screening does not suggest unhealthy drinking-brief intervention not indicated Substance Abuse History in the last 12 months:  No. Consequences of Substance Abuse: NA Previous Psychotropic Medications: No  Psychological Evaluations: No  Past Medical History:  Past Medical History:  Diagnosis Date  . Allergy   . Breast pain   . Bruises easily   . Fatigue   . Generalized headaches   . Nipple discharge   . Sleep difficulties   . Weight increase     Past Surgical History:  Procedure Laterality Date  . LAPAROSCOPY  04/01/2012   Procedure: LAPAROSCOPY OPERATIVE;  Surgeon: Claiborne Billings A. Pamala Hurry, MD;  Location: [facility];  Service: Gynecology;  Laterality: N/A;  Operative laparoscopy/right salpingectomy and removal of ectopic pregnancy  . LAPAROSCOPY Left 05/09/2013   Procedure: LAPAROSCOPY OPERATIVE/ECTOPIC;  Surgeon: Linda Hedges, DO;  Location: [facility];  Service: Gynecology;  Laterality: Left;  WITH LEFT SALPINGECTOMY  . ORIF ULNAR FRACTURE    . PLEURAL SCARIFICATION     Family History:  Family History  Problem Relation Age of Onset  . Hypertension Mother   . Hypertension Father    Family Psychiatric  History: Cousin- died from suicide Tobacco Screening: Have you used any form of tobacco in the last 30 days? (Cigarettes, Smokeless Tobacco, Cigars, and/or Pipes): No Social History:   History  Alcohol Use No     History  Drug Use No    Additional Social History:        Allergies:   Allergies  Allergen Reactions  . Morphine And Related Itching and Other (See Comments)    hallucinations  . Adhesive [Tape] Rash  . Latex Rash   Lab Results:  Results for orders placed or performed during the hospital encounter of 07/18/17 (from the past 48 hour(s))  Rapid urine drug screen (hospital performed)     Status: None   Collection Time: 07/18/17  2:24 PM  Result Value Ref Range   Opiates NONE DETECTED NONE DETECTED   Cocaine NONE DETECTED NONE DETECTED   Benzodiazepines NONE DETECTED NONE DETECTED   Amphetamines NONE DETECTED NONE DETECTED   Tetrahydrocannabinol NONE DETECTED NONE DETECTED   Barbiturates NONE DETECTED NONE DETECTED    Comment:        DRUG SCREEN FOR MEDICAL PURPOSES ONLY.  IF CONFIRMATION IS NEEDED FOR ANY PURPOSE, NOTIFY LAB WITHIN 5 DAYS.        LOWEST DETECTABLE LIMITS FOR URINE DRUG SCREEN Drug Class       Cutoff (ng/mL) Amphetamine      1000 Barbiturate      200 Benzodiazepine   200  Tricyclics       244 Opiates          300 Cocaine          300 THC              50   Pregnancy, urine     Status: None   Collection Time: 07/18/17  2:24 PM  Result Value Ref Range   Preg Test, Ur NEGATIVE NEGATIVE    Comment:        THE SENSITIVITY OF THIS METHODOLOGY IS >20 mIU/mL.   Comprehensive metabolic panel     Status: Abnormal   Collection Time: 07/18/17  2:31 PM  Result Value Ref Range   Sodium 140 135 - 145 mmol/L   Potassium 4.0 3.5 - 5.1 mmol/L   Chloride 107 101 - 111 mmol/L   CO2 27 22 - 32 mmol/L   Glucose, Bld 88 65 - 99 mg/dL   BUN 9 6 - 20 mg/dL   Creatinine, Ser 0.57 0.44 - 1.00 mg/dL   Calcium 9.0 8.9 - 10.3 mg/dL   Total Protein 7.5 6.5 - 8.1 g/dL   Albumin 4.3 3.5 - 5.0 g/dL   AST 13 (L) 15 - 41 U/L   ALT 14 14 - 54 U/L   Alkaline Phosphatase 60 38 - 126 U/L   Total Bilirubin 0.5 0.3 - 1.2 mg/dL   GFR calc non Af  Amer >60 >60 mL/min   GFR calc Af Amer >60 >60 mL/min    Comment: (NOTE) The eGFR has been calculated using the CKD EPI equation. This calculation has not been validated in all clinical situations. eGFR's persistently <60 mL/min signify possible Chronic Kidney Disease.    Anion gap 6 5 - 15  Ethanol     Status: None   Collection Time: 07/18/17  2:31 PM  Result Value Ref Range   Alcohol, Ethyl (B) <10 <10 mg/dL    Comment:        LOWEST DETECTABLE LIMIT FOR SERUM ALCOHOL IS 10 mg/dL FOR MEDICAL PURPOSES ONLY   Salicylate level     Status: None   Collection Time: 07/18/17  2:31 PM  Result Value Ref Range   Salicylate Lvl <0.1 2.8 - 30.0 mg/dL  Acetaminophen level     Status: Abnormal   Collection Time: 07/18/17  2:31 PM  Result Value Ref Range   Acetaminophen (Tylenol), Serum <10 (L) 10 - 30 ug/mL    Comment:        THERAPEUTIC CONCENTRATIONS VARY SIGNIFICANTLY. A RANGE OF 10-30 ug/mL MAY BE AN EFFECTIVE CONCENTRATION FOR MANY PATIENTS. HOWEVER, SOME ARE BEST TREATED AT CONCENTRATIONS OUTSIDE THIS RANGE. ACETAMINOPHEN CONCENTRATIONS >150 ug/mL AT 4 HOURS AFTER INGESTION AND >50 ug/mL AT 12 HOURS AFTER INGESTION ARE OFTEN ASSOCIATED WITH TOXIC REACTIONS.   cbc     Status: None   Collection Time: 07/18/17  2:31 PM  Result Value Ref Range   WBC 8.4 4.0 - 10.5 K/uL   RBC 5.04 3.87 - 5.11 MIL/uL   Hemoglobin 14.4 12.0 - 15.0 g/dL   HCT 43.4 36.0 - 46.0 %   MCV 86.1 78.0 - 100.0 fL   MCH 28.6 26.0 - 34.0 pg   MCHC 33.2 30.0 - 36.0 g/dL   RDW 13.7 11.5 - 15.5 %   Platelets 257 150 - 400 K/uL    Blood Alcohol level:  Lab Results  Component Value Date   ETH <10 02/72/5366    Metabolic Disorder Labs:  No results found  for: HGBA1C, MPG No results found for: PROLACTIN No results found for: CHOL, TRIG, HDL, CHOLHDL, VLDL, LDLCALC  Current Medications: Current Facility-Administered Medications  Medication Dose Route Frequency Provider Last Rate Last Dose  .  acetaminophen (TYLENOL) tablet 650 mg  650 mg Oral Q6H PRN Ethelene Hal, NP   650 mg at 07/18/17 2251  . alum & mag hydroxide-simeth (MAALOX/MYLANTA) 200-200-20 MG/5ML suspension 30 mL  30 mL Oral Q4H PRN Ethelene Hal, NP      . hydrOXYzine (ATARAX/VISTARIL) tablet 25 mg  25 mg Oral TID Ethelene Hal, NP   25 mg at 07/19/17 0803  . Influenza vac split quadrivalent PF (FLUARIX) injection 0.5 mL  0.5 mL Intramuscular Tomorrow-1000 Cobos, Fernando A, MD      . magnesium hydroxide (MILK OF MAGNESIA) suspension 30 mL  30 mL Oral Daily PRN Ethelene Hal, NP      . QUEtiapine (SEROQUEL) tablet 50 mg  50 mg Oral QHS Money, Travis B, FNP      . traZODone (DESYREL) tablet 50 mg  50 mg Oral QHS PRN Ethelene Hal, NP   50 mg at 07/18/17 2251  . venlafaxine XR (EFFEXOR-XR) 24 hr capsule 37.5 mg  37.5 mg Oral Daily Money, Lowry Ram, FNP       PTA Medications: Prescriptions Prior to Admission  Medication Sig Dispense Refill Last Dose  . acetaminophen (TYLENOL) 500 MG tablet Take 1,000 mg by mouth every 8 (eight) hours as needed (pain).   Past Week at Unknown time  . hydroxypropyl methylcellulose / hypromellose (ISOPTO TEARS / GONIOVISC) 2.5 % ophthalmic solution Place 1 drop into both eyes 3 (three) times daily as needed for dry eyes.   Past Week at Unknown time  . ibuprofen (ADVIL) 600 MG tablet Take 1 tablet (600 mg total) by mouth every 6 (six) hours as needed for pain. 30 tablet 0 Past Week at Unknown time  . Multiple Vitamin (MULTIVITAMIN WITH MINERALS) TABS tablet Take 1 tablet by mouth daily.   07/18/2017 at Unknown time  . naproxen (NAPROSYN) 500 MG tablet Take 1 tablet (500 mg total) by mouth 2 (two) times daily. (Patient not taking: Reported on 07/18/2017) 30 tablet 0 Completed Course at Unknown time  . ondansetron (ZOFRAN ODT) 4 MG disintegrating tablet Take 1 tablet (4 mg total) by mouth every 8 (eight) hours as needed for nausea or vomiting. (Patient not  taking: Reported on 06/26/2017) 20 tablet 0 Completed Course at Unknown time    Musculoskeletal: Strength & Muscle Tone: within normal limits Gait & Station: normal Patient leans: N/A  Psychiatric Specialty Exam: Physical Exam  Nursing note and vitals reviewed. Constitutional: She is oriented to person, place, and time. She appears well-developed and well-nourished.  Cardiovascular: Normal rate.   Respiratory: Effort normal.  Musculoskeletal: Normal range of motion.  Neurological: She is alert and oriented to person, place, and time.  Skin: Skin is warm.    Review of Systems  Constitutional: Negative.   HENT: Negative.   Eyes: Negative.   Respiratory: Negative.   Cardiovascular: Negative.   Gastrointestinal: Negative.   Genitourinary: Negative.   Musculoskeletal: Negative.   Skin: Negative.   Neurological: Negative.   Endo/Heme/Allergies: Negative.   Psychiatric/Behavioral: Positive for depression. Negative for hallucinations, substance abuse and suicidal ideas. The patient is nervous/anxious and has insomnia.     Blood pressure 100/74, pulse (!) 134, temperature 98.7 F (37.1 C), temperature source Oral, resp. rate 16, height 5' 5.5" (1.664 m), weight  66.7 kg (147 lb), last menstrual period 07/11/2017, SpO2 98 %, unknown if currently breastfeeding.Body mass index is 24.09 kg/m.  General Appearance: Casual  Eye Contact:  Good  Speech:  Clear and Coherent and Normal Rate  Volume:  Normal  Mood:  Depressed  Affect:  Depressed and Flat  Thought Process:  Goal Directed and Descriptions of Associations: Intact  Orientation:  Full (Time, Place, and Person)  Thought Content:  WDL  Suicidal Thoughts:  No  Homicidal Thoughts:  No  Memory:  Immediate;   Good Recent;   Good Remote;   Good  Judgement:  Good  Insight:  Good  Psychomotor Activity:  Normal  Concentration:  Concentration: Good and Attention Span: Good  Recall:  Good  Fund of Knowledge:  Good  Language:  Good   Akathisia:  No  Handed:  Right  AIMS (if indicated):     Assets:  Communication Skills Desire for Improvement Financial Resources/Insurance Housing Physical Health Social Support Transportation  ADL's:  Intact  Cognition:  WNL  Sleep:  Number of Hours: 4.75    Treatment Plan Summary: Daily contact with patient to assess and evaluate symptoms and progress in treatment, Medication management and Plan is to: -Start Effexor-XR 37.5 mg PO Daily for mood stability -Start Seroquel 50 mg PO QHS for mood stability -See MAR and SRA for further medication management -Encourage group therapy participation  Observation Level/Precautions:  15 minute checks  Laboratory:  reviewed  Psychotherapy:  Group therapy  Medications:  See MAR  Consultations:  As needed  Discharge Concerns:  Compliance  Estimated LOS: 3-5 Days  Other:  Admit to [place] for Primary Diagnosis: Bipolar disorder ([place]) Long Term Goal(s): Improvement in symptoms so as ready for discharge  Short Term Goals: Ability to verbalize feelings will improve and Ability to disclose and discuss suicidal ideas  Physician Treatment Plan for Secondary Diagnosis: Principal Problem:   Bipolar disorder ([place])  Long Term Goal(s): Improvement in symptoms so as ready for discharge  Short Term Goals: Ability to identify and develop effective coping behaviors will improve, Ability to maintain clinical measurements within normal limits will improve and Compliance with prescribed medications will improve  I certify that inpatient services furnished can reasonably be expected to improve the patient's condition.    Lewis Shock, FNP 10/17/201812:38 PM   I have discussed case with NP and have met with patient  Agree with NP note and assessment  41 year old married female, two children, employed.  Patient reports history of depression, feels it has worsened since her husband's grandmother passed away. She  presented to ED voluntarily after telling her husband that she was thinking of cutting herself. States she actually had a knife against her wrist and has a superficial non bleeding cut/scratch on L wrist area. Reports neuro-vegetative symptoms- describes poor sleep, poor energy level, some anhedonia.   She has attempted suicide once before at age 21 by overdosing, no prior psychiatric admissions, has a history of chronic depression, which she states is intermittent. No history of psychosis, no clear history of mania or hypomania, but reports history of brief mood swings that last several hours . Denies panic , some agoraphobia. Reports symptoms of social anxiety . States she has never been on psychiatric medications in the past .    Denies alcohol, denies drug abuse   Denies medical illnesses . Was not taking any medications prior to admission.  Dx- MDD, no psychotic features  Plan - Inpatient admission .  Patient has been started on Effexor XR trial.  Patient has also been started Seroquel 25 mgrs QHS, for insomnia and tendency towards anxious ruminations and subjective agitation at night time.
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
## 2017-07-19 NOTE — BHH Group Notes (Signed)
[facility] Group Therapy 07/19/2017 1:15pm  Type of Therapy: Mental Health Association Presentation  Participation Level: Active  Participation Quality: Attentive  Affect: Appropriate  Cognitive: Oriented  Insight: Developing/Improving  Engagement in Therapy: Engaged  Modes of Intervention: Discussion, Education and Socialization  Summary of Progress/Problems: [place] Speaker came to talk about his personal journey with mental health. The pt processed ways by which to relate to the speaker. [place] speaker provided handouts and educational information pertaining to groups and services offered by the [facility]. Pt was engaged in speaker's presentation and was receptive to resources provided.    Gladstone Lighter, LCSW 07/19/2017 2:36 PM

## 2017-07-19 NOTE — Plan of Care (Signed)
Problem: Safety: Goal: Periods of time without injury will increase Outcome: Progressing Pt has not harmed self or others tonight.  She denies SI/HI and verbally contracts for safety.

## 2017-07-19 NOTE — Progress Notes (Signed)
Recreation Therapy Notes  Date:  07/19/17  Time: 0930 Location: [address] Dayroom  Group Topic: Stress Management  Goal Area(s) Addresses:  Patient will verbalize importance of using healthy stress management.  Patient will identify positive emotions associated with healthy stress management.   Intervention: Stress Management  Activity :  Meditation.  LRT introduced the stress management technique of meditation.  LRT played a meditation from the Calm app to allow patients to focus on the forgiveness of others.  Patients followed along as LRT played meditation.  Education:  Stress Management, Discharge Planning.   Education Outcome: Acknowledges edcuation/In group clarification offered/Needs additional education  Clinical Observations/Feedback: Pt did not attend group.    Victorino Sparrow, LRT/CTRS         Victorino Sparrow A 07/19/2017 12:15 PM

## 2017-07-20 LAB — TSH: TSH: 1.684 u[IU]/mL (ref 0.350–4.500)

## 2017-07-20 MED ORDER — VENLAFAXINE HCL ER 75 MG PO CP24
75.0000 mg | ORAL_CAPSULE | Freq: Every day | ORAL | Status: DC
  Administered 2017-07-21 – 2017-07-24 (×4): 75 mg via ORAL
  Filled 2017-07-20 (×7): qty 1

## 2017-07-20 MED ORDER — MIRTAZAPINE 7.5 MG PO TABS
7.5000 mg | ORAL_TABLET | Freq: Every day | ORAL | Status: DC
  Administered 2017-07-20 – 2017-07-23 (×4): 7.5 mg via ORAL
  Filled 2017-07-20 (×7): qty 1

## 2017-07-20 NOTE — Progress Notes (Signed)
[facility] MD Progress Note  07/20/2017 4:10 PM Shannon Kemp  MRN:  [id number] Subjective:  Patient states she feels " a little better ", states she is still feeling depressed. Denies any suicidal ideations today. Reports ongoing insomnia. Denies medication side effects. Objective : I have discussed case with treatment team and have met with patient . Patient reports some improvement but describes ongoing sadness, depression and some anxiety. States she does feel " a little better" when she is in day room, interacting with others. She has been visible in day room, interactive with peers .No disruptive behaviors . Denies medication side effects. Denies suicidal ideations. Reports decreasing self injurious ideations, has brief , fleeting thoughts of cutting " to feel something, not to die", but states she has no intention of hurting self, contracts for safety on unit. Labs- TSH 1.68  Principal Problem: Major depression Diagnosis:   Patient Active Problem List   Diagnosis Date Noted  . Major depression [F32.9] 07/19/2017  . Bipolar disorder ([place]) [F31.9] 07/18/2017   Total Time spent with patient: 20 minutes  Past Medical History:  Past Medical History:  Diagnosis Date  . Allergy   . Breast pain   . Bruises easily   . Fatigue   . Generalized headaches   . Nipple discharge   . Sleep difficulties   . Weight increase     Past Surgical History:  Procedure Laterality Date  . LAPAROSCOPY  04/01/2012   Procedure: LAPAROSCOPY OPERATIVE;  Surgeon: Claiborne Billings A. Pamala Hurry, MD;  Location: [facility];  Service: Gynecology;  Laterality: N/A;  Operative laparoscopy/right salpingectomy and removal of ectopic pregnancy  . LAPAROSCOPY Left 05/09/2013   Procedure: LAPAROSCOPY OPERATIVE/ECTOPIC;  Surgeon: Linda Hedges, DO;  Location: [facility];  Service: Gynecology;  Laterality: Left;  WITH LEFT SALPINGECTOMY  . ORIF ULNAR FRACTURE    . PLEURAL SCARIFICATION     Family History:  Family History  Problem Relation  Age of Onset  . Hypertension Mother   . Hypertension Father    Social History:  History  Alcohol Use No     History  Drug Use No    Social History   Social History  . Marital status: Married    Spouse name: N/A  . Number of children: N/A  . Years of education: N/A   Social History Main Topics  . Smoking status: Former Research scientist (life sciences)  . Smokeless tobacco: Never Used  . Alcohol use No  . Drug use: No  . Sexual activity: Not Currently   Other Topics Concern  . None   Social History Narrative  . None   Additional Social History:   Sleep: Fair  Appetite:  Fair  Current Medications: Current Facility-Administered Medications  Medication Dose Route Frequency Provider Last Rate Last Dose  . acetaminophen (TYLENOL) tablet 650 mg  650 mg Oral Q6H PRN Ethelene Hal, NP   650 mg at 07/18/17 2251  . alum & mag hydroxide-simeth (MAALOX/MYLANTA) 200-200-20 MG/5ML suspension 30 mL  30 mL Oral Q4H PRN Ethelene Hal, NP      . hydrOXYzine (ATARAX/VISTARIL) tablet 25 mg  25 mg Oral TID Ethelene Hal, NP   25 mg at 07/20/17 1141  . Influenza vac split quadrivalent PF (FLUARIX) injection 0.5 mL  0.5 mL Intramuscular Tomorrow-1000 Tanikka Bresnan A, MD      . magnesium hydroxide (MILK OF MAGNESIA) suspension 30 mL  30 mL Oral Daily PRN Ethelene Hal, NP      . QUEtiapine (SEROQUEL) tablet  25 mg  25 mg Oral QHS Labrea Eccleston, Myer Peer, MD   25 mg at 07/19/17 2102  . traZODone (DESYREL) tablet 50 mg  50 mg Oral QHS PRN Ethelene Hal, NP   50 mg at 07/18/17 2251  . venlafaxine XR (EFFEXOR-XR) 24 hr capsule 37.5 mg  37.5 mg Oral Daily Money, Lowry Ram, FNP   37.5 mg at 07/20/17 0131    Lab Results:  Results for orders placed or performed during the hospital encounter of 07/18/17 (from the past 48 hour(s))  TSH     Status: None   Collection Time: 07/20/17  6:37 AM  Result Value Ref Range   TSH 1.684 0.350 - 4.500 uIU/mL    Comment: Performed by a 3rd Generation  assay with a functional sensitivity of <=0.01 uIU/mL. Performed at [facility], [place] [address], [place], [place] [address]     Blood Alcohol level:  Lab Results  Component Value Date   ETH <10 75/79/7282    Metabolic Disorder Labs: No results found for: HGBA1C, MPG No results found for: PROLACTIN No results found for: CHOL, TRIG, HDL, CHOLHDL, VLDL, LDLCALC  Physical Findings: AIMS: Facial and Oral Movements Muscles of Facial Expression: None, normal Lips and Perioral Area: None, normal Jaw: None, normal Tongue: None, normal,Extremity Movements Upper (arms, wrists, hands, fingers): None, normal Lower (legs, knees, ankles, toes): None, normal, Trunk Movements Neck, shoulders, hips: None, normal, Overall Severity Severity of abnormal movements (highest score from questions above): None, normal Incapacitation due to abnormal movements: None, normal Patient's awareness of abnormal movements (rate only patient's report): No Awareness, Dental Status Current problems with teeth and/or dentures?: No Does patient usually wear dentures?: No  CIWA:    COWS:     Musculoskeletal: Strength & Muscle Tone: within normal limits Gait & Station: normal Patient leans: N/A  Psychiatric Specialty Exam: Physical Exam  ROS denies headache, no chest pain, no dyspnea   Blood pressure (!) 127/92, pulse (!) 102, temperature 97.7 F (36.5 C), temperature source Oral, resp. rate 18, height 5' 5.5" (1.664 m), weight 66.7 kg (147 lb), last menstrual period 07/11/2017, SpO2 98 %, unknown if currently breastfeeding.Body mass index is 24.09 kg/m.  General Appearance: Fairly Groomed  Eye Contact:  Fair  Speech:  Normal Rate  Volume:  Normal  Mood:  Depressed  Affect:  remains constricted, vaguely anxious   Thought Process:  Linear and Descriptions of Associations: Intact  Orientation:  Other:  fully alert and attentive   Thought Content:  no hallucinations, no delusions , not  internally preoccupied   Suicidal Thoughts:  No denies suicidal or self injurious ideations at this time, denies homicidal ideations  Homicidal Thoughts:  No   Memory:  recent and remote grossly intact   Judgement:  Fair  Insight:  Fair  Psychomotor Activity:  Normal  Concentration:  Concentration: Good and Attention Span: Good  Recall:  Good  Fund of Knowledge:  Good  Language:  Good  Akathisia:  No  Handed:  Right  AIMS (if indicated):     Assets:  Communication Skills Desire for Improvement Resilience  ADL's:  Intact  Cognition:  WNL  Sleep:  Number of Hours: 4.75   Assessment - patient remains vaguely depressed, constricted in affect. At this time denies suicidal ideations and contracts for safety. Denies medication side effects, but states she woke up several times during the night and slept poorly in spite of Seroquel. Visible in day room, interactive with peers .  Treatment Plan Summary: Daily contact with patient to assess and evaluate symptoms and progress in treatment, Medication management, Plan inpatient admission and medications as below Encourage group and milieu participation to work on coping skills and symptom reduction Increase Effexor XR to 75 mgrs QDAY for depression, anxiety D/C Seroquel - see above Start Remeron 7.5 mgrs QHS for insomnia and depression Treatment team working on disposition planning options Jenne Campus, MD 07/20/2017, 4:10 PM

## 2017-07-20 NOTE — Progress Notes (Signed)
D: Patient denies SI, HI or AVH. Patient has a depressed mood and anxious affect, states that she did not sleep well last night despite medication.  Pt. States that she is still feeling depressed but she brightens with interaction and is visualized in the dayroom interacting with staff and others.  Pt. States that her appetite is good but reports that her energy level is still low.  Pt. Denies any physical complaints.  A: Patient given emotional support from RN. Patient encouraged to come to staff with concerns and/or questions. Patient's medication routine continued. Patient's orders and plan of care reviewed.   R: Patient remains appropriate and cooperative. Will continue to monitor patient q15 minutes for safety.

## 2017-07-20 NOTE — [facility] Group Notes (Signed)
LCSW Group Therapy Note  07/20/2017 1:15pm  Type of Therapy/Topic:  Group Therapy:  Balance in Life  Participation Level: Minimal   Description of Group:    This group will address the concept of balance and how it feels and looks when one is unbalanced. Patients will be encouraged to process areas in their lives that are out of balance and identify reasons for remaining unbalanced. Facilitators will guide patients in utilizing problem-solving interventions to address and correct the stressor making their life unbalanced. Understanding and applying boundaries will be explored and addressed for obtaining and maintaining a balanced life. Patients will be encouraged to explore ways to assertively make their unbalanced needs known to significant others in their lives, using other group members and facilitator for support and feedback.  Therapeutic Goals: 1. Patient will identify two or more emotions or situations they have that consume much of in their lives. 2. Patient will identify signs/triggers that life has become out of balance:  3. Patient will identify two ways to set boundaries in order to achieve balance in their lives:  4. Patient will demonstrate ability to communicate their needs through discussion and/or role plays  Summary of Patient Progress: Pt was present for the duration of the group. Pt states that being in nature helps her to feel more balanced. Pt spoke about how being able to unplug and truly relax helps her to feel peaceful and at ease. Pt did not contribute often to the discussion but listened attentively to others as they shared.    Therapeutic Modalities:   Cognitive Behavioral Therapy Solution-Focused Therapy Assertiveness Training  Georga Kaufmann, MSW, [place] 07/20/2017 3:15 PM

## 2017-07-21 DIAGNOSIS — F332 Major depressive disorder, recurrent severe without psychotic features: Secondary | ICD-10-CM

## 2017-07-21 MED ORDER — HYDROXYZINE HCL 50 MG PO TABS
50.0000 mg | ORAL_TABLET | Freq: Three times a day (TID) | ORAL | Status: DC
  Administered 2017-07-21 – 2017-07-24 (×9): 50 mg via ORAL
  Filled 2017-07-21 (×16): qty 1

## 2017-07-21 NOTE — [facility] Group Notes (Signed)
LCSW Group Therapy Note  07/21/2017 1:15pm  Type of Therapy and Topic:  Group Therapy:  Feelings around Relapse and Recovery  Participation Level: Active   Description of Group:    Patients in this group will discuss emotions they experience before and after a relapse. They will process how experiencing these feelings, or avoidance of experiencing them, relates to having a relapse. Facilitator will guide patients to explore emotions they have related to recovery. Patients will be encouraged to process which emotions are more powerful. They will be guided to discuss the emotional reaction significant others in their lives may have to their relapse or recovery. Patients will be assisted in exploring ways to respond to the emotions of others without this contributing to a relapse.  Therapeutic Goals: 1. Patient will identify two or more emotions that lead to a relapse for them 2. Patient will identify two emotions that result when they relapse 3. Patient will identify two emotions related to recovery 4. Patient will demonstrate ability to communicate their needs through discussion and/or role plays   Therapeutic Modalities:   Cognitive Behavioral Therapy Solution-Focused Therapy Assertiveness Training Relapse Prevention Therapy   Georga Kaufmann, MSW, [facility] 07/21/2017 4:43 PM

## 2017-07-21 NOTE — Progress Notes (Signed)
Recreation Therapy Notes  Date:  07/21/17  Time: 1000 Location: 500 Hall Dayroom  Group Topic: Stress Management  Goal Area(s) Addresses:  Patient will verbalize importance of using healthy stress management.  Patient will identify positive emotions associated with healthy stress management.   Behavioral Response: Engaged  Intervention: Stress Management  Activity :  Guided Imagery.  LRT introduced the stress management technique of guided imagery.  Patients were to listen and follow along as LRT read script for patients to embark on a mental vacation.  Education:  Stress Management, Discharge Planning.   Education Outcome: Acknowledges edcuation/In group clarification offered/Needs additional education  Clinical Observations/Feedback: Pt attended group.    Victorino Sparrow, LRT/CTRS         Victorino Sparrow A 07/21/2017 11:43 AM

## 2017-07-21 NOTE — Plan of Care (Signed)
Problem: Education: Goal: Ability to make informed decisions regarding treatment will improve Outcome: Progressing Patient taking medications as prescribed.  Problem: Activity: Goal: Interest or engagement in leisure activities will improve Outcome: Progressing Patient has been attending unit activities and participating in the milieu. Goal: Imbalance in normal sleep/wake cycle will improve Outcome: Progressing Patient reports she slept well last night.

## 2017-07-21 NOTE — Progress Notes (Signed)
[facility] MD Progress Note  07/21/2017 3:12 PM Shannon Kemp  MRN:  [id number]   Subjective:  Patient reports that she is feeling better today. She denies any SI/HI/AVH. She still reports some depression and a new problem of agitation. She reports having this issue for a long time but is just now reporting that she has anger and agitation at home and throws things. "My kids go hide when I get mad." She denies hitting or hurting anyone in the house. She contracts for safety on the unit.  Objective: Patient's chart and findings reviewed and discussed with treatment team. Patient presents pleasant and cooperative. She has appropriate affect and mood. She has been seen in the day room most of the day interacting appropriately with others. She has been attending group and participating as well. Discussed increasing Effexor ad Vistaril, but Effexor was jsut increase d for today, so the Vistaril was increased today to 50 mg.  Principal Problem: Major depression Diagnosis:   Patient Active Problem List   Diagnosis Date Noted  . Major depression [F32.9] 07/19/2017  . Bipolar disorder ([place]) [F31.9] 07/18/2017   Total Time spent with patient: 25 minutes  Past Psychiatric History: See H&P  Past Medical History:  Past Medical History:  Diagnosis Date  . Allergy   . Breast pain   . Bruises easily   . Fatigue   . Generalized headaches   . Nipple discharge   . Sleep difficulties   . Weight increase     Past Surgical History:  Procedure Laterality Date  . LAPAROSCOPY  04/01/2012   Procedure: LAPAROSCOPY OPERATIVE;  Surgeon: Claiborne Billings A. Pamala Hurry, MD;  Location: [facility];  Service: Gynecology;  Laterality: N/A;  Operative laparoscopy/right salpingectomy and removal of ectopic pregnancy  . LAPAROSCOPY Left 05/09/2013   Procedure: LAPAROSCOPY OPERATIVE/ECTOPIC;  Surgeon: Linda Hedges, DO;  Location: [facility];  Service: Gynecology;  Laterality: Left;  WITH LEFT SALPINGECTOMY  . ORIF ULNAR FRACTURE    . PLEURAL  SCARIFICATION     Family History:  Family History  Problem Relation Age of Onset  . Hypertension Mother   . Hypertension Father    Family Psychiatric  History: See H&P Social History:  History  Alcohol Use No     History  Drug Use No    Social History   Social History  . Marital status: Married    Spouse name: N/A  . Number of children: N/A  . Years of education: N/A   Social History Main Topics  . Smoking status: Former Research scientist (life sciences)  . Smokeless tobacco: Never Used  . Alcohol use No  . Drug use: No  . Sexual activity: Not Currently   Other Topics Concern  . None   Social History Narrative  . None   Additional Social History:                         Sleep: Good  Appetite:  Good  Current Medications: Current Facility-Administered Medications  Medication Dose Route Frequency Provider Last Rate Last Dose  . acetaminophen (TYLENOL) tablet 650 mg  650 mg Oral Q6H PRN Ethelene Hal, NP   650 mg at 07/18/17 2251  . alum & mag hydroxide-simeth (MAALOX/MYLANTA) 200-200-20 MG/5ML suspension 30 mL  30 mL Oral Q4H PRN Ethelene Hal, NP      . hydrOXYzine (ATARAX/VISTARIL) tablet 50 mg  50 mg Oral TID Money, Darnelle Maffucci B, FNP      . magnesium hydroxide (MILK OF  MAGNESIA) suspension 30 mL  30 mL Oral Daily PRN Ethelene Hal, NP      . mirtazapine (REMERON) tablet 7.5 mg  7.5 mg Oral QHS Cobos, Myer Peer, MD   7.5 mg at 07/20/17 2137  . venlafaxine XR (EFFEXOR-XR) 24 hr capsule 75 mg  75 mg Oral Daily Cobos, Myer Peer, MD   75 mg at 07/21/17 0803    Lab Results:  Results for orders placed or performed during the hospital encounter of 07/18/17 (from the past 48 hour(s))  TSH     Status: None   Collection Time: 07/20/17  6:37 AM  Result Value Ref Range   TSH 1.684 0.350 - 4.500 uIU/mL    Comment: Performed by a 3rd Generation assay with a functional sensitivity of <=0.01 uIU/mL. Performed at [facility], [place] [address]  [place],  [address]     Blood Alcohol level:  Lab Results  Component Value Date   ETH <10 76/28/3151    Metabolic Disorder Labs: No results found for: HGBA1C, MPG No results found for: PROLACTIN No results found for: CHOL, TRIG, HDL, CHOLHDL, VLDL, LDLCALC  Physical Findings: AIMS: Facial and Oral Movements Muscles of Facial Expression: None, normal Lips and Perioral Area: None, normal Jaw: None, normal Tongue: None, normal,Extremity Movements Upper (arms, wrists, hands, fingers): None, normal Lower (legs, knees, ankles, toes): None, normal, Trunk Movements Neck, shoulders, hips: None, normal, Overall Severity Severity of abnormal movements (highest score from questions above): None, normal Incapacitation due to abnormal movements: None, normal Patient's awareness of abnormal movements (rate only patient's report): No Awareness, Dental Status Current problems with teeth and/or dentures?: No Does patient usually wear dentures?: No  CIWA:    COWS:     Musculoskeletal: Strength & Muscle Tone: within normal limits Gait & Station: normal Patient leans: N/A  Psychiatric Specialty Exam: Physical Exam  Nursing note and vitals reviewed. Constitutional: She is oriented to person, place, and time. She appears well-developed and well-nourished.  Respiratory: Effort normal.  Musculoskeletal: Normal range of motion.  Neurological: She is alert and oriented to person, place, and time.  Skin: Skin is warm.    Review of Systems  Constitutional: Negative.   HENT: Negative.   Eyes: Negative.   Respiratory: Negative.   Cardiovascular: Negative.   Gastrointestinal: Negative.   Genitourinary: Negative.   Musculoskeletal: Negative.   Skin: Negative.   Neurological: Negative.   Endo/Heme/Allergies: Negative.   Psychiatric/Behavioral: Positive for depression. Negative for hallucinations and suicidal ideas.    Blood pressure (!) 127/92, pulse (!) 102, temperature 97.7 F (36.5 C),  temperature source Oral, resp. rate 18, height 5' 5.5" (1.664 m), weight 66.7 kg (147 lb), last menstrual period 07/11/2017, SpO2 98 %, unknown if currently breastfeeding.Body mass index is 24.09 kg/m.  General Appearance: Casual  Eye Contact:  Good  Speech:  Clear and Coherent and Normal Rate  Volume:  Normal  Mood:  Depressed  Affect:  Flat  Thought Process:  Goal Directed and Descriptions of Associations: Intact  Orientation:  Full (Time, Place, and Person)  Thought Content:  WDL  Suicidal Thoughts:  No  Homicidal Thoughts:  No  Memory:  Immediate;   Good Recent;   Good Remote;   Good  Judgement:  Fair  Insight:  Good  Psychomotor Activity:  Normal  Concentration:  Concentration: Good and Attention Span: Good  Recall:  Good  Fund of Knowledge:  Good  Language:  Good  Akathisia:  No  Handed:  Right  AIMS (if indicated):     Assets:  Communication Skills Desire for Improvement Financial Resources/Insurance [place] [employer]  ADL's:  Intact  Cognition:  WNL  Sleep:  Number of Hours: 6.75     Treatment Plan Summary: Daily contact with patient to assess and evaluate symptoms and progress in treatment, Medication management and Plan is to:  -Continue Effexor-XR 75 mg PO [place] for mood stability -Continue Mirtazapine 7.5 mg PO QHS for mood stability and sleep -Increase Vistaril 50 mg PO TID for anxiety/agitation -Encourage group therapy participation    Lewis Shock, FNP 07/21/2017, 3:12 PM   Agree with NP Progress Note

## 2017-07-21 NOTE — Progress Notes (Signed)
Data. Patient denies SI/HI/AVH. Verbally contracts for safety on the unit and to come to staff before acting of any self harm thoughts/feelings.  Patient interacting well with staff and other patients. Affcet is flat but brightens with interaction. On her self assessment patient reports 4/10 for depression, 0/10 for hopelessness and 3/10 for anxiety. Her goal for today is: "To sit down and think about how I am doing." Action. Emotional support and encouragement offered. Education provided on medication, indications and side effect. Q 15 minute checks done for safety. Response. Safety on the unit maintained through 15 minute checks.  Medications taken as prescribed. Attended groups. Remained calm and appropriate through out shift.

## 2017-07-21 NOTE — Progress Notes (Signed)
D: Pt observed in the dayroom not interacting. Pt at the time of assessment endorsed moderate depression and anxiety; "I am always depressed and anxious; I need to start being positive." Pt remained calm and cooperative. Pt denied SI, HI, pain or AVH A: Medications offered as prescribed. Pt given the opportunity to ask questions and state concerns. Support, encouragement, and safe environment provided. R: Pt was med compliant. All patient's questions and concerns addressed. 15-minute safety checks continue. Safety checks continue. Pt did attend wrap-up group.

## 2017-07-21 NOTE — Tx Team (Signed)
Interdisciplinary Treatment and Diagnostic Plan Update 07/21/2017 Time of Session: 9:30am  Shannon Kemp  MRN: [id number]  Principal Diagnosis: Major depression  Secondary Diagnoses: Principal Problem:   Major depression Active Problems:   Bipolar disorder ([place])   Current Medications:  Current Facility-Administered Medications  Medication Dose Route Frequency Provider Last Rate Last Dose  . acetaminophen (TYLENOL) tablet 650 mg  650 mg Oral Q6H PRN Ethelene Hal, NP   650 mg at 07/18/17 2251  . alum & mag hydroxide-simeth (MAALOX/MYLANTA) 200-200-20 MG/5ML suspension 30 mL  30 mL Oral Q4H PRN Ethelene Hal, NP      . hydrOXYzine (ATARAX/VISTARIL) tablet 25 mg  25 mg Oral TID Ethelene Hal, NP   25 mg at 07/21/17 0802  . Influenza vac split quadrivalent PF (FLUARIX) injection 0.5 mL  0.5 mL Intramuscular Tomorrow-1000 Cobos, Fernando A, MD      . magnesium hydroxide (MILK OF MAGNESIA) suspension 30 mL  30 mL Oral Daily PRN Ethelene Hal, NP      . mirtazapine (REMERON) tablet 7.5 mg  7.5 mg Oral QHS Cobos, Myer Peer, MD   7.5 mg at 07/20/17 2137  . venlafaxine XR (EFFEXOR-XR) 24 hr capsule 75 mg  75 mg Oral Daily Cobos, Myer Peer, MD   75 mg at 07/21/17 0803    PTA Medications: Prescriptions Prior to Admission  Medication Sig Dispense Refill Last Dose  . acetaminophen (TYLENOL) 500 MG tablet Take 1,000 mg by mouth every 8 (eight) hours as needed (pain).   Past Week at Unknown time  . hydroxypropyl methylcellulose / hypromellose (ISOPTO TEARS / GONIOVISC) 2.5 % ophthalmic solution Place 1 drop into both eyes 3 (three) times daily as needed for dry eyes.   Past Week at Unknown time  . ibuprofen (ADVIL) 600 MG tablet Take 1 tablet (600 mg total) by mouth every 6 (six) hours as needed for pain. 30 tablet 0 Past Week at Unknown time  . Multiple Vitamin (MULTIVITAMIN WITH MINERALS) TABS tablet Take 1 tablet by mouth daily.   07/18/2017 at Unknown time   . naproxen (NAPROSYN) 500 MG tablet Take 1 tablet (500 mg total) by mouth 2 (two) times daily. (Patient not taking: Reported on 07/18/2017) 30 tablet 0 Completed Course at Unknown time  . ondansetron (ZOFRAN ODT) 4 MG disintegrating tablet Take 1 tablet (4 mg total) by mouth every 8 (eight) hours as needed for nausea or vomiting. (Patient not taking: Reported on 06/26/2017) 20 tablet 0 Completed Course at Unknown time    Treatment Modalities: Medication Management, Group therapy, Case management,  1 to 1 session with clinician, Psychoeducation, Recreational therapy.  Patient Stressors: Financial difficulties Other: 8 year old daughter got married and moved away Patient Strengths: Agricultural engineer for treatment/growth Physical Health Supportive family/friends  Physician Treatment Plan for Primary Diagnosis: Major depression Long Term Goal(s): Improvement in symptoms so as ready for discharge Short Term Goals: Ability to verbalize feelings will improve Ability to disclose and discuss suicidal ideas Ability to identify and develop effective coping behaviors will improve Ability to maintain clinical measurements within normal limits will improve Compliance with prescribed medications will improve  Medication Management: Evaluate patient's response, side effects, and tolerance of medication regimen.  Therapeutic Interventions: 1 to 1 sessions, Unit Group sessions and Medication administration.  Evaluation of Outcomes: Progressing  Physician Treatment Plan for Secondary Diagnosis: Principal Problem:   Major depression Active Problems:   Bipolar disorder ([place])  Long Term Goal(s): Improvement in symptoms so  as ready for discharge  Short Term Goals: Ability to verbalize feelings will improve Ability to disclose and discuss suicidal ideas Ability to identify and develop effective coping behaviors will improve Ability to maintain clinical measurements within normal limits  will improve Compliance with prescribed medications will improve  Medication Management: Evaluate patient's response, side effects, and tolerance of medication regimen.  Therapeutic Interventions: 1 to 1 sessions, Unit Group sessions and Medication administration.  Evaluation of Outcomes: Progressing  RN Treatment Plan for Primary Diagnosis: Major depression Long Term Goal(s): Knowledge of disease and therapeutic regimen to maintain health will improve  Short Term Goals: Ability to verbalize feelings will improve and Compliance with prescribed medications will improve  Medication Management: RN will administer medications as ordered by provider, will assess and evaluate patient's response and provide education to patient for prescribed medication. RN will report any adverse and/or side effects to prescribing provider.  Therapeutic Interventions: 1 on 1 counseling sessions, Psychoeducation, Medication administration, Evaluate responses to treatment, Monitor vital signs and CBGs as ordered, Perform/monitor CIWA, COWS, AIMS and Fall Risk screenings as ordered, Perform wound care treatments as ordered.  Evaluation of Outcomes: Progressing  LCSW Treatment Plan for Primary Diagnosis: Major depression Long Term Goal(s): Safe transition to appropriate next level of care at discharge, Engage patient in therapeutic group addressing interpersonal concerns. Short Term Goals: Engage patient in aftercare planning with referrals and resources, Increase emotional regulation, Identify triggers associated with mental health/substance abuse issues and Increase skills for wellness and recovery  Therapeutic Interventions: Assess for all discharge needs, 1 to 1 time with Social worker, Explore available resources and support systems, Assess for adequacy in community support network, Educate family and significant other(s) on suicide prevention, Complete Psychosocial Assessment, Interpersonal group  therapy.  Evaluation of Outcomes: Progressing  Progress in Treatment: Attending groups: Yes Participating in groups: Yes Taking medication as prescribed: Yes, MD continues to assess for medication changes as needed Toleration medication: Yes, no side effects reported at this time Family/Significant other contact made: Yes, pt's husband contacted. Patient understands diagnosis: Developing insight Discussing patient identified problems/goals with staff: Yes Medical problems stabilized or resolved: Yes Denies suicidal/homicidal ideation: Yes Issues/concerns per patient self-inventory: None Other: N/A  New problem(s) identified: None identified at this time.   New Short Term/Long Term Goal(s): None identified at this time.   Discharge Plan or Barriers: Pt will return home and follow up with an outpatient provider.  Reason for Continuation of Hospitalization:  Anxiety  Depression Medication stabilization Suicidal ideation  Estimated Length of Stay: 1-3 days; Estimated discharge date 07/24/17  Attendees: Patient: 07/21/2017 10:23 AM  Physician: Dr. Parke Poisson 07/21/2017 10:23 AM  Nursing: Barrie Folk, RN 07/21/2017 10:23 AM  RN Care Manager: Lars Pinks, RN 07/21/2017 10:23 AM  Social Worker: Matthew Saras, [place] 07/21/2017 10:23 AM  Recreational Therapist:  07/21/2017 10:23 AM  Other: Lindell Spar, NP 07/21/2017 10:23 AM  Other:  07/21/2017 10:23 AM  Other: 07/21/2017 10:23 AM  Scribe for Treatment Team: Georga Kaufmann, MSW,LCSWA 07/21/2017 10:23 AM

## 2017-07-22 NOTE — Plan of Care (Signed)
Problem: Education: Goal: Emotional status will improve Outcome: Progressing Patient reports an improvement in her depressive symptoms; she denies any thoughts of self harm.

## 2017-07-22 NOTE — Progress Notes (Signed)
D: Patient reports some improvement in her depression since admission.  She rates her depression and anxiety as a 3; denies any anxiety.  Patient's goal today is to "stay awake all day and sleep through the night."  She reports fair sleep and appetite; low energy level and poor concentration.  Patient is up in the milieu interacting with her peers.  Her mood is pleasant; her affect is appropriate and bright at times.  Patient's husband gave NP a letter regarding his concern about her anger issues.  She denies any thoughts of self harm. A: Continue to monitor medication management and MD orders.  Safety checks completed every 15 minutes per protocol.  Offer support and encouragement as needed. R: Patient is receptive to staff; her behavior is appropriate.

## 2017-07-22 NOTE — Progress Notes (Signed)
Pt attended evening wrap up group and stated her day was an 8 and this has been her best day thus far. Pt also stated that she had a breakthrough this evening with her parents, holding a secret for 25 years has effected her throughout her life but today was different.

## 2017-07-22 NOTE — [facility] Group Notes (Signed)
LCSW Group Therapy Note  07/22/2017     10:00-11:00AM  Type of Therapy and Topic:  Group Therapy:  Decisional Balance/Substance Use  Participation Level:  Active        . Description of Group:  The main focus of today's process group was learning how to use a decisional balance exercise to make a decision about whether to change an unhealthy coping skill, as well as how to use the information gathered in the actual process of planning that change.  Patients listed some of their most frequently utilized unhealthy coping techniques and CSW pointed out the similarities.  Motivational Interviewing and the whiteboard were utilized to help patients explore in-depth the perceived benefits and costs of a specific, shared unhealthy coping technique (drinking & drugging) as well as the benefits and costs of replacing that with other, healthy coping skills.  A handout was distributed for patients to be able to do this exercise for themselves.     Therapeutic Goals 1. Patient will be able to utilize the decision balance exercise on their own 2. Patient will list coping skills they use to fulfill their needs 3. Patient will identify the differences between healthy and  unhealthy coping skills 4. Patient will verbalize the costs and benefits of drinking/drugging versus making the choice to change 5. Patient will learn how to use the exercise to identify the most important supports to put in place so that they can succeed in a change to which they commit  Summary of Patient Progress: During group, patient expressed that one unhealthy coping skill she uses is staying in bed too much.  She participated fully and seemed insightful.   Therapeutic Modalities Cognitive Behavioral Therapy Motivational Interviewing   Maretta Los, LCSW

## 2017-07-22 NOTE — Progress Notes (Addendum)
D: Pt observed in the dayroom not interacting. Pt at the time of assessment continue to endorse moderate depression and anxiety; "I feel worse today because I had a lot in my mind; during group they told us to think about what got [place] here and ways we can cope better." Pt remained calm and cooperative. Pt denied SI, HI, pain or AVH A: Medications offered as prescribed. Pt given the opportunity to ask questions and state concerns. Support, encouragement, and safe environment provided. R: Pt was med compliant. All patient's questions and concerns addressed. 15-minute safety checks continue. Safety checks continue. Pt did attend wrap-up group.

## 2017-07-22 NOTE — Progress Notes (Signed)
[facility] MD Progress Note  07/22/2017 11:20 AM Shannon Kemp  MRN:  [id number]   Subjective:  Patient reports that she feels better today. She rates her depression at 3/10 and anxiety at 3/10. She denies any SI/HI/AVh and contracts for safety. She reports having no major complaints today and is glad she has been going to the dayroom because she has met some nice people to talk to.   Objective: Patient's chart and findings reviewed and discussed with treatment team. Patient is pleasant and cooperative. Patient's husband dropped off a letter detailing issues at home but has stated that he has seen improvement during her admission. Letter placed in chart. Patient has been seen attending group and participating. Patient has been interacting with staff and others appropriately.  Principal Problem: Major depression Diagnosis:   Patient Active Problem List   Diagnosis Date Noted  . Major depression [F32.9] 07/19/2017  . Bipolar disorder ([place]) [F31.9] 07/18/2017   Total Time spent with patient: 15 minutes  Past Psychiatric History: See H&P  Past Medical History:  Past Medical History:  Diagnosis Date  . Allergy   . Breast pain   . Bruises easily   . Fatigue   . Generalized headaches   . Nipple discharge   . Sleep difficulties   . Weight increase     Past Surgical History:  Procedure Laterality Date  . LAPAROSCOPY  04/01/2012   Procedure: LAPAROSCOPY OPERATIVE;  Surgeon: Claiborne Billings A. Pamala Hurry, MD;  Location: [facility];  Service: Gynecology;  Laterality: N/A;  Operative laparoscopy/right salpingectomy and removal of ectopic pregnancy  . LAPAROSCOPY Left 05/09/2013   Procedure: LAPAROSCOPY OPERATIVE/ECTOPIC;  Surgeon: Linda Hedges, DO;  Location: [facility];  Service: Gynecology;  Laterality: Left;  WITH LEFT SALPINGECTOMY  . ORIF ULNAR FRACTURE    . PLEURAL SCARIFICATION     Family History:  Family History  Problem Relation Age of Onset  . Hypertension Mother   . Hypertension Father    Family  Psychiatric  History: See H&P Social History:  History  Alcohol Use No     History  Drug Use No    Social History   Social History  . Marital status: Married    Spouse name: N/A  . Number of children: N/A  . Years of education: N/A   Social History Main Topics  . Smoking status: Former Research scientist (life sciences)  . Smokeless tobacco: Never Used  . Alcohol use No  . Drug use: No  . Sexual activity: Not Currently   Other Topics Concern  . None   Social History Narrative  . None   Additional Social History:                         Sleep: Good  Appetite:  Good  Current Medications: Current Facility-Administered Medications  Medication Dose Route Frequency Provider Last Rate Last Dose  . acetaminophen (TYLENOL) tablet 650 mg  650 mg Oral Q6H PRN Ethelene Hal, NP   650 mg at 07/18/17 2251  . alum & mag hydroxide-simeth (MAALOX/MYLANTA) 200-200-20 MG/5ML suspension 30 mL  30 mL Oral Q4H PRN Ethelene Hal, NP      . hydrOXYzine (ATARAX/VISTARIL) tablet 50 mg  50 mg Oral TID Kymari Lollis, Lowry Ram, FNP   50 mg at 07/22/17 0815  . magnesium hydroxide (MILK OF MAGNESIA) suspension 30 mL  30 mL Oral Daily PRN Ethelene Hal, NP      . mirtazapine (REMERON) tablet 7.5 mg  7.5  mg Oral QHS Cobos, Myer Peer, MD   7.5 mg at 07/21/17 2102  . venlafaxine XR (EFFEXOR-XR) 24 hr capsule 75 mg  75 mg Oral Daily Cobos, Myer Peer, MD   75 mg at 07/22/17 0815    Lab Results: No results found for this or any previous visit (from the past 48 hour(s)).  Blood Alcohol level:  Lab Results  Component Value Date   ETH <10 68/12/2120    Metabolic Disorder Labs: No results found for: HGBA1C, MPG No results found for: PROLACTIN No results found for: CHOL, TRIG, HDL, CHOLHDL, VLDL, LDLCALC  Physical Findings: AIMS: Facial and Oral Movements Muscles of Facial Expression: None, normal Lips and Perioral Area: None, normal Jaw: None, normal Tongue: None, normal,Extremity  Movements Upper (arms, wrists, hands, fingers): None, normal Lower (legs, knees, ankles, toes): None, normal, Trunk Movements Neck, shoulders, hips: None, normal, Overall Severity Severity of abnormal movements (highest score from questions above): None, normal Incapacitation due to abnormal movements: None, normal Patient's awareness of abnormal movements (rate only patient's report): No Awareness, Dental Status Current problems with teeth and/or dentures?: No Does patient usually wear dentures?: No  CIWA:    COWS:     Musculoskeletal: Strength & Muscle Tone: within normal limits Gait & Station: normal Patient leans: N/A  Psychiatric Specialty Exam: Physical Exam  Nursing note and vitals reviewed. Constitutional: She is oriented to person, place, and time. She appears well-developed and well-nourished.  Cardiovascular: Normal rate.   Respiratory: Effort normal.  Musculoskeletal: Normal range of motion.  Neurological: She is alert and oriented to person, place, and time.  Skin: Skin is warm.    Review of Systems  Constitutional: Negative.   HENT: Negative.   Eyes: Negative.   Respiratory: Negative.   Cardiovascular: Negative.   Gastrointestinal: Negative.   Genitourinary: Negative.   Musculoskeletal: Negative.   Skin: Negative.   Neurological: Negative.   Endo/Heme/Allergies: Negative.   Psychiatric/Behavioral: Positive for depression. Negative for hallucinations and suicidal ideas. The patient is nervous/anxious.     Blood pressure (!) 108/91, pulse 96, temperature 99.1 F (37.3 C), temperature source Oral, resp. rate 18, height 5' 5.5" (1.664 m), weight 66.7 kg (147 lb), last menstrual period 07/11/2017, SpO2 98 %, unknown if currently breastfeeding.Body mass index is 24.09 kg/m.  General Appearance: Casual  Eye Contact:  Good  Speech:  Clear and Coherent and Normal Rate  Volume:  Normal  Mood:  Depressed  Affect:  Flat  Thought Process:  Goal Directed and  Descriptions of Associations: Intact  Orientation:  Full (Time, Place, and Person)  Thought Content:  WDL  Suicidal Thoughts:  No  Homicidal Thoughts:  No  Memory:  Immediate;   Good Recent;   Good Remote;   Good  Judgement:  Good  Insight:  Good  Psychomotor Activity:  Normal  Concentration:  Concentration: Good and Attention Span: Good  Recall:  Good  Fund of Knowledge:  Good  Language:  Good  Akathisia:  No  Handed:  Right  AIMS (if indicated):     Assets:  Communication Skills Desire for Improvement Financial Resources/Insurance Housing Physical Health Social Support Transportation  ADL's:  Intact  Cognition:  WNL  Sleep:  Number of Hours: 6.75   Problems Addressed: MDD Bipolar D/O  Treatment Plan Summary: Daily contact with patient to assess and evaluate symptoms and progress in treatment, Medication management and Plan is to:  -Continue Effexor-XR 75 mg PO Daily for mood stability -Continue Mirtazapine 7.5 mg PO QHS  for mood stability -Continue Vistaril 50 mg PO TID PRN for anxiety -Encourage group therapy participation  Lewis Shock, FNP 07/22/2017, 11:20 AM

## 2017-07-23 NOTE — Progress Notes (Signed)
Pt attended evening wrap up group and stated that her day was a 9, she will be discharged tomorrow so speaking to her dad on the phone before her leaving here helped her progress.

## 2017-07-23 NOTE — [facility] Group Notes (Signed)
[facility] LCSW Group Therapy Note  Date/Time:  07/23/2017 10:00-11:00AM  Type of Therapy and Topic:  Group Therapy:  Healthy and Unhealthy Supports  Participation Level:  Active   Description of Group:  Patients in this group were introduced to the idea of adding a variety of healthy supports to address the various needs in their lives. The picture on the front of Sunday's workbook was used to demonstrate why more supports are needed in every patient's life.  Patients identified and described healthy supports versus unhealthy supports in general, then gave examples of each in their own lives.   They discussed what additional healthy supports could be helpful in their recovery and wellness after discharge in order to prevent future hospitalizations.   An emphasis was placed on using counselor, doctor, therapy groups, 12-step groups, and problem-specific support groups to expand supports.  They also worked as a group on developing a specific plan for several patients to deal with unhealthy supports through [place], psychoeducation with loved ones, and even termination of relationships.   Therapeutic Goals:   1)  discuss importance of adding supports to stay well once out of the hospital  2)  compare healthy versus unhealthy supports and identify some examples of each  3)  generate ideas and descriptions of healthy supports that can be added  4)  offer mutual support about how to address unhealthy supports  5)  encourage active participation in and adherence to discharge plan    Summary of Patient Progress:  The patient shared that the current unhealthy supports available in her life are herself and some of her friends that she actually talks to a lot, while the current healthy supports are her husband, family, and daughter.  The patient expressed a willingness to add therapy and boundaries to help in her recovery journey.   Therapeutic Modalities:   Motivational Interviewing Brief  Solution-Focused Therapy  Selmer Dominion, LCSW 07/23/2017, 11:00AM

## 2017-07-23 NOTE — Progress Notes (Signed)
Shannon Kemp had been up and visible in milieu this evening, did attend and participate in group activity. She was seen interacting appropriately with peers in milieu, she spoke of her day and spoke about feeling better today, she did endorse having some problems with sleep but reports improvement. Kimberlin was able to receive all bedtime medications without incident and did not verbalize any complaints of pain. A. Support and encouragement provided. R. Safety maintained, will continue to monitor.

## 2017-07-23 NOTE — Progress Notes (Signed)
D. Pt reports being somewhat relieved that she isn't going home today stating that she "didn't want to be alone"- not because she would hurt herself, but because she didn't want to be with "her thoughts".  Pt reported earlier that she felt good because she had had a "breakthrough" with her parents. Pt currently denies SI/HI and AVH and agrees to contact staff before acting on any harmful thoughts. Per pt's self inventory, she rates her depression and anxiety a 2/10 and hopelessness a 0/10.  A. Labs and vitals monitored. Pt compliant with medications. Pt supported emotionally and encouraged to express concerns and ask questions.   R. Pt remains safe with 15 minute checks. Will continue POC.

## 2017-07-23 NOTE — Progress Notes (Signed)
[facility] MD Progress Note  07/23/2017 10:27 AM Shannon Kemp  MRN:  [id number]   Subjective:  Patient reports that she had a bad dream last night about telling her parents everything that has been going on in her life and her previous suicide attempts. She told them yesterday and after the dream she woke up and had thoughts of cutting her wrist. She states that the thought passed quickly and she does not want to hurt herself and denies any SI/HI/AVH. She blames it on the dream and would like to stay and be more prepared for a later discharge.    Objective: Patient's chart and findings reviewed and discussed with treatment team. Patient is cooperative and appears anxious on approach. She felt she needed to tell us what happened to be able to help her. Encouraged patient to continue discussing issues with [place] and at group. She has been attending group and interacting. Will discuss discharge at a later date.   Principal Problem: Major depression Diagnosis:   Patient Active Problem List   Diagnosis Date Noted  . Major depression [F32.9] 07/19/2017  . Bipolar disorder ([place]) [F31.9] 07/18/2017   Total Time spent with patient: 15 minutes  Past Psychiatric History: See H&P  Past Medical History:  Past Medical History:  Diagnosis Date  . Allergy   . Breast pain   . Bruises easily   . Fatigue   . Generalized headaches   . Nipple discharge   . Sleep difficulties   . Weight increase     Past Surgical History:  Procedure Laterality Date  . LAPAROSCOPY  04/01/2012   Procedure: LAPAROSCOPY OPERATIVE;  Surgeon: Claiborne Billings A. Pamala Hurry, MD;  Location: [facility];  Service: Gynecology;  Laterality: N/A;  Operative laparoscopy/right salpingectomy and removal of ectopic pregnancy  . LAPAROSCOPY Left 05/09/2013   Procedure: LAPAROSCOPY OPERATIVE/ECTOPIC;  Surgeon: Linda Hedges, DO;  Location: [facility];  Service: Gynecology;  Laterality: Left;  WITH LEFT SALPINGECTOMY  . ORIF ULNAR FRACTURE    . PLEURAL  SCARIFICATION     Family History:  Family History  Problem Relation Age of Onset  . Hypertension Mother   . Hypertension Father    Family Psychiatric  History: See H&P Social History:  History  Alcohol Use No     History  Drug Use No    Social History   Social History  . Marital status: Married    Spouse name: N/A  . Number of children: N/A  . Years of education: N/A   Social History Main Topics  . Smoking status: Former Research scientist (life sciences)  . Smokeless tobacco: Never Used  . Alcohol use No  . Drug use: No  . Sexual activity: Not Currently   Other Topics Concern  . None   Social History Narrative  . None   Additional Social History:        Sleep: Good  Appetite:  Good  Current Medications: Current Facility-Administered Medications  Medication Dose Route Frequency Provider Last Rate Last Dose  . acetaminophen (TYLENOL) tablet 650 mg  650 mg Oral Q6H PRN Ethelene Hal, NP   650 mg at 07/18/17 2251  . alum & mag hydroxide-simeth (MAALOX/MYLANTA) 200-200-20 MG/5ML suspension 30 mL  30 mL Oral Q4H PRN Ethelene Hal, NP      . hydrOXYzine (ATARAX/VISTARIL) tablet 50 mg  50 mg Oral TID Mohamud Mrozek, Lowry Ram, FNP   50 mg at 07/23/17 0800  . magnesium hydroxide (MILK OF MAGNESIA) suspension 30 mL  30 mL Oral Daily  PRN Ethelene Hal, NP      . mirtazapine (REMERON) tablet 7.5 mg  7.5 mg Oral QHS Cobos, Myer Peer, MD   7.5 mg at 07/22/17 2138  . venlafaxine XR (EFFEXOR-XR) 24 hr capsule 75 mg  75 mg Oral Daily Cobos, Myer Peer, MD   75 mg at 07/23/17 0800    Lab Results: No results found for this or any previous visit (from the past 48 hour(s)).  Blood Alcohol level:  Lab Results  Component Value Date   ETH <10 23/76/2831    Metabolic Disorder Labs: No results found for: HGBA1C, MPG No results found for: PROLACTIN No results found for: CHOL, TRIG, HDL, CHOLHDL, VLDL, LDLCALC  Physical Findings: AIMS: Facial and Oral Movements Muscles of Facial  Expression: None, normal Lips and Perioral Area: None, normal Jaw: None, normal Tongue: None, normal,Extremity Movements Upper (arms, wrists, hands, fingers): None, normal Lower (legs, knees, ankles, toes): None, normal, Trunk Movements Neck, shoulders, hips: None, normal, Overall Severity Severity of abnormal movements (highest score from questions above): None, normal Incapacitation due to abnormal movements: None, normal Patient's awareness of abnormal movements (rate only patient's report): No Awareness, Dental Status Current problems with teeth and/or dentures?: No Does patient usually wear dentures?: No  CIWA:    COWS:     Musculoskeletal: Strength & Muscle Tone: within normal limits Gait & Station: normal Patient leans: N/A  Psychiatric Specialty Exam: Physical Exam  Nursing note and vitals reviewed. Constitutional: She is oriented to person, place, and time. She appears well-developed and well-nourished.  Cardiovascular: Normal rate.   Respiratory: Effort normal.  Musculoskeletal: Normal range of motion.  Neurological: She is alert and oriented to person, place, and time.  Skin: Skin is warm.    Review of Systems  Constitutional: Negative.   HENT: Negative.   Eyes: Negative.   Respiratory: Negative.   Cardiovascular: Negative.   Gastrointestinal: Negative.   Genitourinary: Negative.   Musculoskeletal: Negative.   Skin: Negative.   Neurological: Negative.   Endo/Heme/Allergies: Negative.   Psychiatric/Behavioral: Positive for depression. Negative for hallucinations and suicidal ideas. The patient is nervous/anxious.     Blood pressure (!) 113/92, pulse (!) 112, temperature 98.2 F (36.8 C), temperature source Oral, resp. rate 18, height 5' 5.5" (1.664 m), weight 66.7 kg (147 lb), last menstrual period 07/11/2017, SpO2 98 %, unknown if currently breastfeeding.Body mass index is 24.09 kg/m.  General Appearance: Casual  Eye Contact:  Good  Speech:  Clear and  Coherent and Normal Rate  Volume:  Normal  Mood:  Anxious and Depressed  Affect:  Flat  Thought Process:  Goal Directed and Descriptions of Associations: Intact  Orientation:  Full (Time, Place, and Person)  Thought Content:  WDL  Suicidal Thoughts:  No  Homicidal Thoughts:  No  Memory:  Immediate;   Good Recent;   Good Remote;   Good  Judgement:  Good  Insight:  Good  Psychomotor Activity:  Normal  Concentration:  Concentration: Good and Attention Span: Good  Recall:  Good  Fund of Knowledge:  Good  Language:  Good  Akathisia:  No  Handed:  Right  AIMS (if indicated):     Assets:  Communication Skills Desire for Improvement Financial Resources/Insurance Housing Physical Health Social Support Transportation  ADL's:  Intact  Cognition:  WNL  Sleep:  Number of Hours: 6.75   Problems Addressed: MDD Bipolar D/O  Treatment Plan Summary: Daily contact with patient to assess and evaluate symptoms and progress in treatment, Medication  management and Plan is to:  -Continue Effexor-XR 75 mg PO Daily for mood stability -Continue Mirtazapine 7.5 mg PO QHS for mood stability -Continue Vistaril 50 mg PO TID PRN for anxiety -Encourage group therapy participation  Lewis Shock, FNP 07/23/2017, 10:27 AM

## 2017-07-24 MED ORDER — MIRTAZAPINE 7.5 MG PO TABS: 7.5000 mg | ORAL_TABLET | Freq: Every day | ORAL | 0 refills | Status: DC

## 2017-07-24 MED ORDER — VENLAFAXINE HCL ER 75 MG PO CP24: 75.0000 mg | ORAL_CAPSULE | Freq: Every day | ORAL | 0 refills | Status: DC

## 2017-07-24 MED ORDER — HYDROXYZINE HCL 50 MG PO TABS: 50.0000 mg | ORAL_TABLET | Freq: Three times a day (TID) | ORAL | 0 refills | Status: DC

## 2017-07-24 NOTE — Progress Notes (Signed)
Shannon Kemp had been up and visible in milieu this evening, attended and participated in group activity. She spoke about having a bit of a meltdown earlier in the day and spoke about how it was probable a good thing that she did not discharge today. She spoke about feeling better as the day progressed and spoke about how she feels ready for discharge in the morning. She denied any SI and was able to receive medication without incident. A. Support and encouragement provided. R. Safety maintained, will continue to monitor.

## 2017-07-24 NOTE — Discharge Summary (Signed)
Physician Discharge Summary Note  Patient:  Shannon Kemp is an 41 y.o., female MRN:  [id number] DOB:  [birth year] Patient phone:  [phone] (home)  Patient address:   [address] [place] [address],  Total Time spent with patient: 20 minutes  Date of Admission:  07/18/2017 Date of Discharge:07/24/17  Reason for Admission:  Worsening depression with SI  Principal Problem: Major depression Discharge Diagnoses: Patient Active Problem List   Diagnosis Date Noted  . Major depression [F32.9] 07/19/2017  . Bipolar disorder ([place]) [F31.9] 07/18/2017    Past Psychiatric History: Denies any diagnosis 1 previous suicide attempt at 41 years old by overdose  Past Medical History:  Past Medical History:  Diagnosis Date  . Allergy   . Breast pain   . Bruises easily   . Fatigue   . Generalized headaches   . Nipple discharge   . Sleep difficulties   . Weight increase     Past Surgical History:  Procedure Laterality Date  . LAPAROSCOPY  04/01/2012   Procedure: LAPAROSCOPY OPERATIVE;  Surgeon: Claiborne Billings A. Pamala Hurry, MD;  Location: [facility];  Service: Gynecology;  Laterality: N/A;  Operative laparoscopy/right salpingectomy and removal of ectopic pregnancy  . LAPAROSCOPY Left 05/09/2013   Procedure: LAPAROSCOPY OPERATIVE/ECTOPIC;  Surgeon: Linda Hedges, DO;  Location: [facility];  Service: Gynecology;  Laterality: Left;  WITH LEFT SALPINGECTOMY  . ORIF ULNAR FRACTURE    . PLEURAL SCARIFICATION     Family History:  Family History  Problem Relation Age of Onset  . Hypertension Mother   . Hypertension Father    Family Psychiatric  History: Cousin- died from suicide Social History:  History  Alcohol Use No     History  Drug Use No    Social History   Social History  . Marital status: Married    Spouse name: N/A  . Number of children: N/A  . Years of education: N/A   Social History Main Topics  . Smoking status: Former Research scientist (life sciences)  . Smokeless tobacco: Never Used  . Alcohol use  No  . Drug use: No  . Sexual activity: Not Currently   Other Topics Concern  . None   Social History Narrative  . None    Hospital Course:   ED [facility] Assessment: 41 y.o.female. She presents to [facility], voluntarily. Patient presents to [facility] after attempting suicide. She made gestures to cut her left wrist. Prior to the gesture of cutting her wrist she texted and called her spouse. Sts her spouse came home in time and removed the knife. Patient has visible red marks on her left wrist (superficial). She admits to suicidal ideations started this morning while she was in shower. The trigger for her stress is related to the following: daughter turning 22 years old and moving out of the home recently, daughter moved 3 hours away, and got married. Also, patient's grandmother in law recently passed away (2 weeks ago). States that her family hasn't had time to grieve. She has no history of prior suicide attempts.  She denies HI. She is calm and cooperative. She reports a history of temper issues but tries to control it by coping mechanisms. She denies legal issues. No AVH's. Patient denies auditory and visual hallucinations. No history of alcohol or drug use. No history of INPT hospitalizations. No outpatient therapist or psychiatrist. Appetite is fair. She sleeps appropriately (6-8 hrs per night). Patient is dressed in scrubs. She is alert and oriented to time, person, place, and situation.  07/19/17: Patient  is seen today and is cooperative and appears depressed with flat affect. She reports the same information as above. She reports that she has never sought treatment for her depression even though she has dealt with it since she was 41 y.o.when she attempted to overdose. She has never tried to attempt suicide again until now. She has never been on medications or diagnosed. She reports being employed at [employer] but got working enough hours. She lost her job after 10 years at [employer] due to them closing. She  reports numerous medical issues in the past that have caused her to feel depressed as well. She reports anxiety on a daily basis and rated her depression at 5/10 and anxiety 6/10. She admits passive SI, but denies any current HI/AVH. She reports husband as biggest support system. She shows some Borderline traits with waiting till her husband drove home from work and find her holding the knife to her wrist with red marks present. Her symptoms include depressed mood, loss of joy in normal activities, insomnia, guilt, elevated mood for 2-3 days, excessive spending and "getting in trouble" with her husband, racing thoughts, difficulty concentrating. She also reports getting agitated easily at her family and others and sometimes lashing out.  Patient has remained on the [facility] unit for 5 days and stabilized with medication and therapy. She was started on Effexor-XR and titrated to 75 mg PO Daily and Remeron 7.5 mg PO QHS . She was also using Vistaril 25 mg PRN. Patient has been attending group and participating. She has been seen in the day room interacting with peers and staff appropriately. She states that she has her depression and agitation controlled better now and has discussed her issues with her husband who has agreed to take a couple of days off work to help her at home. She is provided with prescriptions for her medications upon discharge. She denies any SI/HI/AVh and contracts for safety. She agrees to follow up with her outpatient providers and be compliant with her medications.   Physical Findings: AIMS: Facial and Oral Movements Muscles of Facial Expression: None, normal Lips and Perioral Area: None, normal Jaw: None, normal Tongue: None, normal,Extremity Movements Upper (arms, wrists, hands, fingers): None, normal Lower (legs, knees, ankles, toes): None, normal, Trunk Movements Neck, shoulders, hips: None, normal, Overall Severity Severity of abnormal movements (highest score from questions  above): None, normal Incapacitation due to abnormal movements: None, normal Patient's awareness of abnormal movements (rate only patient's report): No Awareness, Dental Status Current problems with teeth and/or dentures?: No Does patient usually wear dentures?: No  CIWA:    COWS:     Musculoskeletal: Strength & Muscle Tone: within normal limits Gait & Station: normal Patient leans: N/A  Psychiatric Specialty Exam: Physical Exam  Nursing note and vitals reviewed. Constitutional: She is oriented to person, place, and time. She appears well-developed and well-nourished.  Respiratory: Effort normal.  Musculoskeletal: Normal range of motion.  Neurological: She is alert and oriented to person, place, and time.  Skin: Skin is warm.    Review of Systems  Constitutional: Negative.   HENT: Negative.   Eyes: Negative.   Respiratory: Negative.   Cardiovascular: Negative.   Gastrointestinal: Negative.   Genitourinary: Negative.   Musculoskeletal: Negative.   Skin: Negative.   Neurological: Negative.   Endo/Heme/Allergies: Negative.   Psychiatric/Behavioral: Negative.     Blood pressure (!) 118/94, pulse (!) 109, temperature 99.3 F (37.4 C), temperature source Oral, resp. rate 16, height 5' 5.5" (1.664 m), weight  66.7 kg (147 lb), last menstrual period 07/11/2017, SpO2 98 %, unknown if currently breastfeeding.Body mass index is 24.09 kg/m.  General Appearance: Casual  Eye Contact:  Good  Speech:  Clear and Coherent and Normal Rate  Volume:  Normal  Mood:  Euthymic  Affect:  Appropriate  Thought Process:  Coherent and Descriptions of Associations: Intact  Orientation:  Full (Time, Place, and Person)  Thought Content:  WDL  Suicidal Thoughts:  No  Homicidal Thoughts:  No  Memory:  Immediate;   Good Recent;   Good Remote;   Good  Judgement:  Good  Insight:  Good  Psychomotor Activity:  Normal  Concentration:  Concentration: Good and Attention Span: Good  Recall:  Good  Fund  of Knowledge:  Good  Language:  Good  Akathisia:  No  Handed:  Right  AIMS (if indicated):     Assets:  Communication Skills Desire for Improvement Financial Resources/Insurance Housing Physical Health Social Support Transportation  ADL's:  Intact  Cognition:  WNL  Sleep:  Number of Hours: 6.75     Have you used any form of tobacco in the last 30 days? (Cigarettes, Smokeless Tobacco, Cigars, and/or Pipes): No  Has this patient used any form of tobacco in the last 30 days? (Cigarettes, Smokeless Tobacco, Cigars, and/or Pipes) Yes, No  Blood Alcohol level:  Lab Results  Component Value Date   ETH <10 16/07/9603    Metabolic Disorder Labs:  No results found for: HGBA1C, MPG No results found for: PROLACTIN No results found for: CHOL, TRIG, HDL, CHOLHDL, VLDL, LDLCALC  See Psychiatric Specialty Exam and Suicide Risk Assessment completed by Attending Physician prior to discharge.  Discharge destination:  Home  Is patient on multiple antipsychotic therapies at discharge:  No   Has Patient had three or more failed trials of antipsychotic monotherapy by history:  No  Recommended Plan for Multiple Antipsychotic Therapies: NA   Allergies as of 07/24/2017      Reactions   Morphine And Related Itching, Other (See Comments)   hallucinations   Adhesive [tape] Rash   Latex Rash      Medication List    STOP taking these medications   acetaminophen 500 MG tablet Commonly known as:  TYLENOL   hydroxypropyl methylcellulose / hypromellose 2.5 % ophthalmic solution Commonly known as:  ISOPTO TEARS / GONIOVISC   ibuprofen 600 MG tablet Commonly known as:  ADVIL   multivitamin with minerals Tabs tablet   naproxen 500 MG tablet Commonly known as:  NAPROSYN   ondansetron 4 MG disintegrating tablet Commonly known as:  ZOFRAN ODT     TAKE these medications     Indication  hydrOXYzine 50 MG tablet Commonly known as:  ATARAX/VISTARIL Take 1 tablet (50 mg total) by mouth  3 (three) times daily. For anxiety  Indication:  Feeling Anxious   mirtazapine 7.5 MG tablet Commonly known as:  REMERON Take 1 tablet (7.5 mg total) by mouth at bedtime. For mood control and sleep  Indication:  mood stability   venlafaxine XR 75 MG 24 hr capsule Commonly known as:  EFFEXOR-XR Take 1 capsule (75 mg total) by mouth daily. For mood control  Indication:  mood stability      Follow-up Information    [facility] Follow up on 07/27/2017.   Why:  Medication management appointment 10/25 at 1:20pm with NP Shirlean Schlein to fill the gap until your new psychiatry appt. Please call the office if you need  to cancel or reschedule your appointment. Thank you. Contact information: [place]  [address] [place] , [place] [address] Phone: [phone] Fax: [facility] Follow up on 09/20/2017.   Specialty:  Behavioral Health Why:  Medication managment appointment 12/19 at [facility] with Dr. Adele Schilder. Contact information: [place] [phone]       [facility] Follow up on 07/26/2017.   Why:  at 2:00pm for your initial therapy appointment with Mrs. Tye Savoy. Please call if you need to be rescheduled. Mrs. Tye Savoy is at extension 1 Contact information: [address]  [address] ([place]) [place], [place] [phone] Fax: [phone]          Follow-up recommendations:  Continue activity as tolerated. Continue diet as recommended by your PCP. Ensure to keep all appointments with outpatient providers.  Comments:  Patient is instructed prior to discharge to: Take all medications as prescribed by his/her mental healthcare provider. Report any adverse effects and or reactions from the medicines to his/her  outpatient provider promptly. Patient has been instructed & cautioned: To not engage in alcohol and or illegal drug use while on prescription medicines. In the event of worsening symptoms, patient is instructed to call the crisis hotline, 911 and or go to the nearest ED for appropriate evaluation and treatment of symptoms. To follow-up with his/her primary care provider for your other medical issues, concerns and or health care needs.    Signed: Lowry Ram Money, FNP 07/24/2017, 11:16 AM   Patient seen, Suicide Assessment Completed.  Disposition Plan Reviewed

## 2017-07-24 NOTE — Progress Notes (Addendum)
Pt discharged home with her husband. Pt was ambulatory, stable and appreciative at that time. All papers and prescriptions were given and valuables returned. Verbal understanding expressed. Denies SI/HI and A/VH. Pt given opportunity to express concerns and ask questions.  

## 2017-07-24 NOTE — [facility] Suicide Risk Assessment (Signed)
[facility] Discharge Suicide Risk Assessment   Principal Problem: Major depression Discharge Diagnoses:  Patient Active Problem List   Diagnosis Date Noted  . Major depression [F32.9] 07/19/2017  . Bipolar disorder ([place]) [F31.9] 07/18/2017    Total Time spent with patient: 30 minutes  Musculoskeletal: Strength & Muscle Tone: within normal limits Gait & Station: normal Patient leans: N/A  Psychiatric Specialty Exam: ROS  Mild headache, no chest pain, no shortness of breath, no nausea or vomiting ,no rash   Blood pressure (!) 118/94, pulse (!) 109, temperature 99.3 F (37.4 C), temperature source Oral, resp. rate 16, height 5' 5.5" (1.664 m), weight 66.7 kg (147 lb), last menstrual period 07/11/2017, SpO2 98 %, unknown if currently breastfeeding.Body mass index is 24.09 kg/m.  General Appearance: Well Groomed  Eye Contact::  Good  Speech:  Normal Rate409  Volume:  Normal  Mood:  reports improved mood, and states " I feel a lot better , happier"  Affect:  Appropriate and Full Range  Thought Process:  Linear and Descriptions of Associations: Intact  Orientation:  Other:  fully alert and attentive   Thought Content:  no hallucinations, no delusions, not internally preoccupied   Suicidal Thoughts:  No denies suicidal or self injurious ideations, no homicidal or violent ideations   Homicidal Thoughts:  No  Memory:  recent and remote grossly intact   Judgement:  Other:  improving   Insight:  improving   Psychomotor Activity:  Normal  Concentration:  Good  Recall:  Good  Fund of Knowledge:Good  Language: Good  Akathisia:  Negative  Handed:  Right  AIMS (if indicated):     Assets:  Communication Skills Desire for Improvement Resilience  Sleep:  Number of Hours: 6.75  Cognition: WNL  ADL's:  Intact   Mental Status Per Nursing Assessment::   On Admission:  Self-harm thoughts  Demographic Factors:  41 year old married female , has two children, employed   Loss  Factors: Husband's grandmother passed away recently, daughter recently got married and moved out   Historical Factors: Suicide attempt at age 21, no prior history of psychiatric admissions   Risk Reduction Factors:   Responsible for children under 33 years of age, Sense of responsibility to family, Employed, Living with another person, especially a relative and Positive coping skills or problem solving skills  Continued Clinical Symptoms:  At this time patient is alert, attentive, well related, calm, reports mood as improved and at this time presents with a full range of affect, no thought disorder, no suicidal or self injurious ideations, no homicidal or violent ideations, no hallucinations, no delusions, not internally preoccupied, future oriented . No disruptive or agitated behaviors on unit  Denies medication side effects- we reviewed side effect profile , and importance of not driving if sedated   Cognitive Features That Contribute To Risk:  No gross cognitive deficits noted upon discharge. Is alert , attentive, and oriented x 3   Suicide Risk:  Mild:  Suicidal ideation of limited frequency, intensity, duration, and specificity.  There are no identifiable plans, no associated intent, mild dysphoria and related symptoms, good self-control (both objective and subjective assessment), few other risk factors, and identifiable protective factors, including available and accessible social support.  Follow-up Information    [facility] Follow up on 07/27/2017.   Why:  Medication management appointment 10/25 at 1:20pm with NP Shirlean Schlein to fill the gap until your new psychiatry appt. Please call the office if you  need to cancel or reschedule your appointment. Thank you. Contact information: [place]  [address] [place] , [place] [address] Phone: [phone] Fax: [facility] Follow up on 09/20/2017.   Specialty:  Behavioral Health Why:  Medication managment appointment 12/19 at [facility] with Dr. Adele Schilder. Contact information: [place] [phone]       [facility] Follow up on 07/26/2017.   Why:  at 2:00pm for your initial therapy appointment with Mrs. Tye Savoy. Please call if you need to be rescheduled. Mrs. Tye Savoy is at extension 1 Contact information: [address]  [address] ([place]) [place], [place] [phone] Fax: [phone]          Plan Of Care/Follow-up recommendations:  Activity:  as tolerated  Diet:  regular Tests:  NA Other:  See below   Patient is expressing readiness for discharge, and is leaving unit in good spirits  Plans to return home Follow up as above   Jenne Campus, MD 07/24/2017, 10:50 AM

## 2017-07-24 NOTE — Progress Notes (Signed)
Recreation Therapy Notes  Date:  07/24/17 Time: 0930 Location: [address] Dayroom  Group Topic: Stress Management  Goal Area(s) Addresses:  Patient will verbalize importance of using healthy stress management.  Patient will identify positive emotions associated with healthy stress management.   Behavioral Response: Engaged  Intervention: Stress Management  Activity :  Meditation.  LRT introduced the stress management technique of meditation.  LRT played a meditation leading patients through a body scan that allowed them to take note of the sensations they may be feeling.  Patients were to follow along as meditation played.  Education:  Stress Management, Discharge Planning.   Education Outcome: Acknowledges edcuation/In group clarification offered/Needs additional education  Clinical Observations/Feedback: Pt attended group.   Victorino Sparrow, LRT/CTRS         Ria Comment, Taevion Sikora A 07/24/2017 11:00 AM

## 2017-07-24 NOTE — Progress Notes (Signed)
  [facility] Adult Case Management Discharge Plan :  Will you be returning to the same living situation after discharge:  Yes,  pt returning home. At discharge, do you have transportation home?: Yes,  pt has access to transportation. Do you have the ability to pay for your medications: Yes,  pt has insurance.  Release of information consent forms completed and in the chart;  Patient's signature needed at discharge.  Patient to Follow up at: Follow-up Information    [facility] Follow up on 07/27/2017.   Why:  Medication management appointment 10/25 at 1:20pm with NP Shirlean Schlein to fill the gap until your new psychiatry appt. Please call the office if you need to cancel or reschedule your appointment. Thank you. Contact information: [place]  [address] [place] , [place] [address] Phone: [phone] Fax: [facility] Follow up on 09/20/2017.   Specialty:  Behavioral Health Why:  Medication managment appointment 12/19 at [facility] with Dr. Adele Schilder. Contact information: [place] [phone]       [facility] Follow up on 07/26/2017.   Why:  at 2:00pm for your initial therapy appointment with Mrs. Tye Savoy. Please call if you need to be rescheduled. Mrs. Tye Savoy is at extension 1 Contact information: [address]  [address] ([place]) [place], [place] [phone] Fax: [phone]          Next level of care provider has access to [place] and Suicide Prevention discussed: Yes,  with pt and with pt's husband.  Have you used any form of tobacco in the last 30 days? (Cigarettes, Smokeless Tobacco, Cigars, and/or Pipes): No  Has patient been referred to the Quitline?: N/A patient is  not a smoker  Patient has been referred for addiction treatment: Parks, MSW, LCSWA 07/24/2017, 10:26 AM

## 2017-08-20 ENCOUNTER — Ambulatory Visit (HOSPITAL_COMMUNITY): Payer: Self-pay | Admitting: Psychiatry

## 2017-09-20 ENCOUNTER — Ambulatory Visit (INDEPENDENT_AMBULATORY_CARE_PROVIDER_SITE_OTHER): Payer: 59 | Admitting: Psychiatry

## 2017-09-20 ENCOUNTER — Encounter (HOSPITAL_COMMUNITY): Payer: Self-pay | Admitting: Psychiatry

## 2017-09-20 VITALS — BP 122/74 | HR 88 | Ht 65.5 in | Wt 163.5 lb

## 2017-09-20 DIAGNOSIS — Z62810 Personal history of physical and sexual abuse in childhood: Secondary | ICD-10-CM

## 2017-09-20 DIAGNOSIS — Z79899 Other long term (current) drug therapy: Secondary | ICD-10-CM | POA: Diagnosis not present

## 2017-09-20 DIAGNOSIS — Z91411 Personal history of adult psychological abuse: Secondary | ICD-10-CM

## 2017-09-20 DIAGNOSIS — Z915 Personal history of self-harm: Secondary | ICD-10-CM | POA: Diagnosis not present

## 2017-09-20 DIAGNOSIS — F411 Generalized anxiety disorder: Secondary | ICD-10-CM | POA: Diagnosis not present

## 2017-09-20 DIAGNOSIS — F319 Bipolar disorder, unspecified: Secondary | ICD-10-CM

## 2017-09-20 MED ORDER — TRAZODONE HCL 100 MG PO TABS: ORAL_TABLET | ORAL | 0 refills | Status: DC

## 2017-09-20 MED ORDER — ARIPIPRAZOLE 5 MG PO TABS: 5.0000 mg | ORAL_TABLET | Freq: Every day | ORAL | 1 refills | Status: DC
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
## 2017-10-26 ENCOUNTER — Encounter (HOSPITAL_COMMUNITY): Payer: Self-pay | Admitting: Psychiatry

## 2017-10-26 ENCOUNTER — Ambulatory Visit (INDEPENDENT_AMBULATORY_CARE_PROVIDER_SITE_OTHER): Payer: 59 | Admitting: Psychiatry

## 2017-10-26 VITALS — BP 120/72 | HR 96 | Ht 65.5 in | Wt 165.0 lb

## 2017-10-26 DIAGNOSIS — Z915 Personal history of self-harm: Secondary | ICD-10-CM | POA: Diagnosis not present

## 2017-10-26 DIAGNOSIS — F411 Generalized anxiety disorder: Secondary | ICD-10-CM | POA: Diagnosis not present

## 2017-10-26 DIAGNOSIS — F319 Bipolar disorder, unspecified: Secondary | ICD-10-CM

## 2017-10-26 DIAGNOSIS — Z87891 Personal history of nicotine dependence: Secondary | ICD-10-CM | POA: Diagnosis not present

## 2017-10-26 DIAGNOSIS — F819 Developmental disorder of scholastic skills, unspecified: Secondary | ICD-10-CM

## 2017-10-26 MED ORDER — HYDROXYZINE HCL 50 MG PO TABS: 50.0000 mg | ORAL_TABLET | Freq: Three times a day (TID) | ORAL | 0 refills | Status: DC

## 2017-10-26 MED ORDER — VENLAFAXINE HCL ER 75 MG PO CP24: 75.0000 mg | ORAL_CAPSULE | Freq: Every day | ORAL | 0 refills | Status: DC

## 2017-10-26 MED ORDER — ARIPIPRAZOLE 5 MG PO TABS: 5.0000 mg | ORAL_TABLET | Freq: Every day | ORAL | 0 refills | Status: DC

## 2017-10-26 MED ORDER — TRAZODONE HCL 50 MG PO TABS: 50.0000 mg | ORAL_TABLET | Freq: Every evening | ORAL | 0 refills | Status: DC | PRN
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
## 2018-01-25 ENCOUNTER — Encounter (HOSPITAL_COMMUNITY): Payer: Self-pay | Admitting: Psychiatry

## 2018-01-25 ENCOUNTER — Ambulatory Visit (INDEPENDENT_AMBULATORY_CARE_PROVIDER_SITE_OTHER): Payer: 59 | Admitting: Psychiatry

## 2018-01-25 DIAGNOSIS — F411 Generalized anxiety disorder: Secondary | ICD-10-CM | POA: Diagnosis not present

## 2018-01-25 DIAGNOSIS — F319 Bipolar disorder, unspecified: Secondary | ICD-10-CM | POA: Diagnosis not present

## 2018-01-25 DIAGNOSIS — Z915 Personal history of self-harm: Secondary | ICD-10-CM | POA: Diagnosis not present

## 2018-01-25 DIAGNOSIS — Z87891 Personal history of nicotine dependence: Secondary | ICD-10-CM | POA: Diagnosis not present

## 2018-01-25 MED ORDER — HYDROXYZINE HCL 50 MG PO TABS: 50.0000 mg | ORAL_TABLET | Freq: Three times a day (TID) | ORAL | 0 refills | Status: DC

## 2018-01-25 MED ORDER — VENLAFAXINE HCL ER 75 MG PO CP24: 75.0000 mg | ORAL_CAPSULE | Freq: Every day | ORAL | 0 refills | Status: DC

## 2018-01-25 MED ORDER — ARIPIPRAZOLE 5 MG PO TABS: 5.0000 mg | ORAL_TABLET | Freq: Every day | ORAL | 0 refills | Status: DC
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
## 2018-04-24 ENCOUNTER — Other Ambulatory Visit (HOSPITAL_COMMUNITY): Payer: Self-pay

## 2018-04-24 DIAGNOSIS — F411 Generalized anxiety disorder: Secondary | ICD-10-CM

## 2018-04-24 DIAGNOSIS — F319 Bipolar disorder, unspecified: Secondary | ICD-10-CM

## 2018-04-24 MED ORDER — HYDROXYZINE HCL 50 MG PO TABS
50.0000 mg | ORAL_TABLET | Freq: Three times a day (TID) | ORAL | 0 refills | Status: DC
Start: 2018-04-24 — End: 2018-10-12

## 2018-04-24 MED ORDER — VENLAFAXINE HCL ER 75 MG PO CP24: 75.0000 mg | ORAL_CAPSULE | Freq: Every day | ORAL | 0 refills | Status: DC

## 2018-04-24 MED ORDER — ARIPIPRAZOLE 5 MG PO TABS: 5.0000 mg | ORAL_TABLET | Freq: Every day | ORAL | 0 refills | Status: DC

## 2018-04-26 ENCOUNTER — Ambulatory Visit (HOSPITAL_COMMUNITY): Payer: Self-pay | Admitting: Psychiatry

## 2018-05-29 ENCOUNTER — Encounter (HOSPITAL_COMMUNITY): Payer: Self-pay

## 2018-05-29 ENCOUNTER — Other Ambulatory Visit: Payer: Self-pay

## 2018-05-29 ENCOUNTER — Emergency Department (HOSPITAL_COMMUNITY): Payer: Managed Care, Other (non HMO)

## 2018-05-29 ENCOUNTER — Emergency Department (HOSPITAL_COMMUNITY)
Admission: EM | Admit: 2018-05-29 | Discharge: 2018-05-29 | Disposition: A | Discharge status: Home / Self Care | Payer: Managed Care, Other (non HMO) | Attending: Emergency Medicine | Admitting: Emergency Medicine

## 2018-05-29 DIAGNOSIS — R51 Headache: Secondary | ICD-10-CM | POA: Diagnosis not present

## 2018-05-29 DIAGNOSIS — Z91040 Latex allergy status: Secondary | ICD-10-CM | POA: Insufficient documentation

## 2018-05-29 DIAGNOSIS — Z87891 Personal history of nicotine dependence: Secondary | ICD-10-CM | POA: Diagnosis not present

## 2018-05-29 DIAGNOSIS — Z79899 Other long term (current) drug therapy: Secondary | ICD-10-CM | POA: Insufficient documentation

## 2018-05-29 DIAGNOSIS — R0789 Other chest pain: Secondary | ICD-10-CM | POA: Insufficient documentation

## 2018-05-29 DIAGNOSIS — R519 Headache, unspecified: Secondary | ICD-10-CM

## 2018-05-29 LAB — BASIC METABOLIC PANEL
ANION GAP: 5 (ref 5–15)
BUN: 11 mg/dL (ref 6–20)
CALCIUM: 8.6 mg/dL — AB (ref 8.9–10.3)
CO2: 25 mmol/L (ref 22–32)
CREATININE: 0.67 mg/dL (ref 0.44–1.00)
Chloride: 110 mmol/L (ref 98–111)
GLUCOSE: 101 mg/dL — AB (ref 70–99)
Potassium: 3.8 mmol/L (ref 3.5–5.1)
Sodium: 140 mmol/L (ref 135–145)

## 2018-05-29 LAB — CBC
HCT: 43 % (ref 36.0–46.0)
HEMOGLOBIN: 14.6 g/dL (ref 12.0–15.0)
MCH: 28.7 pg (ref 26.0–34.0)
MCHC: 34 g/dL (ref 30.0–36.0)
MCV: 84.5 fL (ref 78.0–100.0)
PLATELETS: 284 10*3/uL (ref 150–400)
RBC: 5.09 MIL/uL (ref 3.87–5.11)
RDW: 13.9 % (ref 11.5–15.5)
WBC: 7.7 10*3/uL (ref 4.0–10.5)

## 2018-05-29 LAB — I-STAT TROPONIN, ED: TROPONIN I, POC: 0 ng/mL (ref 0.00–0.08)

## 2018-05-29 MED ORDER — KETOROLAC TROMETHAMINE 30 MG/ML IJ SOLN
30.0000 mg | Freq: Once | INTRAMUSCULAR | Status: AC
  Administered 2018-05-29: 30 mg via INTRAVENOUS
  Filled 2018-05-29: qty 1

## 2018-05-29 MED ORDER — METHOCARBAMOL 500 MG PO TABS: 500.0000 mg | ORAL_TABLET | Freq: Three times a day (TID) | ORAL | 0 refills | Status: DC | PRN

## 2018-05-29 NOTE — Discharge Instructions (Addendum)
Some of the headache and arm pain may be due to muscle spasm in your left neck.  Take the muscle relaxers as needed.

## 2018-05-29 NOTE — ED Triage Notes (Signed)
Patient states she had a headache last week, subsided over the weekend and now the headache is back and radiates down into the left neck, left arm pain, and left chest pressure. Patient states a history of a pneumothorax and states the left chest hurts when she takes a deep breath.

## 2018-05-29 NOTE — ED Provider Notes (Signed)
[facility] DEPT Provider Note   CSN: [id number] Arrival date & time: 05/29/18  0749     History   Chief Complaint Chief Complaint  Patient presents with  . Headache  . Chest Pain    HPI Shannon Kemp is a 42 y.o. female.  HPI Patient presents with headache.  Dull in the back of her head.  States she had her eyes feeling heavy a couple days ago with a headache.  States she took some medicines and it went away.  Has history of migraines but states was sent to be of the front of her head.  No trauma.  States today the headache returned without the vision findings but states that it goes down her neck and down to her left arm.  Also feels little short of breath.  States dull left-sided chest pain.  States she has previous pneumothorax and that felt like this.  No lightheadedness dizziness.  States her heart rate always runs a little fast. Past Medical History:  Diagnosis Date  . Allergy   . Breast pain   . Bruises easily   . Fatigue   . Generalized headaches   . Nipple discharge   . Sleep difficulties   . Weight increase     Patient Active Problem List   Diagnosis Date Noted  . Major depression 07/19/2017  . Bipolar disorder ([place]) 07/18/2017    Past Surgical History:  Procedure Laterality Date  . LAPAROSCOPY  04/01/2012   Procedure: LAPAROSCOPY OPERATIVE;  Surgeon: Claiborne Billings A. Pamala Hurry, MD;  Location: [facility];  Service: Gynecology;  Laterality: N/A;  Operative laparoscopy/right salpingectomy and removal of ectopic pregnancy  . LAPAROSCOPY Left 05/09/2013   Procedure: LAPAROSCOPY OPERATIVE/ECTOPIC;  Surgeon: Linda Hedges, DO;  Location: [facility];  Service: Gynecology;  Laterality: Left;  WITH LEFT SALPINGECTOMY  . ORIF ULNAR FRACTURE    . PLEURAL SCARIFICATION       OB History    Gravida  5   Para  2   Term  0   Preterm  2   AB  2   Living  2     SAB  1   TAB  0   Ectopic  1   Multiple  0   Live Births                Home Medications    Prior to Admission medications   Medication Sig Start Date End Date Taking? Authorizing Provider  ARIPiprazole (ABILIFY) 5 MG tablet Take 1 tablet (5 mg total) by mouth daily. 04/24/18 04/24/19  Arfeen, Arlyce Harman, MD  hydrOXYzine (ATARAX/VISTARIL) 50 MG tablet Take 1 tablet (50 mg total) by mouth 3 (three) times daily. For anxiety 04/24/18   Arfeen, Arlyce Harman, MD  methocarbamol (ROBAXIN) 500 MG tablet Take 1 tablet (500 mg total) by mouth every 8 (eight) hours as needed for muscle spasms. 05/29/18   Davonna Belling, MD  traZODone (DESYREL) 50 MG tablet Take 1 tablet (50 mg total) by mouth at bedtime as needed for sleep. 10/26/17   Arfeen, Arlyce Harman, MD  venlafaxine XR (EFFEXOR-XR) 75 MG 24 hr capsule Take 1 capsule (75 mg total) by mouth daily. For mood control 04/24/18   Arfeen, Arlyce Harman, MD    Family History Family History  Problem Relation Age of Onset  . Hypertension Mother   . Hypertension Father     Social History Social History   Tobacco Use  . Smoking status: Former Research scientist (life sciences)  .  Smokeless tobacco: Never Used  Substance Use Topics  . Alcohol use: No  . Drug use: No     Allergies   Morphine and related; Adhesive [tape]; and Latex   Review of Systems Review of Systems  Constitutional: Negative for appetite change.  HENT: Negative for congestion.   Eyes: Positive for visual disturbance.  Respiratory: Positive for shortness of breath.   Cardiovascular: Positive for chest pain. Negative for leg swelling.  Gastrointestinal: Negative for abdominal pain.  Genitourinary: Negative for flank pain.  Musculoskeletal: Positive for neck pain. Negative for gait problem.  Skin: Negative for rash.  Neurological: Positive for headaches. Negative for weakness.  Psychiatric/Behavioral: Negative for confusion.     Physical Exam Updated Vital Signs BP (!) 128/97 (BP Location: Right Arm)   Pulse 99   Temp 97.9 F (36.6 C) (Oral)   Resp 12   LMP 05/01/2018   SpO2  100%   Physical Exam  Constitutional: She appears well-developed.  HENT:  Head: Normocephalic.  Eyes: EOM are normal.  Neck: Neck supple.  Tenderness over left trapezius on neck and down the shoulder.  Cardiovascular: Normal rate.  Mild tachycardia.  Pulmonary/Chest: Effort normal and breath sounds normal.  Musculoskeletal: Normal range of motion.  Neurological: She has normal strength.  Skin: Skin is dry.     ED Treatments / Results  Labs (all labs ordered are listed, but only abnormal results are displayed) Labs Reviewed  BASIC METABOLIC PANEL - Abnormal; Notable for the following components:      Result Value   Glucose, Bld 101 (*)    Calcium 8.6 (*)    All other components within normal limits  CBC  I-STAT TROPONIN, ED    EKG EKG Interpretation  Date/Time:  Tuesday May 29 2018 08:04:02 EDT Ventricular Rate:  126 PR Interval:    QRS Duration: 81 QT Interval:  308 QTC Calculation: 446 R Axis:   65 Text Interpretation:  Sinus tachycardia Borderline repolarization abnormality Confirmed by Davonna Belling [phone]) on 05/29/2018 8:13:54 AM Also confirmed by Davonna Belling [phone]), editor Philomena Doheny [phone])  on 05/29/2018 9:07:44 AM   Radiology Dg Chest 2 View  Result Date: 05/29/2018 CLINICAL DATA:  New onset of left neck, left arm, and left-sided chest pressure sensation this morning. History of headache for the past week. Previous history of pneumothoraces. Former smoker. EXAM: CHEST - 2 VIEW COMPARISON:  Chest x-ray of June 26, 2017 FINDINGS: The lungs are well-expanded and clear. The heart and pulmonary vascularity are normal. The mediastinum is normal in width. There is no pleural effusion. The trachea is midline. The bony thorax exhibits no acute abnormality. IMPRESSION: There is no active cardiopulmonary disease. Electronically Signed   By: David  Martinique M.D.   On: 05/29/2018 09:03    Procedures Procedures (including critical care  time)  Medications Ordered in ED Medications  ketorolac (TORADOL) 30 MG/ML injection 30 mg (30 mg Intravenous Given 05/29/18 0843)     Initial Impression / Assessment and Plan / ED Course  I have reviewed the triage vital signs and the nursing notes.  Pertinent labs & imaging results that were available during my care of the patient were reviewed by me and considered in my medical decision making (see chart for details).     Patient with chest pain tachycardia and headache.  Work-up reassuring.  Previous pneumothorax.  X-ray reassuring.  Will discharge home.  Headache likely has musculoskeletal component. Final Clinical Impressions(s) / ED Diagnoses   Final diagnoses:  Atypical  chest pain  Nonintractable headache, unspecified chronicity pattern, unspecified headache type    ED Discharge Orders         Ordered    methocarbamol (ROBAXIN) 500 MG tablet  Every 8 hours PRN     05/29/18 1049           Davonna Belling, MD 05/29/18 1615

## 2018-05-30 ENCOUNTER — Encounter (HOSPITAL_COMMUNITY): Payer: Self-pay | Admitting: Emergency Medicine

## 2018-05-30 ENCOUNTER — Emergency Department (HOSPITAL_COMMUNITY): Payer: Managed Care, Other (non HMO)

## 2018-05-30 ENCOUNTER — Emergency Department (HOSPITAL_COMMUNITY)
Admission: EM | Admit: 2018-05-30 | Discharge: 2018-05-30 | Disposition: A | Discharge status: Home / Self Care | Payer: Managed Care, Other (non HMO) | Attending: Emergency Medicine | Admitting: Emergency Medicine

## 2018-05-30 DIAGNOSIS — M25512 Pain in left shoulder: Secondary | ICD-10-CM | POA: Diagnosis not present

## 2018-05-30 DIAGNOSIS — Z87891 Personal history of nicotine dependence: Secondary | ICD-10-CM | POA: Diagnosis not present

## 2018-05-30 DIAGNOSIS — Z79899 Other long term (current) drug therapy: Secondary | ICD-10-CM | POA: Diagnosis not present

## 2018-05-30 DIAGNOSIS — R51 Headache: Secondary | ICD-10-CM | POA: Insufficient documentation

## 2018-05-30 DIAGNOSIS — R519 Headache, unspecified: Secondary | ICD-10-CM

## 2018-05-30 LAB — POC URINE PREG, ED: Preg Test, Ur: NEGATIVE

## 2018-05-30 MED ORDER — SODIUM CHLORIDE 0.9 % IV BOLUS
1000.0000 mL | Freq: Once | INTRAVENOUS | Status: AC
  Administered 2018-05-30: 1000 mL via INTRAVENOUS

## 2018-05-30 MED ORDER — ACETAMINOPHEN 500 MG PO TABS
1000.0000 mg | ORAL_TABLET | Freq: Once | ORAL | Status: AC
  Administered 2018-05-30: 1000 mg via ORAL
  Filled 2018-05-30: qty 2

## 2018-05-30 MED ORDER — DEXAMETHASONE SODIUM PHOSPHATE 10 MG/ML IJ SOLN
10.0000 mg | Freq: Once | INTRAMUSCULAR | Status: AC
  Administered 2018-05-30: 10 mg via INTRAVENOUS
  Filled 2018-05-30: qty 1

## 2018-05-30 MED ORDER — METOCLOPRAMIDE HCL 5 MG/ML IJ SOLN
10.0000 mg | Freq: Once | INTRAMUSCULAR | Status: AC
  Administered 2018-05-30: 10 mg via INTRAVENOUS
  Filled 2018-05-30: qty 2

## 2018-05-30 MED ORDER — DIPHENHYDRAMINE HCL 50 MG/ML IJ SOLN
25.0000 mg | Freq: Once | INTRAMUSCULAR | Status: AC
  Administered 2018-05-30: 25 mg via INTRAVENOUS
  Filled 2018-05-30: qty 1

## 2018-05-30 NOTE — ED Provider Notes (Signed)
[facility] [facility] EMERGENCY DEPARTMENT Provider Note   CSN: [id number] Arrival date & time: 05/30/18  0808     History   Chief Complaint Chief Complaint  Patient presents with  . Headache    HPI Shannon Kemp is a 42 y.o. female.  HPI  Patient is a 42 year old female with a history of headaches, bipolar disorder, allergic rhinitis, and resting tachycardia (per patient) presenting for new pattern of headaches, and left-sided shoulder pain.  Patient reports that her symptoms began yesterday and she presented to the emergency department at [facility] and felt that her pain was more chest in origin, and had cardiac work-up.  Patient reports that today, she began having increased pressure in the posterior pressure.  Patient reports that her symptoms are worse when she is trying to go to sleep, and does not have early morning headaches or early morning nausea and vomiting.  She reports this headache is not making her particularly nauseous.  Patient denies any head trauma, neck stiffness or pain, fevers, or rashes.  Patient denies any weakness or numbness of her extremities, visual disturbance, or diplopia, or vertigo.  Patient reports she is tried muscle relaxants for her symptoms without full relief.  Past Medical History:  Diagnosis Date  . Allergy   . Breast pain   . Bruises easily   . Fatigue   . Generalized headaches   . Nipple discharge   . Sleep difficulties   . Weight increase     Patient Active Problem List   Diagnosis Date Noted  . Major depression 07/19/2017  . Bipolar disorder ([place]) 07/18/2017    Past Surgical History:  Procedure Laterality Date  . LAPAROSCOPY  04/01/2012   Procedure: LAPAROSCOPY OPERATIVE;  Surgeon: Claiborne Billings A. Pamala Hurry, MD;  Location: [facility];  Service: Gynecology;  Laterality: N/A;  Operative laparoscopy/right salpingectomy and removal of ectopic pregnancy  . LAPAROSCOPY Left 05/09/2013   Procedure: LAPAROSCOPY  OPERATIVE/ECTOPIC;  Surgeon: Linda Hedges, DO;  Location: [facility];  Service: Gynecology;  Laterality: Left;  WITH LEFT SALPINGECTOMY  . ORIF ULNAR FRACTURE    . PLEURAL SCARIFICATION       OB History    Gravida  5   Para  2   Term  0   Preterm  2   AB  2   Living  2     SAB  1   TAB  0   Ectopic  1   Multiple  0   Live Births               Home Medications    Prior to Admission medications   Medication Sig Start Date End Date Taking? Authorizing Provider  ARIPiprazole (ABILIFY) 5 MG tablet Take 1 tablet (5 mg total) by mouth daily. 04/24/18 04/24/19  Arfeen, Arlyce Harman, MD  hydrOXYzine (ATARAX/VISTARIL) 50 MG tablet Take 1 tablet (50 mg total) by mouth 3 (three) times daily. For anxiety 04/24/18   Arfeen, Arlyce Harman, MD  methocarbamol (ROBAXIN) 500 MG tablet Take 1 tablet (500 mg total) by mouth every 8 (eight) hours as needed for muscle spasms. 05/29/18   Davonna Belling, MD  traZODone (DESYREL) 50 MG tablet Take 1 tablet (50 mg total) by mouth at bedtime as needed for sleep. 10/26/17   Arfeen, Arlyce Harman, MD  venlafaxine XR (EFFEXOR-XR) 75 MG 24 hr capsule Take 1 capsule (75 mg total) by mouth daily. For mood control 04/24/18   Arfeen, Arlyce Harman, MD  Family History Family History  Problem Relation Age of Onset  . Hypertension Mother   . Hypertension Father     Social History Social History   Tobacco Use  . Smoking status: Former Research scientist (life sciences)  . Smokeless tobacco: Never Used  Substance Use Topics  . Alcohol use: No  . Drug use: No     Allergies   Morphine and related; Adhesive [tape]; and Latex   Review of Systems Review of Systems  Constitutional: Negative for chills and fever.  HENT: Positive for congestion. Negative for rhinorrhea, sinus pain and sore throat.   Eyes: Negative for visual disturbance.  Respiratory: Negative for cough, chest tightness and shortness of breath.   Cardiovascular: Negative for chest pain.  Gastrointestinal: Negative for abdominal  pain, constipation, diarrhea, nausea and vomiting.  Genitourinary: Negative for dysuria and flank pain.  Musculoskeletal: Positive for myalgias. Negative for back pain, neck pain and neck stiffness.  Skin: Negative for rash.  Neurological: Positive for dizziness and headaches. Negative for syncope, weakness, light-headedness and numbness.     Physical Exam Updated Vital Signs BP (!) 130/101   Pulse (!) 111   Temp 98.5 F (36.9 C) (Oral)   Resp 20   Ht 5' 5.5" (1.664 m)   Wt 76.2 kg   LMP 05/01/2018 (Approximate)   SpO2 100%   BMI 27.53 kg/m   Physical Exam  Constitutional: She appears well-developed and well-nourished. No distress.  HENT:  Head: Normocephalic and atraumatic.  Mouth/Throat: Oropharynx is clear and moist.  Eyes: Pupils are equal, round, and reactive to light. Conjunctivae and EOM are normal.  Neck: Normal range of motion. Neck supple. No neck rigidity.  Cardiovascular: Normal rate, regular rhythm, S1 normal and S2 normal.  No murmur heard. Pulmonary/Chest: Effort normal and breath sounds normal. She has no wheezes. She has no rales.  Abdominal: Soft. She exhibits no distension. There is no tenderness. There is no guarding.  Musculoskeletal: Normal range of motion. She exhibits no edema or deformity.  Left shoulder with tenderness to palpation over AC joint. Full passive ROM. Negative empty can test, negative Neer's. No swelling, erythema or ecchymosis present. No step-off, crepitus, or deformity appreciated. 5/5 muscle strength of LUE. 2+ radial pulse, sensation intact and all compartments soft.   Lymphadenopathy:    She has no cervical adenopathy.  Neurological: She is alert. GCS eye subscore is 4. GCS verbal subscore is 5. GCS motor subscore is 6.  Mental Status:  Alert, oriented, thought content appropriate, able to give a coherent history. Speech fluent without evidence of aphasia. Able to follow 2 step commands without difficulty.  Cranial Nerves:  II:   Peripheral visual fields grossly normal, pupils equal, round, reactive to light III,IV, VI: ptosis not present, extra-ocular motions intact bilaterally  V,VII: smile symmetric, facial light touch sensation equal VIII: hearing grossly normal to voice  X: uvula elevates symmetrically  XI: bilateral shoulder shrug symmetric and strong XII: midline tongue extension without fassiculations Motor:  Normal tone. 5/5 in upper and lower extremities bilaterally including strong and equal grip strength and dorsiflexion/plantar flexion Sensory: Pinprick and light touch normal in all extremities.  Deep Tendon Reflexes: 2+ and symmetric in the biceps and patella. No clonus. Cerebellar: normal finger-to-nose with bilateral upper extremities Gait: normal gait and balance Stance: Romberg negative. No pronator drift and good coordination, strength, and position sense with tapping of bilateral arms (performed in sitting position). CV: distal pulses palpable throughout    Skin: Skin is warm and dry. No  rash noted. No erythema.  Psychiatric: She has a normal mood and affect. Her behavior is normal. Judgment and thought content normal.  Nursing note and vitals reviewed.    ED Treatments / Results  Labs (all labs ordered are listed, but only abnormal results are displayed) Labs Reviewed  POC URINE PREG, ED    EKG None  Radiology Dg Chest 2 View  Result Date: 05/29/2018 CLINICAL DATA:  New onset of left neck, left arm, and left-sided chest pressure sensation this morning. History of headache for the past week. Previous history of pneumothoraces. Former smoker. EXAM: CHEST - 2 VIEW COMPARISON:  Chest x-ray of June 26, 2017 FINDINGS: The lungs are well-expanded and clear. The heart and pulmonary vascularity are normal. The mediastinum is normal in width. There is no pleural effusion. The trachea is midline. The bony thorax exhibits no acute abnormality. IMPRESSION: There is no active cardiopulmonary  disease. Electronically Signed   By: David  Martinique M.D.   On: 05/29/2018 09:03   Dg Shoulder Left  Result Date: 05/30/2018 CLINICAL DATA:  Pain lt shoulder into lt neck/2 days No trauma EXAM: LEFT SHOULDER - 2+ VIEW COMPARISON:  None. FINDINGS: There is no evidence of fracture or dislocation. There is no evidence of arthropathy or other focal bone abnormality. Soft tissues are unremarkable. IMPRESSION: Negative. Electronically Signed   By: Lucrezia Europe M.D.   On: 05/30/2018 13:22    Procedures Procedures (including critical care time)  Medications Ordered in ED Medications  sodium chloride 0.9 % bolus 1,000 mL (1,000 mLs Intravenous New Bag/Given 05/30/18 1328)  metoCLOPramide (REGLAN) injection 10 mg (10 mg Intravenous Given 05/30/18 1329)  diphenhydrAMINE (BENADRYL) injection 25 mg (25 mg Intravenous Given 05/30/18 1331)  dexamethasone (DECADRON) injection 10 mg (10 mg Intravenous Given 05/30/18 1329)  acetaminophen (TYLENOL) tablet 1,000 mg (1,000 mg Oral Given 05/30/18 1328)     Initial Impression / Assessment and Plan / ED Course  I have reviewed the triage vital signs and the nursing notes.  Pertinent labs & imaging results that were available during my care of the patient were reviewed by me and considered in my medical decision making (see chart for details).     Patient without high-risk features of headache including: sudden onset/thunderclap HA, no similar headache in past, altered mental status, accompanying seizure, headache with exertion, age > 21, history of immunocompromise, neck or shoulder pain, fever, use of anticoagulation, family history of spontaneous SAH, concomitant drug use, toxic exposure, or history of hypercoagulability.  Patient has a normal complete neurological exam, normal vital signs, normal level of consciousness, no signs of meningismus, is well-appearing/non-toxic appearing, no signs of trauma.   CT had obtained to assess for posterior fossa tumors or  mass-effect, or possible Chiari malformation.  This was negative for any acute intracranial normalities.  Suspect possible element of cervical radiculopathy given patient's pain radiating to the shoulder, as well as intermittent neck pain.   No dangerous or life-threatening conditions suspected or identified by history, physical exam, and by work-up. No indications for hospitalization identified.   Will refer patient to neurology for changing character of headaches.  Symptomatic therapy recommended for shoulder and headache.   This is a shared visit with Dr. Lajean Saver. Patient was independently evaluated by this attending physician. Attending physician consulted in evaluation and discharge management.  Final Clinical Impressions(s) / ED Diagnoses   Final diagnoses:  Headache, occipital  Acute pain of left shoulder    ED Discharge Orders  Ordered    Ambulatory referral to Neurology    Comments:  An appointment is requested in approximately: 4 weeks. Changing character of headaches, history of migraines. Sensation of posterior skull pressure w/ CT negative for posterior fossa tumor.   05/30/18 [place], Ariyah Sedlack B, PA-C 05/30/18 1601    Lajean Saver, MD 05/31/18 1051

## 2018-05-30 NOTE — ED Triage Notes (Signed)
Patient to ED c/o recurrent posterior headache/ head pressure onset this morning when she woke up. She states this has been going on for about a week, was seen at [facility] for same and they relieved her headache and gave her muscle relaxer - also reports L arm pain. Denies known injury.

## 2018-05-30 NOTE — Discharge Instructions (Signed)
Please see the information and instructions below regarding your visit.  Your diagnoses today include:  1. Headache, occipital   2. Acute pain of left shoulder     You were seen and treated in the emergency department today for headache and left shoulder pain. Fortunately, your vitals, exam, and work-up is reassuring with no apparent emergent cause for your headache at this time.  There may be an element of pinched nerve in the neck that is radiating to the shoulder.  Tests performed today include: See side panel of your discharge paperwork for testing performed today. Vital signs are listed at the bottom of these instructions.   Medications prescribed:    Try to avoid daily or regular use of tylenol, aspirin, ibuprofen, and other overt-the-counter pain medications as this can contribute to rebound headaches.   Please continue the muscle relaxant.  You may also apply Salon PAS patches to the left shoulder.   Take any prescribed medications only as prescribed, and any over the counter medications only as directed on the packaging.  Home care instructions:   Drink plenty of fluids at home. This will help with your headache. Be cautious with caffeine use, as this can cause your headache to rebound when the effects wear off. If you drink more than 2 cups of coffee/caffeinated tea, or caffeinated soda per day, I suggest you wean down that amount.  Please follow any educational materials contained in this packet.   Follow-up instructions: Please follow-up with your primary care provider in 2 weeks for further evaluation of your symptoms if they are not completely improved.   Please follow up with neurology in 2-4 weeks.   Return instructions:  Please return to the Emergency Department if you experience worsening symptoms. It is VERY important that you monitor your symptoms at home. If you develop worsening headache, new fever, new neck stiffness, rash, focal weakness or numbness, or  any other new or concerning symptoms, please return to the ED immediately, as these may be signs that your headache has become a potentially serious and life-threatening condition.  Please return if you have any other emergent concerns.  Additional Information:   Your vital signs today were: BP 119/89    Pulse 87    Temp 98.5 F (36.9 C) (Oral)    Resp 20    Ht 5' 5.5" (1.664 m)    Wt 76.2 kg    LMP 05/01/2018 (Approximate)    SpO2 100%    BMI 27.53 kg/m  If your blood pressure (BP) was elevated on multiple readings during this visit above 130 for the top number or above 80 for the bottom number, please have this repeated by your primary care provider within one month. --------------  Thank you for allowing [place] to participate in your care today.

## 2018-06-03 ENCOUNTER — Emergency Department (HOSPITAL_COMMUNITY)
Admission: EM | Admit: 2018-06-03 | Discharge: 2018-06-04 | Disposition: A | Discharge status: Home / Self Care | Payer: Managed Care, Other (non HMO) | Attending: Emergency Medicine | Admitting: Emergency Medicine

## 2018-06-03 ENCOUNTER — Encounter (HOSPITAL_COMMUNITY): Payer: Self-pay | Admitting: Emergency Medicine

## 2018-06-03 DIAGNOSIS — G44209 Tension-type headache, unspecified, not intractable: Secondary | ICD-10-CM | POA: Diagnosis not present

## 2018-06-03 DIAGNOSIS — R200 Anesthesia of skin: Secondary | ICD-10-CM | POA: Diagnosis present

## 2018-06-03 DIAGNOSIS — Z91040 Latex allergy status: Secondary | ICD-10-CM | POA: Insufficient documentation

## 2018-06-03 DIAGNOSIS — R519 Headache, unspecified: Secondary | ICD-10-CM

## 2018-06-03 DIAGNOSIS — R51 Headache: Secondary | ICD-10-CM

## 2018-06-03 DIAGNOSIS — Z79899 Other long term (current) drug therapy: Secondary | ICD-10-CM | POA: Diagnosis not present

## 2018-06-03 LAB — DIFFERENTIAL
Abs Immature Granulocytes: 0.1 10*3/uL (ref 0.0–0.1)
Basophils Absolute: 0.1 10*3/uL (ref 0.0–0.1)
Basophils Relative: 1 %
EOS PCT: 2 %
Eosinophils Absolute: 0.2 10*3/uL (ref 0.0–0.7)
Immature Granulocytes: 1 %
LYMPHS ABS: 2.6 10*3/uL (ref 0.7–4.0)
LYMPHS PCT: 28 %
Monocytes Absolute: 0.6 10*3/uL (ref 0.1–1.0)
Monocytes Relative: 6 %
NEUTROS ABS: 6 10*3/uL (ref 1.7–7.7)
Neutrophils Relative %: 62 %

## 2018-06-03 LAB — CBC
HEMATOCRIT: 41 % (ref 36.0–46.0)
Hemoglobin: 13.5 g/dL (ref 12.0–15.0)
MCH: 28.2 pg (ref 26.0–34.0)
MCHC: 32.9 g/dL (ref 30.0–36.0)
MCV: 85.6 fL (ref 78.0–100.0)
Platelets: 280 10*3/uL (ref 150–400)
RBC: 4.79 MIL/uL (ref 3.87–5.11)
RDW: 13.4 % (ref 11.5–15.5)
WBC: 9.5 10*3/uL (ref 4.0–10.5)

## 2018-06-03 LAB — CBG MONITORING, ED: GLUCOSE-CAPILLARY: 104 mg/dL — AB (ref 70–99)

## 2018-06-03 LAB — COMPREHENSIVE METABOLIC PANEL
ALK PHOS: 70 U/L (ref 38–126)
ALT: 14 U/L (ref 0–44)
ANION GAP: 7 (ref 5–15)
AST: 12 U/L — ABNORMAL LOW (ref 15–41)
Albumin: 3.5 g/dL (ref 3.5–5.0)
BILIRUBIN TOTAL: 0.5 mg/dL (ref 0.3–1.2)
BUN: 11 mg/dL (ref 6–20)
CALCIUM: 8.7 mg/dL — AB (ref 8.9–10.3)
CO2: 23 mmol/L (ref 22–32)
Chloride: 106 mmol/L (ref 98–111)
Creatinine, Ser: 0.73 mg/dL (ref 0.44–1.00)
GFR calc non Af Amer: >60 mL/min (ref >60)
GLUCOSE: 110 mg/dL — AB (ref 70–99)
Potassium: 3.3 mmol/L — ABNORMAL LOW (ref 3.5–5.1)
Sodium: 136 mmol/L (ref 135–145)
TOTAL PROTEIN: 6.4 g/dL — AB (ref 6.5–8.1)

## 2018-06-03 LAB — APTT: aPTT: 32 seconds (ref 24–36)

## 2018-06-03 LAB — I-STAT TROPONIN, ED: Troponin i, poc: 0.01 ng/mL (ref 0.00–0.08)

## 2018-06-03 LAB — I-STAT CHEM 8, ED
BUN: 12 mg/dL (ref 6–20)
Calcium, Ion: 1.18 mmol/L (ref 1.15–1.40)
Chloride: 105 mmol/L (ref 98–111)
Creatinine, Ser: 0.7 mg/dL (ref 0.44–1.00)
Glucose, Bld: 111 mg/dL — ABNORMAL HIGH (ref 70–99)
HEMATOCRIT: 40 % (ref 36.0–46.0)
Hemoglobin: 13.6 g/dL (ref 12.0–15.0)
Potassium: 3.3 mmol/L — ABNORMAL LOW (ref 3.5–5.1)
Sodium: 139 mmol/L (ref 135–145)
TCO2: 23 mmol/L (ref 22–32)

## 2018-06-03 LAB — I-STAT BETA HCG BLOOD, ED (MC, WL, AP ONLY)

## 2018-06-03 LAB — PROTIME-INR
INR: 0.98
PROTHROMBIN TIME: 12.8 s (ref 11.4–15.2)

## 2018-06-03 NOTE — ED Triage Notes (Signed)
Pt presents with multiple complaints. Pt states she has had HA, neck pain, shoulder pain, off/on X 1-2 weeks, has been seen twice for same already. Pt states she developed L facial numbness yesterday.

## 2018-06-03 NOTE — ED Notes (Signed)
CBG RESULT 104, RN AWARE

## 2018-06-04 ENCOUNTER — Encounter (HOSPITAL_COMMUNITY): Payer: Self-pay | Admitting: Radiology

## 2018-06-04 ENCOUNTER — Emergency Department (HOSPITAL_COMMUNITY): Payer: Managed Care, Other (non HMO)

## 2018-06-04 MED ORDER — METOCLOPRAMIDE HCL 5 MG/ML IJ SOLN
10.0000 mg | Freq: Once | INTRAMUSCULAR | Status: AC
  Administered 2018-06-04: 10 mg via INTRAVENOUS
  Filled 2018-06-04: qty 2

## 2018-06-04 MED ORDER — IOPAMIDOL (ISOVUE-370) INJECTION 76%
INTRAVENOUS | Status: AC
  Administered 2018-06-04: 02:00:00
  Filled 2018-06-04: qty 50

## 2018-06-04 MED ORDER — DIPHENHYDRAMINE HCL 50 MG/ML IJ SOLN
25.0000 mg | Freq: Once | INTRAMUSCULAR | Status: AC
  Administered 2018-06-04: 25 mg via INTRAVENOUS
  Filled 2018-06-04: qty 1

## 2018-06-04 MED ORDER — KETOROLAC TROMETHAMINE 30 MG/ML IJ SOLN
30.0000 mg | Freq: Once | INTRAMUSCULAR | Status: AC
  Administered 2018-06-04: 30 mg via INTRAVENOUS
  Filled 2018-06-04: qty 1

## 2018-06-04 NOTE — ED Provider Notes (Signed)
[facility] EMERGENCY DEPARTMENT Provider Note   CSN: [id number] Arrival date & time: 06/03/18  2043     History   Chief Complaint Chief Complaint  Patient presents with  . Numbness    HPI Shannon Kemp is a 42 y.o. female.  Patient presents to the emergency department with a chief complaint of headache.  She states that she was seen here a couple of days ago for the same.  She reports no improvement of her symptoms.  She states that her pain is on the back of her head, and radiates to her left shoulder and left neck.  She reports having some left-sided facial numbness and tingling over the past few days, which she describes intermittent.  She also reports intermittent blurred vision, but denies any vision loss or vision.  She denies any fevers, chills.  She dates that her symptoms are worsened when she turns her head to the right and bends her head to the right.  She denies any other associated symptoms.  The history is provided by the patient. No language interpreter was used.    Past Medical History:  Diagnosis Date  . Allergy   . Breast pain   . Bruises easily   . Fatigue   . Generalized headaches   . Nipple discharge   . Sleep difficulties   . Weight increase     Patient Active Problem List   Diagnosis Date Noted  . Major depression 07/19/2017  . Bipolar disorder ([place]) 07/18/2017    Past Surgical History:  Procedure Laterality Date  . LAPAROSCOPY  04/01/2012   Procedure: LAPAROSCOPY OPERATIVE;  Surgeon: Claiborne Billings A. Pamala Hurry, MD;  Location: [facility];  Service: Gynecology;  Laterality: N/A;  Operative laparoscopy/right salpingectomy and removal of ectopic pregnancy  . LAPAROSCOPY Left 05/09/2013   Procedure: LAPAROSCOPY OPERATIVE/ECTOPIC;  Surgeon: Linda Hedges, DO;  Location: [facility];  Service: Gynecology;  Laterality: Left;  WITH LEFT SALPINGECTOMY  . ORIF ULNAR FRACTURE    . PLEURAL SCARIFICATION       OB History    Gravida  5   Para  2   Term  0   Preterm  2   AB  2   Living  2     SAB  1   TAB  0   Ectopic  1   Multiple  0   Live Births               Home Medications    Prior to Admission medications   Medication Sig Start Date End Date Taking? Authorizing Provider  ARIPiprazole (ABILIFY) 5 MG tablet Take 1 tablet (5 mg total) by mouth daily. 04/24/18 04/24/19  Arfeen, Arlyce Harman, MD  hydrOXYzine (ATARAX/VISTARIL) 50 MG tablet Take 1 tablet (50 mg total) by mouth 3 (three) times daily. For anxiety Patient taking differently: Take 50 mg by mouth at bedtime as needed for anxiety.  04/24/18   Arfeen, Arlyce Harman, MD  ibuprofen (ADVIL,MOTRIN) 200 MG tablet Take 800 mg by mouth every 6 (six) hours as needed for headache (leg pain).    [provider]  methocarbamol (ROBAXIN) 500 MG tablet Take 1 tablet (500 mg total) by mouth every 8 (eight) hours as needed for muscle spasms. 05/29/18   Davonna Belling, MD  Polyethyl Glycol-Propyl Glycol (SYSTANE) 0.4-0.3 % SOLN Place 1 drop into both eyes daily as needed (dry eyes).    [provider]  traZODone (DESYREL) 50 MG tablet Take 1 tablet (50 mg  total) by mouth at bedtime as needed for sleep. Patient not taking: Reported on 05/30/2018 10/26/17   Kathlee Nations, MD  venlafaxine XR (EFFEXOR-XR) 75 MG 24 hr capsule Take 1 capsule (75 mg total) by mouth daily. For mood control 04/24/18   Arfeen, Arlyce Harman, MD    Family History Family History  Problem Relation Age of Onset  . Hypertension Mother   . Hypertension Father     Social History Social History   Tobacco Use  . Smoking status: Former Research scientist (life sciences)  . Smokeless tobacco: Never Used  Substance Use Topics  . Alcohol use: No  . Drug use: No     Allergies   Morphine and related; Adhesive [tape]; and Latex   Review of Systems Review of Systems  All other systems reviewed and are negative.    Physical Exam Updated Vital Signs BP (!) 130/96 (BP Location: Left Arm)   Pulse 90   Temp 98.2 F (36.8 C)  (Oral)   Resp 16   Ht 5\' 5"  (1.651 m)   Wt 77.6 kg   SpO2 97%   BMI 28.46 kg/m   Physical Exam  Constitutional: She is oriented to person, place, and time. She appears well-developed and well-nourished.  HENT:  Head: Normocephalic and atraumatic.  Right Ear: External ear normal.  Left Ear: External ear normal.  Eyes: Pupils are equal, round, and reactive to light. Conjunctivae and EOM are normal.  Neck: Normal range of motion. Neck supple.  No pain with neck flexion, no meningismus Left-sided upper trap tender to palpation  Cardiovascular: Normal rate, regular rhythm and normal heart sounds. Exam reveals no gallop and no friction rub.  No murmur heard. Pulmonary/Chest: Effort normal and breath sounds normal. No respiratory distress. She has no wheezes. She has no rales. She exhibits no tenderness.  Abdominal: Soft. She exhibits no distension and no mass. There is no tenderness. There is no rebound and no guarding.  Musculoskeletal: Normal range of motion. She exhibits no edema or tenderness.  Normal gait. No bony abnormality or deformity left shoulder  Neurological: She is alert and oriented to person, place, and time. She has normal reflexes.  CN 3-12 intact, normal finger to nose, no pronator drift, sensation and strength intact bilaterally.  Skin: Skin is warm and dry.  Psychiatric: She has a normal mood and affect. Her behavior is normal. Judgment and thought content normal.  Nursing note and vitals reviewed.    ED Treatments / Results  Labs (all labs ordered are listed, but only abnormal results are displayed) Labs Reviewed  COMPREHENSIVE METABOLIC PANEL - Abnormal; Notable for the following components:      Result Value   Potassium 3.3 (*)    Glucose, Bld 110 (*)    Calcium 8.7 (*)    Total Protein 6.4 (*)    AST 12 (*)    All other components within normal limits  CBG MONITORING, ED - Abnormal; Notable for the following components:   Glucose-Capillary 104 (*)     All other components within normal limits  I-STAT CHEM 8, ED - Abnormal; Notable for the following components:   Potassium 3.3 (*)    Glucose, Bld 111 (*)    All other components within normal limits  PROTIME-INR  APTT  CBC  DIFFERENTIAL  I-STAT TROPONIN, ED  I-STAT BETA HCG BLOOD, ED ([facility], [facility], [facility] ONLY)    EKG None  Radiology No results found.  Procedures Procedures (including critical care time)  Medications Ordered in  ED Medications - No data to display   Initial Impression / Assessment and Plan / ED Course  I have reviewed the triage vital signs and the nursing notes.  Pertinent labs & imaging results that were available during my care of the patient were reviewed by me and considered in my medical decision making (see chart for details).     Patient with headache in her occipital scalp and radiating pain to her left neck and left shoulder.  Symptoms are worsened when she bends her neck to the right or turns her head to the right.  She has had some blurred vision, but denies any now.  She also reports some tingling to the left side of her face, but denies any now.  Given the symptoms, will check CT angios head and neck.  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 Faythe Ghee, who recommends MRI brain.  Also advise giving patient headache cocktail.  If MRI brain is negative, plan is for outpatient neurology follow-up.  6:14 AM Patient signed out to [facility], [place], who will follow-up on MRI.  If normal variant, can discharge patient to home with neuro f/u, otherwise anticipate consulting with on-call neurology.  Final Clinical Impressions(s) / ED Diagnoses   Final diagnoses:  Nonintractable headache, unspecified chronicity pattern, unspecified headache type    ED Discharge Orders    None       Montine Circle, PA-C 06/04/18 0300    Orpah Greek, MD 06/04/18 [phone]

## 2018-06-04 NOTE — ED Notes (Signed)
Pt going to MRI

## 2018-06-04 NOTE — Discharge Instructions (Signed)
Alternate 600 mg of ibuprofen and [phone] mg of Tylenol every 3 hours as needed for pain. Do not exceed 4000 mg of Tylenol daily.  Take ibuprofen with food to avoid upset stomach issues.  Alternatively, you can take the ibuprofen and Tylenol at the doses mentioned together every 6 hours instead of alternating every 3 hours.  Drink plenty water and get plenty of rest.  You can apply heat 20 minutes on 20 minutes off for comfort as well.  Do some gentle stretching throughout the day to avoid muscle stiffness.  Follow-up with a neurologist for reevaluation of your symptoms.  Return to the emergency department if any concerning signs or symptoms develop such as facial droop, slurred speech, worsening headache, fevers, passing out, or weakness.

## 2018-06-04 NOTE — ED Provider Notes (Signed)
Received patient at signout from [facility].  Refer to provider note for full history and physical examination.  Briefly, patient is a 42 year old female with history of posterior headaches radiating to the left side of the head and neck.  Also notes facial tingling and numbness intermittent blurry vision.  CTA of the head and neck showed possible vasculopathy and neurology was consulted.  Dr. Malen Gauze recommended obtaining MRI.  Pending results of imaging.  If normal variant, patient stable for discharge home with neurology follow-up.  If any abnormality noted, will consult neurology for further recommendations.  Physical Exam  BP (!) 119/93   Pulse 80   Temp 98.2 F (36.8 C) (Oral)   Resp 13   Ht 5\' 5"  (1.651 m)   Wt 77.6 kg   LMP 06/03/2018 (Exact Date)   SpO2 97%   BMI 28.46 kg/m   Physical Exam  Constitutional: She appears well-developed and well-nourished. No distress.  Resting comfortably in bed  HENT:  Head: Normocephalic and atraumatic.  Eyes: Conjunctivae are normal. Right eye exhibits no discharge. Left eye exhibits no discharge.  Neck: No JVD present. No tracheal deviation present.  Cardiovascular: Normal rate.  Pulmonary/Chest: Effort normal.  Abdominal: She exhibits no distension.  Musculoskeletal: She exhibits no edema.  Neurological: She is alert.  Fluent speech with no evidence of dysarthria or aphasia, no facial droop.  Cranial nerves appear grossly intact.  Moving extremities spontaneously without difficulty.  Skin: No erythema.  Psychiatric: She has a normal mood and affect. Her behavior is normal.  Nursing note and vitals reviewed.   MDM  MRI with no acute abnormalities.  Spoke with Dr. Leonel Ramsay who recommends discharge home and follow-up with neurology on an outpatient basis.  On reassessment patient is resting comfortably no apparent distress.  She states her headache has improved significantly.  Stable for discharge home with follow-up with neurology.   Discussed strict ED return precautions.  Patient and patient's significant other verbalized understanding of and agreement with plan and patient is stable for discharge home at this time.       Renita Papa, PA-C 06/04/18 0729    Orpah Greek, MD 06/04/18 [phone]

## 2018-06-05 ENCOUNTER — Ambulatory Visit (HOSPITAL_COMMUNITY): Payer: Self-pay | Admitting: Psychiatry

## 2018-06-06 ENCOUNTER — Encounter: Payer: Self-pay | Admitting: Neurology

## 2018-06-08 NOTE — Progress Notes (Signed)
NEUROLOGY CONSULTATION NOTE  Shannon SCHWEBACH MRN: [id number] DOB: [birth year]  Referring provider: Mathis Fare, MD (ED referral) Primary care provider: Precious Haws, MD  Reason for consult:  headache  HISTORY OF PRESENT ILLNESS: Shannon Kemp is a 42 year old female with migraines and Bipolar I disorder who presents for headaches.  History supplemented by ED notes.  She has classic migraines but not for a while.  These are different headaches. Onset:  2 weeks ago Location:  Bilateral occipital region radiating to the left neck and shoulder.  Hand would swell. Quality:  pressure Intensity:  Moderate to severe when laying down, mild when sitting up.  She denies thunderclap headache. Aura:  Squiggly lines preceding headache Prodrome:  no Postdrome:  no Associated symptoms:   Burning sensation in back of head.  Intermittent left sided facial numbness and tingling, intermittent bilateral blurred vision.  Due to the numbness, it was difficult to talk.  She denies associated nausea, vomiting or unilateral numbness or weakness of extremities. Duration:  Wakes up in morning with it.  Lasts all day but may be dull. Frequency:  Daily. Frequency of abortive medication: acetaminophen and ibuprofen daily. Exacerbating factors:  Laying down on her back Relieving factors:  Sitting up or standing.  She was evaluated and treated twice in the [facility] ED on 05/30/18 and 06/03/18 for the same.  X-ray of left shoulder from 05/30/18 was normal.  MRI of brain without contrast from 06/04/18 was personally reviewed and was normal.  CTA of head and neck from 06/04/18 was personally reviewed and was unremarkable.  Current NSAIDS:  Ibuprofen 800mg  (only dulls, pain) Current analgesics:  Acetaminophen (only dulls pain) Current triptans:  no Current ergotamine:  no Current anti-emetic:  no Current muscle relaxants:  no Current anti-anxiolytic:  hydroxyzine Current sleep aide:  no Current  Antihypertensive medications:  no Current Antidepressant medications:  Venlafaxine XR 75mg  Current Anticonvulsant medications:  no Current anti-CGRP:  no Current Vitamins/Herbal/Supplements:  no Current Antihistamines/Decongestants:  no Other therapy:  no Hormone/birth control:  no Other medication:  Abilify 5mg   Past NSAIDS:  Naproxen 500mg  Past analgesics:  Tylenol Past abortive triptans:  no Past abortive ergotamine:  no Past muscle relaxants:  Robaxin 500mg  Past anti-emetic:  Zofran ODT 4mg  Past antihypertensive medications:  no Past antidepressant medications:  mirtazepine Past anticonvulsant medications:  no Past anti-CGRP:  no Past vitamins/Herbal/Supplements:  no Past antihistamines/decongestants:  no Other past therapies:  no  Caffeine: Dr. Malachi Bonds daily Alcohol:  no Smoker:  no Diet:  Dr. Malachi Bonds daily.  Drinks 2-3 cirkul bottles of water daily. Exercise:  Not routine Depression:  controlled; Anxiety:  controlled Other pain:  Occasionally allodynia of entire body Sleep hygiene:  poor Family history of headache:  no  06/03/18 CMP:  Na 136, K 3.3, Cl 106, CO2 23, glucose 110, BUN 11, Cr 0.73, Ca 8.7, t bili 0.5, ALP 70, AST 12, ALT 14.  PAST MEDICAL HISTORY: Past Medical History:  Diagnosis Date  . Allergy   . Breast pain   . Bruises easily   . Fatigue   . Generalized headaches   . Nipple discharge   . Sleep difficulties   . Weight increase     PAST SURGICAL HISTORY: Past Surgical History:  Procedure Laterality Date  . LAPAROSCOPY  04/01/2012   Procedure: LAPAROSCOPY OPERATIVE;  Surgeon: Claiborne Billings A. Pamala Hurry, MD;  Location: [facility];  Service: Gynecology;  Laterality: N/A;  Operative laparoscopy/right salpingectomy and removal of ectopic pregnancy  .  LAPAROSCOPY Left 05/09/2013   Procedure: LAPAROSCOPY OPERATIVE/ECTOPIC;  Surgeon: Linda Hedges, DO;  Location: [facility];  Service: Gynecology;  Laterality: Left;  WITH LEFT SALPINGECTOMY  . ORIF ULNAR FRACTURE    .  PLEURAL SCARIFICATION      MEDICATIONS: Current Outpatient Medications on File Prior to Visit  Medication Sig Dispense Refill  . ARIPiprazole (ABILIFY) 5 MG tablet Take 1 tablet (5 mg total) by mouth daily. 90 tablet 0  . hydrOXYzine (ATARAX/VISTARIL) 50 MG tablet Take 1 tablet (50 mg total) by mouth 3 (three) times daily. For anxiety (Patient taking differently: Take 50 mg by mouth at bedtime as needed for anxiety. ) 90 tablet 0  . ibuprofen (ADVIL,MOTRIN) 200 MG tablet Take 800 mg by mouth every 6 (six) hours as needed for headache (leg pain).    . methocarbamol (ROBAXIN) 500 MG tablet Take 1 tablet (500 mg total) by mouth every 8 (eight) hours as needed for muscle spasms. 8 tablet 0  . Polyethyl Glycol-Propyl Glycol (SYSTANE) 0.4-0.3 % SOLN Place 1 drop into both eyes daily as needed (dry eyes).    . traZODone (DESYREL) 50 MG tablet Take 1 tablet (50 mg total) by mouth at bedtime as needed for sleep. (Patient not taking: Reported on 05/30/2018) 30 tablet 0  . venlafaxine XR (EFFEXOR-XR) 75 MG 24 hr capsule Take 1 capsule (75 mg total) by mouth daily. For mood control 90 capsule 0   No current facility-administered medications on file prior to visit.     ALLERGIES: Allergies  Allergen Reactions  . Morphine And Related Itching and Other (See Comments)    hallucinations  . Adhesive [Tape] Rash  . Latex Rash    FAMILY HISTORY: Family History  Problem Relation Age of Onset  . Hypertension Mother   . Hypertension Father     SOCIAL HISTORY: Social History   Socioeconomic History  . Marital status: Married    Spouse name: Not on file  . Number of children: Not on file  . Years of education: Not on file  . Highest education level: Not on file  Occupational History  . Not on file  Social Needs  . Financial resource strain: Not on file  . Food insecurity:    Worry: Not on file    Inability: Not on file  . Transportation needs:    Medical: Not on file    Non-medical: Not on  file  Tobacco Use  . Smoking status: Former Research scientist (life sciences)  . Smokeless tobacco: Never Used  Substance and Sexual Activity  . Alcohol use: No  . Drug use: No  . Sexual activity: Not Currently  Lifestyle  . Physical activity:    Days per week: Not on file    Minutes per session: Not on file  . Stress: Not on file  Relationships  . Social connections:    Talks on phone: Not on file    Gets together: Not on file    Attends religious service: Not on file    Active member of club or organization: Not on file    Attends meetings of clubs or organizations: Not on file    Relationship status: Not on file  . Intimate partner violence:    Fear of current or ex partner: Not on file    Emotionally abused: Not on file    Physically abused: Not on file    Forced sexual activity: Not on file  Other Topics Concern  . Not on file  Social History Narrative  .  Not on file    REVIEW OF SYSTEMS: Constitutional: No fevers, chills, or sweats, no generalized fatigue, change in appetite Eyes: No visual changes, double vision, eye pain Ear, nose and throat: No hearing loss, ear pain, nasal congestion, sore throat Cardiovascular: No chest pain, palpitations Respiratory:  No shortness of breath at rest or with exertion, wheezes GastrointestinaI: No nausea, vomiting, diarrhea, abdominal pain, fecal incontinence Genitourinary:  No dysuria, urinary retention or frequency Musculoskeletal:  No neck pain, back pain Integumentary: No rash, pruritus, skin lesions Neurological: as above Psychiatric: No depression, insomnia, anxiety Endocrine: No palpitations, fatigue, diaphoresis, mood swings, change in appetite, change in weight, increased thirst Hematologic/Lymphatic:  No purpura, petechiae. Allergic/Immunologic: no itchy/runny eyes, nasal congestion, recent allergic reactions, rashes  PHYSICAL EXAM: Blood pressure 126/88, pulse (!) 108, height 5' 5.5" (1.664 m), weight 169 lb (76.7 kg), last menstrual period  06/03/2018, SpO2 96 %, unknown if currently breastfeeding. General: No acute distress.  Head:  Normocephalic/atraumatic Eyes:  fundi examined but not visualized Neck: supple, no paraspinal tenderness, full range of motion Back: No paraspinal tenderness Heart: regular rate and rhythm Lungs: Clear to auscultation bilaterally. Vascular: No carotid bruits. Neurological Exam: Mental status: alert and oriented to person, place, and time, recent and remote memory intact, fund of knowledge intact, attention and concentration intact, speech fluent and not dysarthric, language intact. Cranial nerves: CN I: not tested CN II: pupils equal, round and reactive to light, visual fields intact CN III, IV, VI:  full range of motion, no nystagmus, no ptosis CN V: facial sensation intact CN VII: upper and lower face symmetric CN VIII: hearing intact CN IX, X: gag intact, uvula midline CN XI: sternocleidomastoid and trapezius muscles intact CN XII: tongue midline Bulk & Tone: normal, no fasciculations. Motor:  5/5 throughout  Sensation: temperature and vibration sensation intact. Deep Tendon Reflexes:  2+ throughout, toes downgoing.  Finger to nose testing:  Without dysmetria.  Heel to shin:  Without dysmetria.  Gait:  Normal station and stride.  Able to turn and tandem walk. Romberg negative.  IMPRESSION: 1.  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 2.  Cervicalgia with left sided cervical radiculopathy  PLAN: 1. Advised to ask her physician about increasing venlafaxine as it may help reduce migraines.  Otherwise, we can try topiramate. 2.  Take tizanidine 4mg  at bedtime 3.  Take sumatriptan 100mg  at earliest onset of migraine.  May repeat dose once in 2 hours if needed, not to exceed 2 tablets in 24 hours 4.  Limit use of pain relievers to no more than 2 days out of week to prevent risk of rebound or medication-overuse headache. 5.  Keep headache  diary 6.  Refer to physical therapy for neck pain and shoulder pain 7.  Follow up in 4 months.  Thank you for allowing me to take part in the care of this patient.  Metta Clines, DO  CC: Precious Haws, MD

## 2018-06-11 ENCOUNTER — Ambulatory Visit (INDEPENDENT_AMBULATORY_CARE_PROVIDER_SITE_OTHER): Payer: Managed Care, Other (non HMO) | Admitting: Neurology

## 2018-06-11 ENCOUNTER — Encounter: Payer: Self-pay | Admitting: Neurology

## 2018-06-11 ENCOUNTER — Other Ambulatory Visit: Payer: Self-pay

## 2018-06-11 VITALS — BP 126/88 | HR 108 | Ht 65.5 in | Wt 169.0 lb

## 2018-06-11 DIAGNOSIS — G43109 Migraine with aura, not intractable, without status migrainosus: Secondary | ICD-10-CM | POA: Diagnosis not present

## 2018-06-11 DIAGNOSIS — M5412 Radiculopathy, cervical region: Secondary | ICD-10-CM | POA: Diagnosis not present

## 2018-06-11 MED ORDER — TIZANIDINE HCL 4 MG PO TABS: 4.0000 mg | ORAL_TABLET | Freq: Every day | ORAL | 3 refills | Status: DC

## 2018-06-11 MED ORDER — SUMATRIPTAN SUCCINATE 100 MG PO TABS: ORAL_TABLET | ORAL | 3 refills | Status: DC

## 2018-06-11 NOTE — Patient Instructions (Signed)
1. Discuss with your doctor about increasing venlafaxine as it may help reduce migraines 2.  Take tizanidine 4mg  at bedtime 3.  Take sumatriptan 100mg  at earliest onset of migraine.  May repeat dose once in 2 hours if needed, not to exceed 2 tablets in 24 hours 4.  Limit use of pain relievers to no more than 2 days out of week to prevent risk of rebound or medication-overuse headache. 5.  Keep headache diary 6.  Refer to physical therapy for neck pain and shoulder pain 7.  Follow up in 4 months.

## 2018-07-11 ENCOUNTER — Ambulatory Visit (HOSPITAL_COMMUNITY): Payer: Self-pay | Admitting: Psychiatry

## 2018-08-09 ENCOUNTER — Ambulatory Visit (HOSPITAL_COMMUNITY): Payer: Self-pay | Admitting: Psychiatry

## 2018-08-21 ENCOUNTER — Telehealth (HOSPITAL_COMMUNITY): Payer: Self-pay

## 2018-08-21 DIAGNOSIS — F319 Bipolar disorder, unspecified: Secondary | ICD-10-CM

## 2018-08-21 MED ORDER — ARIPIPRAZOLE 5 MG PO TABS: 5.0000 mg | ORAL_TABLET | Freq: Every day | ORAL | 0 refills | Status: DC

## 2018-08-21 MED ORDER — VENLAFAXINE HCL ER 75 MG PO CP24: 75.0000 mg | ORAL_CAPSULE | Freq: Every day | ORAL | 0 refills | Status: DC

## 2018-08-21 NOTE — Telephone Encounter (Signed)
Refill done.  

## 2018-08-21 NOTE — Telephone Encounter (Signed)
Patients medication request was denied due to needing an appointment, patient made appointment today and would like to know if she can get a refill until her appointment in January. Patient takes Abilify and Effexor.

## 2018-10-12 ENCOUNTER — Ambulatory Visit (INDEPENDENT_AMBULATORY_CARE_PROVIDER_SITE_OTHER): Payer: 59 | Admitting: Psychiatry

## 2018-10-12 ENCOUNTER — Encounter (HOSPITAL_COMMUNITY): Payer: Self-pay | Admitting: Psychiatry

## 2018-10-12 DIAGNOSIS — F319 Bipolar disorder, unspecified: Secondary | ICD-10-CM | POA: Diagnosis not present

## 2018-10-12 DIAGNOSIS — F411 Generalized anxiety disorder: Secondary | ICD-10-CM

## 2018-10-12 MED ORDER — VENLAFAXINE HCL ER 75 MG PO CP24: 75.0000 mg | ORAL_CAPSULE | Freq: Every day | ORAL | 0 refills | Status: DC

## 2018-10-12 MED ORDER — ARIPIPRAZOLE 5 MG PO TABS: 5.0000 mg | ORAL_TABLET | Freq: Every day | ORAL | 0 refills | Status: DC

## 2018-10-12 MED ORDER — HYDROXYZINE HCL 50 MG PO TABS: 50.0000 mg | ORAL_TABLET | Freq: Every evening | ORAL | 0 refills | Status: DC | PRN
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
## 2018-10-17 ENCOUNTER — Ambulatory Visit: Payer: Self-pay | Admitting: Neurology

## 2018-11-19 ENCOUNTER — Encounter: Payer: Self-pay | Admitting: Neurology

## 2018-12-15 ENCOUNTER — Emergency Department (HOSPITAL_COMMUNITY): Payer: Managed Care, Other (non HMO)

## 2018-12-15 ENCOUNTER — Encounter (HOSPITAL_COMMUNITY): Payer: Self-pay | Admitting: Emergency Medicine

## 2018-12-15 ENCOUNTER — Emergency Department (HOSPITAL_COMMUNITY)
Admission: EM | Admit: 2018-12-15 | Discharge: 2018-12-15 | Disposition: A | Discharge status: Home / Self Care | Payer: Managed Care, Other (non HMO) | Attending: Emergency Medicine | Admitting: Emergency Medicine

## 2018-12-15 ENCOUNTER — Other Ambulatory Visit: Payer: Self-pay

## 2018-12-15 DIAGNOSIS — Z87891 Personal history of nicotine dependence: Secondary | ICD-10-CM | POA: Diagnosis not present

## 2018-12-15 DIAGNOSIS — J101 Influenza due to other identified influenza virus with other respiratory manifestations: Secondary | ICD-10-CM

## 2018-12-15 DIAGNOSIS — Z79899 Other long term (current) drug therapy: Secondary | ICD-10-CM | POA: Insufficient documentation

## 2018-12-15 DIAGNOSIS — Z91040 Latex allergy status: Secondary | ICD-10-CM | POA: Diagnosis not present

## 2018-12-15 DIAGNOSIS — R05 Cough: Secondary | ICD-10-CM | POA: Diagnosis present

## 2018-12-15 LAB — INFLUENZA PANEL BY PCR (TYPE A & B)
INFLAPCR: POSITIVE — AB
Influenza B By PCR: NEGATIVE

## 2018-12-15 MED ORDER — NAPROXEN 500 MG PO TABS: 500.0000 mg | ORAL_TABLET | Freq: Two times a day (BID) | ORAL | 0 refills | Status: DC

## 2018-12-15 MED ORDER — OSELTAMIVIR PHOSPHATE 75 MG PO CAPS: 75.0000 mg | ORAL_CAPSULE | Freq: Two times a day (BID) | ORAL | 0 refills | Status: DC

## 2018-12-15 MED ORDER — ACETAMINOPHEN 325 MG PO TABS
650.0000 mg | ORAL_TABLET | Freq: Once | ORAL | Status: AC
  Administered 2018-12-15: 650 mg via ORAL
  Filled 2018-12-15: qty 2

## 2018-12-15 MED ORDER — FLUTICASONE PROPIONATE 50 MCG/ACT NA SUSP: 1.0000 | Freq: Every day | NASAL | 0 refills | Status: DC

## 2018-12-15 MED ORDER — BENZONATATE 100 MG PO CAPS: 100.0000 mg | ORAL_CAPSULE | Freq: Three times a day (TID) | ORAL | 0 refills | Status: DC

## 2018-12-15 NOTE — ED Provider Notes (Signed)
[facility] DEPT Provider Note   CSN: [id number] Arrival date & time: 12/15/18  1432    History   Chief Complaint Chief Complaint  Patient presents with  . Cough  . Fever    HPI Shannon Kemp is a 43 y.o. female with a hx of allergies, bipolar disorder and depression who presents to the ED with complaints of flu like sxs x 1 week. Patient reports congestion, ear discomfort, sore throat (which has since resolved), cough that is dry- but feels she cannot cough sputum up & needs to, dyspnea primarily w/ coughing spells, generalized body aches, and waxing/waning fever. She notes that she initially was having temperatures hovering at 99, but over past 2 days developed higher temps at 101.2. Sxs are constant. Seen at [facility] 5 days prior- given inhaler which has seemed to help and prednisone. Taking as prescribed. She felt she started to feel better than worsened over past 2-3 days. Husband sick w/ similar sxs currently on augmentin for sinus infection. Denies chest pain, emesis, diarrhea, or abdominal pain. No recent foreign travel or known exposure to confirmed covid 19 cases.      HPI  Past Medical History:  Diagnosis Date  . Allergy   . Breast pain   . Bruises easily   . Fatigue   . Generalized headaches   . Nipple discharge   . Sleep difficulties   . Weight increase     Patient Active Problem List   Diagnosis Date Noted  . Major depression 07/19/2017  . Bipolar disorder ([place]) 07/18/2017    Past Surgical History:  Procedure Laterality Date  . BILATERAL OOPHORECTOMY    . LAPAROSCOPY  04/01/2012   Procedure: LAPAROSCOPY OPERATIVE;  Surgeon: Claiborne Billings A. Pamala Hurry, MD;  Location: [facility];  Service: Gynecology;  Laterality: N/A;  Operative laparoscopy/right salpingectomy and removal of ectopic pregnancy  . LAPAROSCOPY Left 05/09/2013   Procedure: LAPAROSCOPY OPERATIVE/ECTOPIC;  Surgeon: Linda Hedges, DO;  Location: [facility];  Service: Gynecology;   Laterality: Left;  WITH LEFT SALPINGECTOMY  . ORIF ULNAR FRACTURE    . PLEURAL SCARIFICATION       OB History    Gravida  5   Para  2   Term  0   Preterm  2   AB  2   Living  2     SAB  1   TAB  0   Ectopic  1   Multiple  0   Live Births               Home Medications    Prior to Admission medications   Medication Sig Start Date End Date Taking? Authorizing Provider  ARIPiprazole (ABILIFY) 5 MG tablet Take 1 tablet (5 mg total) by mouth daily. Must keep appointment for future refills. 10/12/18 10/12/19  Arfeen, Arlyce Harman, MD  hydrOXYzine (ATARAX/VISTARIL) 50 MG tablet Take 1 tablet (50 mg total) by mouth at bedtime as needed for anxiety. 10/12/18   Arfeen, Arlyce Harman, MD  ibuprofen (ADVIL,MOTRIN) 200 MG tablet Take 800 mg by mouth every 6 (six) hours as needed for headache (leg pain).    [provider]  Polyethyl Glycol-Propyl Glycol (SYSTANE) 0.4-0.3 % SOLN Place 1 drop into both eyes daily as needed (dry eyes).    [provider]  SUMAtriptan (IMITREX) 100 MG tablet Take 1 tablet earliest onset of migraine.  May repeat x1 in 2 hours if headache persists or recurs.  Do not exceed 2 tablets in 24h  06/11/18   Tomi Likens, Adam R, DO  tiZANidine (ZANAFLEX) 4 MG tablet Take 1 tablet (4 mg total) by mouth at bedtime. Patient not taking: Reported on 10/12/2018 06/11/18   Pieter Partridge, DO  venlafaxine XR (EFFEXOR-XR) 75 MG 24 hr capsule Take 1 capsule (75 mg total) by mouth daily. For mood control 10/12/18   Arfeen, Arlyce Harman, MD    Family History Family History  Problem Relation Age of Onset  . Hypertension Mother   . Diabetes Mother   . Osteoarthritis Mother   . Hypertension Father   . Cancer Father   . Stroke Father   . Osteoarthritis Father     Social History Social History   Tobacco Use  . Smoking status: Former Research scientist (life sciences)  . Smokeless tobacco: Never Used  Substance Use Topics  . Alcohol use: No  . Drug use: No     Allergies   Morphine and related;  Adhesive [tape]; and Latex   Review of Systems Review of Systems  Constitutional: Positive for chills and fever.  HENT: Positive for congestion, ear pain and sore throat (resolved). Negative for rhinorrhea and trouble swallowing.   Respiratory: Positive for cough and shortness of breath.   Cardiovascular: Negative for chest pain.  Gastrointestinal: Negative for abdominal pain, diarrhea and vomiting.  Musculoskeletal: Positive for myalgias.     Physical Exam Updated Vital Signs BP (!) 140/102 (BP Location: Left Arm)   Pulse (!) 106   Temp 99 F (37.2 C) (Oral)   Resp 18   Wt 77.1 kg   SpO2 98%   Breastfeeding No   BMI 28.29 kg/m   Physical Exam Vitals signs and nursing note reviewed.  Constitutional:      General: She is not in acute distress.    Appearance: She is well-developed.  HENT:     Head: Normocephalic and atraumatic.     Right Ear: Ear canal normal. Tympanic membrane is not perforated, erythematous, retracted or bulging.     Left Ear: Ear canal normal. Tympanic membrane is not perforated, erythematous, retracted or bulging.     Ears:     Comments: No mastoid erythema/swelling/tenderness.     Nose:     Right Sinus: No maxillary sinus tenderness or frontal sinus tenderness.     Left Sinus: No maxillary sinus tenderness or frontal sinus tenderness.     Mouth/Throat:     Pharynx: Uvula midline. Posterior oropharyngeal erythema (mild) present. No oropharyngeal exudate.     Comments: Posterior oropharynx is symmetric appearing. Patient tolerating own secretions without difficulty. No trismus. No drooling. No hot potato voice. No swelling beneath the tongue, submandibular compartment is soft.  Eyes:     General:        Right eye: No discharge.        Left eye: No discharge.     Conjunctiva/sclera: Conjunctivae normal.     Pupils: Pupils are equal, round, and reactive to light.  Neck:     Musculoskeletal: Normal range of motion and neck supple. No edema or neck  rigidity.  Cardiovascular:     Rate and Rhythm: Regular rhythm. Tachycardia present.     Heart sounds: No murmur.  Pulmonary:     Effort: Pulmonary effort is normal. No respiratory distress.     Breath sounds: Normal breath sounds. No wheezing, rhonchi or rales.  Abdominal:     General: There is no distension.     Palpations: Abdomen is soft.     Tenderness: There is no abdominal  tenderness.  Lymphadenopathy:     Cervical: No cervical adenopathy.  Skin:    General: Skin is warm and dry.     Findings: No rash.  Neurological:     Mental Status: She is alert.  Psychiatric:        Behavior: Behavior normal.    ED Treatments / Results  Labs (all labs ordered are listed, but only abnormal results are displayed) Labs Reviewed  INFLUENZA PANEL BY PCR (TYPE A & B) - Abnormal; Notable for the following components:      Result Value   Influenza A By PCR POSITIVE (*)    All other components within normal limits    EKG None  Radiology Dg Chest 2 View  Result Date: 12/15/2018 CLINICAL DATA:  Cough and fever EXAM: CHEST - 2 VIEW COMPARISON:  May 29, 2018 FINDINGS: Lungs are clear. The heart size and pulmonary vascularity are normal. No adenopathy. No bone lesions. IMPRESSION: No edema or consolidation. Electronically Signed   By: Lowella Grip III M.D.   On: 12/15/2018 15:51    Procedures Procedures (including critical care time)  Medications Ordered in ED Medications  acetaminophen (TYLENOL) tablet 650 mg (650 mg Oral Given 12/15/18 1510)     Initial Impression / Assessment and Plan / ED Course  I have reviewed the triage vital signs and the nursing notes.  Pertinent labs & imaging results that were available during my care of the patient were reviewed by me and considered in my medical decision making (see chart for details).    Patient presents with flu like symptoms.  Patient is nontoxic appearing, in no apparent distress, vitals w/ mild tachycardia, BP somewhat  elevated- doubt HTN emergency. Lungs CTA CXR  negative for infiltrate, doubt pneumonia. There is no wheezing or signs of respiratory distress, currently on steroids w/ PRN inhaler, do not feel further intervention with these classes of medicines is needed at this time.  No sinus tenderness, doubt acute bacterial sinusitis. Centor score 1 for temp at home, doubt strep pharyngitis. No evidence of AOM on exam. No meningeal signs. No recent travel or exposures to covid 19. Influenza testing is positive, per further discussion with the patient she did not truly develop fever w/ body aches until within past 48 hours, discussed risks/benefits of tamiflu, patient ultimately requested this medicine, also given supportive prescriptions including Ibuprofen, Flonase, and Tessalon. Ambulatory SpO2 maintained at 98-100% on RA, appears safe for discharge at this time. I discussed results, treatment plan, need for PCP follow-up, and return precautions with the patient. Provided opportunity for questions, patient confirmed understanding and is in agreement with plan.    Final Clinical Impressions(s) / ED Diagnoses   Final diagnoses:  Influenza A    ED Discharge Orders         Ordered    fluticasone (FLONASE) 50 MCG/ACT nasal spray  Daily     12/15/18 1625    naproxen (NAPROSYN) 500 MG tablet  2 times daily     12/15/18 1625    oseltamivir (TAMIFLU) 75 MG capsule  Every 12 hours     12/15/18 1625    benzonatate (TESSALON) 100 MG capsule  Every 8 hours     12/15/18 1625           Makita Blow, [place], PA-C 12/15/18 1627    Blanchie Dessert, MD 12/15/18 2047

## 2018-12-15 NOTE — Discharge Instructions (Addendum)
You were seen in the emergency today and diagnosed with flu A. Your chest xray was normal.  We are sending you home with the following medicines:   -Flonase to be used 1 spray in each nostril daily.  This medication is used to treat your congestion.  -Tessalon can be taken once every 8 hours as needed.  This medication is used to treat your cough.  -- Naproxen is a nonsteroidal anti-inflammatory medication that will help with pain and swelling. Be sure to take this medication as prescribed with food, 1 pill every 12 hours,  It should be taken with food, as it can cause stomach upset, and more seriously, stomach bleeding. Do not take other nonsteroidal anti-inflammatory medications with this such as Advil, Motrin, Aleve, Mobic, Goodie Powder, or Motrin.    -Tamiflu- medicine specifically to treat flu.    You make take Tylenol per over the counter dosing with these medications.    We have prescribed you new medication(s) today. Discuss the medications prescribed today with your pharmacist as they can have adverse effects and interactions with your other medicines including over the counter and prescribed medications. Seek medical evaluation if you start to experience new or abnormal symptoms after taking one of these medicines, seek care immediately if you start to experience difficulty breathing, feeling of your throat closing, facial swelling, or rash as these could be indications of a more serious allergic reaction  You will need to follow-up with your primary care provider in 3 days if your symptoms have not improved.  If you do not have a primary care provider one is provided in your discharge instructions.  Return to the emergency department for any new or worsening symptoms including but not limited to persistent fever for 5 days, difficulty breathing, chest pain, rashes, passing out, or any other concerns.

## 2018-12-15 NOTE — ED Triage Notes (Signed)
Pt reports one week hx of nonproductive cough. Seen at urgent care and tx with inhaler and prednisone for bronchitis. CXR not done. Pt reports intermittent fever

## 2019-01-02 ENCOUNTER — Encounter (HOSPITAL_COMMUNITY): Payer: Self-pay

## 2019-01-02 ENCOUNTER — Emergency Department (HOSPITAL_COMMUNITY): Payer: Managed Care, Other (non HMO)

## 2019-01-02 ENCOUNTER — Emergency Department (HOSPITAL_COMMUNITY)
Admission: EM | Admit: 2019-01-02 | Discharge: 2019-01-02 | Disposition: A | Discharge status: Home / Self Care | Payer: Managed Care, Other (non HMO) | Attending: Emergency Medicine | Admitting: Emergency Medicine

## 2019-01-02 ENCOUNTER — Other Ambulatory Visit: Payer: Self-pay

## 2019-01-02 DIAGNOSIS — Z87891 Personal history of nicotine dependence: Secondary | ICD-10-CM | POA: Insufficient documentation

## 2019-01-02 DIAGNOSIS — Z79899 Other long term (current) drug therapy: Secondary | ICD-10-CM | POA: Diagnosis not present

## 2019-01-02 DIAGNOSIS — Z91040 Latex allergy status: Secondary | ICD-10-CM | POA: Insufficient documentation

## 2019-01-02 DIAGNOSIS — J40 Bronchitis, not specified as acute or chronic: Secondary | ICD-10-CM | POA: Diagnosis not present

## 2019-01-02 DIAGNOSIS — R05 Cough: Secondary | ICD-10-CM | POA: Diagnosis present

## 2019-01-02 MED ORDER — HYDROCODONE-HOMATROPINE 5-1.5 MG/5ML PO SYRP: 5.0000 mL | ORAL_SOLUTION | Freq: Four times a day (QID) | ORAL | 0 refills | Status: DC | PRN

## 2019-01-02 MED ORDER — PREDNISONE 20 MG PO TABS: ORAL_TABLET | ORAL | 0 refills | Status: DC

## 2019-01-02 NOTE — ED Provider Notes (Signed)
[facility] DEPT Provider Note   CSN: [id number] Arrival date & time: 01/02/19  0224    History   Chief Complaint Chief Complaint  Patient presents with  . Cough    HPI Shannon Kemp is a 43 y.o. female.     Patient presents to the emergency department for evaluation of cough.  Patient reports that she has been experiencing cough with some shortness of breath for more than 2 weeks.  When symptoms initially began she tested positive for influenza A.  Her cough has not improved, she reports that worsens at night.  It has been keeping her up at night.  She has not had any fever associated with the cough since the original diagnosis of influenza 2 weeks ago.  Patient reports that she went to urgent care and was given prednisone and albuterol but this has not helped her symptoms.     Past Medical History:  Diagnosis Date  . Allergy   . Breast pain   . Bruises easily   . Fatigue   . Generalized headaches   . Nipple discharge   . Sleep difficulties   . Weight increase     Patient Active Problem List   Diagnosis Date Noted  . Major depression 07/19/2017  . Bipolar disorder ([place]) 07/18/2017    Past Surgical History:  Procedure Laterality Date  . BILATERAL OOPHORECTOMY    . LAPAROSCOPY  04/01/2012   Procedure: LAPAROSCOPY OPERATIVE;  Surgeon: Claiborne Billings A. Pamala Hurry, MD;  Location: [facility];  Service: Gynecology;  Laterality: N/A;  Operative laparoscopy/right salpingectomy and removal of ectopic pregnancy  . LAPAROSCOPY Left 05/09/2013   Procedure: LAPAROSCOPY OPERATIVE/ECTOPIC;  Surgeon: Linda Hedges, DO;  Location: [facility];  Service: Gynecology;  Laterality: Left;  WITH LEFT SALPINGECTOMY  . ORIF ULNAR FRACTURE    . PLEURAL SCARIFICATION       OB History    Gravida  5   Para  2   Term  0   Preterm  2   AB  2   Living  2     SAB  1   TAB  0   Ectopic  1   Multiple  0   Live Births               Home Medications     Prior to Admission medications   Medication Sig Start Date End Date Taking? Authorizing Provider  ARIPiprazole (ABILIFY) 5 MG tablet Take 1 tablet (5 mg total) by mouth daily. Must keep appointment for future refills. 10/12/18 10/12/19  Arfeen, Arlyce Harman, MD  benzonatate (TESSALON) 100 MG capsule Take 1 capsule (100 mg total) by mouth every 8 (eight) hours. 12/15/18   Petrucelli, Samantha R, PA-C  fluticasone (FLONASE) 50 MCG/ACT nasal spray Place 1 spray into both nostrils daily. 12/15/18   Petrucelli, Samantha R, PA-C  HYDROcodone-homatropine (HYCODAN) 5-1.5 MG/5ML syrup Take 5 mLs by mouth every 6 (six) hours as needed for cough. 01/02/19   Orpah Greek, MD  hydrOXYzine (ATARAX/VISTARIL) 50 MG tablet Take 1 tablet (50 mg total) by mouth at bedtime as needed for anxiety. 10/12/18   Arfeen, Arlyce Harman, MD  ibuprofen (ADVIL,MOTRIN) 200 MG tablet Take 800 mg by mouth every 6 (six) hours as needed for headache (leg pain).    [provider]  naproxen (NAPROSYN) 500 MG tablet Take 1 tablet (500 mg total) by mouth 2 (two) times daily. 12/15/18   Petrucelli, Glynda Jaeger, PA-C  oseltamivir (TAMIFLU) 75 MG  capsule Take 1 capsule (75 mg total) by mouth every 12 (twelve) hours. 12/15/18   Petrucelli, Samantha R, PA-C  Polyethyl Glycol-Propyl Glycol (SYSTANE) 0.4-0.3 % SOLN Place 1 drop into both eyes daily as needed (dry eyes).    [provider]  predniSONE (DELTASONE) 20 MG tablet 3 tabs po daily x 3 days, then 2 tabs x 3 days, then 1.5 tabs x 3 days, then 1 tab x 3 days, then 0.5 tabs x 3 days 01/02/19   Orpah Greek, MD  SUMAtriptan (IMITREX) 100 MG tablet Take 1 tablet earliest onset of migraine.  May repeat x1 in 2 hours if headache persists or recurs.  Do not exceed 2 tablets in 24h 06/11/18   Jaffe, Adam R, DO  tiZANidine (ZANAFLEX) 4 MG tablet Take 1 tablet (4 mg total) by mouth at bedtime. Patient not taking: Reported on 10/12/2018 06/11/18   Pieter Partridge, DO  venlafaxine XR  (EFFEXOR-XR) 75 MG 24 hr capsule Take 1 capsule (75 mg total) by mouth daily. For mood control 10/12/18   Arfeen, Arlyce Harman, MD    Family History Family History  Problem Relation Age of Onset  . Hypertension Mother   . Diabetes Mother   . Osteoarthritis Mother   . Hypertension Father   . Cancer Father   . Stroke Father   . Osteoarthritis Father     Social History Social History   Tobacco Use  . Smoking status: Former Research scientist (life sciences)  . Smokeless tobacco: Never Used  Substance Use Topics  . Alcohol use: No  . Drug use: No     Allergies   Morphine and related; Adhesive [tape]; and Latex   Review of Systems Review of Systems  Respiratory: Positive for cough, chest tightness and shortness of breath.   All other systems reviewed and are negative.    Physical Exam Updated Vital Signs BP (!) 117/97 (BP Location: Left Arm)   Pulse 99   Temp 98.4 F (36.9 C) (Oral)   Resp 20   Ht 5\' 5"  (1.651 m)   Wt 77.6 kg   LMP 12/30/2018   SpO2 97%   BMI 28.46 kg/m   Physical Exam Vitals signs and nursing note reviewed.  Constitutional:      General: She is not in acute distress.    Appearance: Normal appearance. She is well-developed.  HENT:     Head: Normocephalic and atraumatic.     Right Ear: Hearing normal.     Left Ear: Hearing normal.     Nose: Nose normal.  Eyes:     Conjunctiva/sclera: Conjunctivae normal.     Pupils: Pupils are equal, round, and reactive to light.  Neck:     Musculoskeletal: Normal range of motion and neck supple.  Cardiovascular:     Rate and Rhythm: Regular rhythm.     Heart sounds: S1 normal and S2 normal. No murmur. No friction rub. No gallop.   Pulmonary:     Effort: Pulmonary effort is normal. No respiratory distress.     Breath sounds: Normal breath sounds.  Chest:     Chest wall: No tenderness.  Abdominal:     General: Bowel sounds are normal.     Palpations: Abdomen is soft.     Tenderness: There is no abdominal tenderness. There is no  guarding or rebound. Negative signs include Murphy's sign and McBurney's sign.     Hernia: No hernia is present.  Musculoskeletal: Normal range of motion.  Skin:    General:  Skin is warm and dry.     Findings: No rash.  Neurological:     Mental Status: She is alert and oriented to person, place, and time.     GCS: GCS eye subscore is 4. GCS verbal subscore is 5. GCS motor subscore is 6.     Cranial Nerves: No cranial nerve deficit.     Sensory: No sensory deficit.     Coordination: Coordination normal.  Psychiatric:        Speech: Speech normal.        Behavior: Behavior normal.        Thought Content: Thought content normal.      ED Treatments / Results  Labs (all labs ordered are listed, but only abnormal results are displayed) Labs Reviewed - No data to display  EKG None  Radiology Dg Chest 2 View  Result Date: 01/02/2019 CLINICAL DATA:  Nonproductive cough.  Denies fevers. EXAM: CHEST - 2 VIEW COMPARISON:  12/15/2018 FINDINGS: The heart size and mediastinal contours are within normal limits. Both lungs are clear. The visualized skeletal structures are unremarkable. IMPRESSION: No active cardiopulmonary disease. Electronically Signed   By: Kerby Moors M.D.   On: 01/02/2019 02:59    Procedures Procedures (including critical care time)  Medications Ordered in ED Medications - No data to display   Initial Impression / Assessment and Plan / ED Course  I have reviewed the triage vital signs and the nursing notes.  Pertinent labs & imaging results that were available during my care of the patient were reviewed by me and considered in my medical decision making (see chart for details).        Shannon Kemp was evaluated in Emergency Department on 01/02/2019 for the symptoms described in the history of present illness. She was evaluated in the context of the global COVID-19 pandemic, which necessitated consideration that the patient might be at risk for infection  with the SARS-CoV-2 virus that causes COVID-19. Institutional protocols and algorithms that pertain to the evaluation of patients at risk for COVID-19 are in a state of rapid change based on information released by regulatory bodies including the CDC and federal and state organizations. These policies and algorithms were followed during the patient's care in the ED.  Patient presents with persistent cough, tightness in her chest and shortness of breath after initial diagnosis of influenza A 2 weeks ago.  She has tried a prednisone burst and albuterol without improvement.  Patient appears comfortable here in the ER.  She has not hypoxic or tachypneic.  Lungs are clear, no wheezing.  X-ray performed and is negative.  Patient does not require any further work-up.  Suspect she has some persistent bronchospasm secondary to the initial upper respiratory infection that was confirmed as influenza.  Will treat with continued albuterol and a prolonged steroid taper.  Consider concomitant COVID-19, but as patient is afebrile does not have any known contacts, feel that this is less likely.  She does not meet criteria for admission or testing.  Final Clinical Impressions(s) / ED Diagnoses   Final diagnoses:  Bronchitis    ED Discharge Orders         Ordered    HYDROcodone-homatropine (HYCODAN) 5-1.5 MG/5ML syrup  Every 6 hours PRN     01/02/19 0315    predniSONE (DELTASONE) 20 MG tablet     01/02/19 0315           Orpah Greek, MD 01/02/19 [id number]

## 2019-01-02 NOTE — ED Triage Notes (Signed)
Pt reports continued nonproductive cough. She was seen a little over 2 weeks ago and dx'd with the flu. Denies fevers, but reports continued cough and some SOB/chest tightness that is keeping her up at night. A&Ox4. Coughing during triage.

## 2019-01-05 ENCOUNTER — Telehealth: Payer: Managed Care, Other (non HMO) | Admitting: Physician Assistant

## 2019-01-05 ENCOUNTER — Encounter (HOSPITAL_COMMUNITY): Payer: Self-pay | Admitting: Emergency Medicine

## 2019-01-05 ENCOUNTER — Other Ambulatory Visit: Payer: Self-pay

## 2019-01-05 ENCOUNTER — Emergency Department (HOSPITAL_COMMUNITY)
Admission: EM | Admit: 2019-01-05 | Discharge: 2019-01-05 | Disposition: A | Discharge status: Home / Self Care | Payer: Managed Care, Other (non HMO) | Attending: Emergency Medicine | Admitting: Emergency Medicine

## 2019-01-05 DIAGNOSIS — R0602 Shortness of breath: Secondary | ICD-10-CM | POA: Diagnosis present

## 2019-01-05 DIAGNOSIS — Z87891 Personal history of nicotine dependence: Secondary | ICD-10-CM | POA: Insufficient documentation

## 2019-01-05 DIAGNOSIS — J40 Bronchitis, not specified as acute or chronic: Secondary | ICD-10-CM | POA: Diagnosis not present

## 2019-01-05 DIAGNOSIS — Z79899 Other long term (current) drug therapy: Secondary | ICD-10-CM | POA: Insufficient documentation

## 2019-01-05 DIAGNOSIS — Z91040 Latex allergy status: Secondary | ICD-10-CM | POA: Diagnosis not present

## 2019-01-05 DIAGNOSIS — H538 Other visual disturbances: Secondary | ICD-10-CM

## 2019-01-05 NOTE — ED Provider Notes (Signed)
[facility] EMERGENCY DEPARTMENT Provider Note   CSN: [id number] Arrival date & time: 01/05/19  1921    History   Chief Complaint Chief Complaint  Patient presents with  . Shortness of Breath    HPI Shannon Kemp is a 43 y.o. female with history of bipolar disorder presenting to emergency department today with chief complaint of cough x1 month.  Patient states she is currently being treated for bronchitis and has continued to cough.  The cough is improving and she is able to sleep at night. She was diagnosed with bronchitis a month ago and recently was started on Zithromax because it is possibly bacterial.  Patient also taking prednisone and using albuterol inhaler. Denies fever, chest pain, nausea, vomiting, abdominal pain, sinus pain, sore throat.  Patient states she is here because a coworker tested positive for COVID-19.  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  She worked 1 day and [employer] recently but is unknown if she had contact with this person.    History provided by patient.      Past Medical History:  Diagnosis Date  . Allergy   . Breast pain   . Bruises easily   . Fatigue   . Generalized headaches   . Nipple discharge   . Sleep difficulties   . Weight increase     Patient Active Problem List   Diagnosis Date Noted  . Major depression 07/19/2017  . Bipolar disorder (Julian) 07/18/2017    Past Surgical History:  Procedure Laterality Date  . BILATERAL OOPHORECTOMY    . LAPAROSCOPY  04/01/2012   Procedure: LAPAROSCOPY OPERATIVE;  Surgeon: Claiborne Billings A. Pamala Hurry, MD;  Location: [facility];  Service: Gynecology;  Laterality: N/A;  Operative laparoscopy/right salpingectomy and removal of ectopic pregnancy  . LAPAROSCOPY Left 05/09/2013   Procedure: LAPAROSCOPY OPERATIVE/ECTOPIC;  Surgeon: Linda Hedges, DO;  Location: [facility];  Service: Gynecology;   Laterality: Left;  WITH LEFT SALPINGECTOMY  . ORIF ULNAR FRACTURE    . PLEURAL SCARIFICATION       OB History    Gravida  5   Para  2   Term  0   Preterm  2   AB  2   Living  2     SAB  1   TAB  0   Ectopic  1   Multiple  0   Live Births               Home Medications    Prior to Admission medications   Medication Sig Start Date End Date Taking? Authorizing Provider  ARIPiprazole (ABILIFY) 5 MG tablet Take 1 tablet (5 mg total) by mouth daily. Must keep appointment for future refills. 10/12/18 10/12/19  Arfeen, Arlyce Harman, MD  benzonatate (TESSALON) 100 MG capsule Take 1 capsule (100 mg total) by mouth every 8 (eight) hours. 12/15/18   Petrucelli, Samantha R, PA-C  fluticasone (FLONASE) 50 MCG/ACT nasal spray Place 1 spray into both nostrils daily. 12/15/18   Petrucelli, Samantha R, PA-C  HYDROcodone-homatropine (HYCODAN) 5-1.5 MG/5ML syrup Take 5 mLs by mouth every 6 (six) hours as needed for cough. 01/02/19   Orpah Greek, MD  hydrOXYzine (ATARAX/VISTARIL) 50 MG tablet Take 1 tablet (50 mg total) by mouth at bedtime as needed for anxiety. 10/12/18   Arfeen, Arlyce Harman, MD  ibuprofen (ADVIL,MOTRIN) 200 MG tablet Take 800 mg by mouth  every 6 (six) hours as needed for headache (leg pain).    [provider]  naproxen (NAPROSYN) 500 MG tablet Take 1 tablet (500 mg total) by mouth 2 (two) times daily. 12/15/18   Petrucelli, Glynda Jaeger, PA-C  oseltamivir (TAMIFLU) 75 MG capsule Take 1 capsule (75 mg total) by mouth every 12 (twelve) hours. 12/15/18   Petrucelli, Samantha R, PA-C  Polyethyl Glycol-Propyl Glycol (SYSTANE) 0.4-0.3 % SOLN Place 1 drop into both eyes daily as needed (dry eyes).    [provider]  predniSONE (DELTASONE) 20 MG tablet 3 tabs po daily x 3 days, then 2 tabs x 3 days, then 1.5 tabs x 3 days, then 1 tab x 3 days, then 0.5 tabs x 3 days 01/02/19   Orpah Greek, MD  SUMAtriptan (IMITREX) 100 MG tablet Take 1 tablet earliest onset of  migraine.  May repeat x1 in 2 hours if headache persists or recurs.  Do not exceed 2 tablets in 24h 06/11/18   Jaffe, Adam R, DO  tiZANidine (ZANAFLEX) 4 MG tablet Take 1 tablet (4 mg total) by mouth at bedtime. Patient not taking: Reported on 10/12/2018 06/11/18   Pieter Partridge, DO  venlafaxine XR (EFFEXOR-XR) 75 MG 24 hr capsule Take 1 capsule (75 mg total) by mouth daily. For mood control 10/12/18   Arfeen, Arlyce Harman, MD    Family History Family History  Problem Relation Age of Onset  . Hypertension Mother   . Diabetes Mother   . Osteoarthritis Mother   . Hypertension Father   . Cancer Father   . Stroke Father   . Osteoarthritis Father     Social History Social History   Tobacco Use  . Smoking status: Former Research scientist (life sciences)  . Smokeless tobacco: Never Used  Substance Use Topics  . Alcohol use: No  . Drug use: No     Allergies   Morphine and related; Adhesive [tape]; and Latex   Review of Systems Review of Systems  Constitutional: Negative for chills and fever.  HENT: Negative for congestion, ear discharge, ear pain, sinus pressure, sinus pain and sore throat.   Eyes: Negative for pain and redness.  Respiratory: Positive for cough and shortness of breath.   Cardiovascular: Negative for chest pain.  Gastrointestinal: Negative for abdominal pain, constipation, diarrhea, nausea and vomiting.  Genitourinary: Negative for dysuria and hematuria.  Musculoskeletal: Negative for back pain and neck pain.  Skin: Negative for wound.  Neurological: Negative for weakness, numbness and headaches.     Physical Exam Updated Vital Signs BP (!) 130/96   Pulse (!) 104   Temp 98 F (36.7 C) (Oral)   LMP 12/30/2018   SpO2 92%   Physical Exam Vitals signs and nursing note reviewed.  Constitutional:      Appearance: She is well-developed. She is not toxic-appearing.  HENT:     Head: Normocephalic and atraumatic.     Right Ear: Tympanic membrane and external ear normal.     Left Ear: Tympanic  membrane and external ear normal.     Nose: Nose normal.     Mouth/Throat:     Mouth: Mucous membranes are moist.     Pharynx: Oropharynx is clear.  Eyes:     General: No scleral icterus.       Right eye: No discharge.        Left eye: No discharge.     Conjunctiva/sclera: Conjunctivae normal.  Neck:     Musculoskeletal: Normal range of motion.  Cardiovascular:  Rate and Rhythm: Regular rhythm. Tachycardia present.     Pulses: Normal pulses.     Heart sounds: Normal heart sounds.  Pulmonary:     Effort: Pulmonary effort is normal. No respiratory distress.     Breath sounds: Normal breath sounds. No wheezing, rhonchi or rales.     Comments: Lungs are clear to auscultation in all fields.  Speaking in full sentences, no accessory muscle use. SpO2 is 97% on room air during interview and physical exam Chest:     Chest wall: No tenderness.  Abdominal:     General: There is no distension.     Palpations: Abdomen is soft.  Musculoskeletal: Normal range of motion.  Skin:    General: Skin is warm and dry.  Neurological:     Mental Status: She is oriented to person, place, and time.     Comments: Fluent speech, no facial droop.  Psychiatric:        Behavior: Behavior normal.      ED Treatments / Results  Labs (all labs ordered are listed, but only abnormal results are displayed) Labs Reviewed - No data to display  EKG None  Radiology No results found.  Procedures Procedures (including critical care time)  Medications Ordered in ED Medications - No data to display   Initial Impression / Assessment and Plan / ED Course  I have reviewed the triage vital signs and the nursing notes.  Pertinent labs & imaging results that were available during my care of the patient were reviewed by me and considered in my medical decision making (see chart for details).    Shannon Kemp was evaluated in Emergency Department on 01/05/2019 for the symptoms described in the history of  present illness. She was evaluated in the context of the global COVID-19 pandemic, which necessitated consideration that the patient might be at risk for infection with the SARS-CoV-2 virus that causes COVID-19. Institutional protocols and algorithms that pertain to the evaluation of patients at risk for COVID-19 are in a state of rapid change based on information released by regulatory bodies including the CDC and federal and state organizations. These policies and algorithms were followed during the patient's care in the ED.   Patient is afebrile, overall well-appearing.  Currently being treated for bronchitis with Zithromax, albuterol inhaler and prednisone.  Her symptoms are improving.  She is here today because her employer thinks she needs to be tested for covid-19.  A coworker from a different store tested positive and it is unknown if patient had contact with that person.  She worked 1 shift in the store and cannot remember if the person was there or not.  Consider contaminant COVID-19, but as patient is afebrile and has unknown contacts feel that is less likely.  Her lungs are clear on exam and she is afebrile today.  She has been checking her temperature at home daily and has been afebrile.  At this time she does not meet criteria for admission or testing.   Patient is hemodynamically stable, in NAD, and able to ambulate in the ED without increasing dyspnea. SpO2> 95% while ambulating in room and during exam was consistently 97% on room air. Pt was tachycardic on arrival however during my exam heart rate was steadily in the 90s.  Evaluation does not show pathology that would require ongoing emergent intervention or inpatient treatment. I explained the diagnosis to the patient. Patient is comfortable with above plan and is stable for discharge at this time. All  questions were answered prior to disposition. Strict return precautions for returning to the ED were discussed. Encouraged follow up with  PCP.    This note was prepared with assistance of Systems analyst. Occasional wrong-word or sound-a-like substitutions may have occurred due to the inherent limitations of voice recognition software.    Final Clinical Impressions(s) / ED Diagnoses   Final diagnoses:  Bronchitis    ED Discharge Orders    None       Flint Melter 01/05/19 2109    Hayden Rasmussen, MD 01/06/19 1057

## 2019-01-05 NOTE — ED Triage Notes (Signed)
Pt here for continued Bronchitis symptoms and cough. States employer had positive COVID a few weeks ago and pt could have been exposed. Pt states cough has been ongoing for a month. Pt started on Zithromax Monday for possible "bacterial PNA" at UC. No fevers today.

## 2019-01-05 NOTE — ED Notes (Signed)
Patient verbalizes understanding of discharge instructions. Opportunity for questioning and answers were provided. Armband removed by staff, pt discharged from ED.  

## 2019-01-05 NOTE — Progress Notes (Signed)
Based on what you shared with me, I feel your condition warrants further evaluation and I recommend that you be seen for a face to face office visit.  The duration of symptoms coupled with shortness of breath and blurred vision warrants immediate assessment.  Please be seen this evening in the ER.    NOTE: If you entered your credit card information for this eVisit, you will not be charged. You may see a "hold" on your card for the $35 but that hold will drop off and you will not have a charge processed.  If you are having a true medical emergency please call 911.  If you need an urgent face to face visit, [facility] has four urgent care centers for your convenience.    PLEASE NOTE: THE [facility] LOCATIONS AND URGENT CARE CLINICS DO NOT HAVE THE TESTING FOR CORONAVIRUS COVID19 AVAILABLE.  IF YOU FEEL YOU NEED THIS TEST YOU MUST GO TO A TRIAGE LOCATION AT [facility]   [email or web address] to reserve your spot online an avoid wait times  [facility] [address] [place], [place] [address] Modified hours of operation: Monday-Friday, 10 AM to 6 PM  Saturday & Sunday 10 AM to 4 PM *Across the street from [place] (New Address!) [address] [place], [place] [address] *Just off [address], across the road from [place] hours of operation: Monday-Friday, 10 AM to 5 PM  Closed Saturday & Sunday   The following sites will take your insurance:  . [facility] Urgent [place] a Provider at this Location  [address] [place], [place] [address] . 10 am to 8 pm Monday-Friday . 12 pm to 8 pm Saturday-Sunday   . [facility] at [place] a Provider at this Location  [address], [place], [place] [address] . 8 am to 8 pm Monday-Friday .  9 am to 6 pm Saturday . 11 am to 6 pm Sunday   . [facility] Urgent Care at [place] Get Driving Directions  [address] [place],  [address] . 8 am to 8 pm Monday-Friday . 8 am to 4 pm Saturday-Sunday   Your e-visit answers were reviewed by a board certified advanced clinical practitioner to complete your personal care plan.  Thank you for using e-Visits.

## 2019-01-05 NOTE — Discharge Instructions (Signed)
You have been seen today for cough, shortness of breath. Please read and follow all provided instructions. Return to the emergency room for worsening condition or new concerning symptoms.    1. Medications:  Prescription-continue the current medications you are taking.  I did not make any changes to them today. Also continue usual home medications Take medications as prescribed. Please review all of the medicines and only take them if you do not have an allergy to them.   2. Treatment: rest, drink plenty of fluids  3. Follow Up: Please follow up with your primary doctor in 2-5 days for discussion of your diagnoses and further evaluation after today's visit if needed.  At this time you are low risk for COVID-19.  However we are recommending you self quarantine for 2 weeks given possible exposure and your viral symptoms.  Please follow CDC guidelines  It is also a possibility that you have an allergic reaction to any of the medicines that you have been prescribed - Everybody reacts differently to medications and while MOST people have no trouble with most medicines, you may have a reaction such as nausea, vomiting, rash, swelling, shortness of breath. If this is the case, please stop taking the medicine immediately and contact your physician.  ?

## 2019-01-11 ENCOUNTER — Other Ambulatory Visit: Payer: Self-pay

## 2019-01-11 ENCOUNTER — Ambulatory Visit (INDEPENDENT_AMBULATORY_CARE_PROVIDER_SITE_OTHER): Payer: Self-pay | Admitting: Psychiatry

## 2019-01-11 DIAGNOSIS — F319 Bipolar disorder, unspecified: Secondary | ICD-10-CM

## 2019-01-11 DIAGNOSIS — F411 Generalized anxiety disorder: Secondary | ICD-10-CM

## 2019-01-11 MED ORDER — VENLAFAXINE HCL ER 75 MG PO CP24: 75.0000 mg | ORAL_CAPSULE | Freq: Every day | ORAL | 0 refills | Status: DC

## 2019-01-11 MED ORDER — HYDROXYZINE HCL 50 MG PO TABS: 50.0000 mg | ORAL_TABLET | Freq: Every evening | ORAL | 0 refills | Status: DC | PRN

## 2019-01-11 MED ORDER — ARIPIPRAZOLE 5 MG PO TABS: 5.0000 mg | ORAL_TABLET | Freq: Every day | ORAL | 0 refills | Status: DC
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
## 2019-03-27 ENCOUNTER — Encounter (HOSPITAL_COMMUNITY): Payer: Self-pay

## 2019-03-27 ENCOUNTER — Emergency Department (HOSPITAL_COMMUNITY)
Admission: EM | Admit: 2019-03-27 | Discharge: 2019-03-27 | Disposition: A | Discharge status: Home / Self Care | Payer: Managed Care, Other (non HMO) | Attending: Emergency Medicine | Admitting: Emergency Medicine

## 2019-03-27 ENCOUNTER — Emergency Department (HOSPITAL_COMMUNITY): Payer: Managed Care, Other (non HMO)

## 2019-03-27 ENCOUNTER — Other Ambulatory Visit: Payer: Self-pay

## 2019-03-27 DIAGNOSIS — J40 Bronchitis, not specified as acute or chronic: Secondary | ICD-10-CM

## 2019-03-27 DIAGNOSIS — Z87891 Personal history of nicotine dependence: Secondary | ICD-10-CM | POA: Diagnosis not present

## 2019-03-27 DIAGNOSIS — R0602 Shortness of breath: Secondary | ICD-10-CM

## 2019-03-27 DIAGNOSIS — Z20828 Contact with and (suspected) exposure to other viral communicable diseases: Secondary | ICD-10-CM | POA: Insufficient documentation

## 2019-03-27 DIAGNOSIS — Z79899 Other long term (current) drug therapy: Secondary | ICD-10-CM | POA: Diagnosis not present

## 2019-03-27 DIAGNOSIS — Z91040 Latex allergy status: Secondary | ICD-10-CM | POA: Diagnosis not present

## 2019-03-27 HISTORY — DX: Pneumothorax, unspecified: J93.9

## 2019-03-27 LAB — BASIC METABOLIC PANEL
Anion gap: 8 (ref 5–15)
BUN: 13 mg/dL (ref 6–20)
CO2: 21 mmol/L — ABNORMAL LOW (ref 22–32)
Calcium: 8.6 mg/dL — ABNORMAL LOW (ref 8.9–10.3)
Chloride: 108 mmol/L (ref 98–111)
Creatinine, Ser: 0.56 mg/dL (ref 0.44–1.00)
GFR calc Af Amer: >60 mL/min (ref >60)
GFR calc non Af Amer: >60 mL/min (ref >60)
Glucose, Bld: 102 mg/dL — ABNORMAL HIGH (ref 70–99)
Potassium: 3.8 mmol/L (ref 3.5–5.1)
Sodium: 137 mmol/L (ref 135–145)

## 2019-03-27 LAB — CBC WITH DIFFERENTIAL/PLATELET
Abs Immature Granulocytes: 0.04 10*3/uL (ref 0.00–0.07)
Basophils Absolute: 0.1 10*3/uL (ref 0.0–0.1)
Basophils Relative: 1 %
Eosinophils Absolute: 0.2 10*3/uL (ref 0.0–0.5)
Eosinophils Relative: 2 %
HCT: 41.7 % (ref 36.0–46.0)
Hemoglobin: 13.8 g/dL (ref 12.0–15.0)
Immature Granulocytes: 0 %
Lymphocytes Relative: 29 %
Lymphs Abs: 2.6 10*3/uL (ref 0.7–4.0)
MCH: 29.1 pg (ref 26.0–34.0)
MCHC: 33.1 g/dL (ref 30.0–36.0)
MCV: 88 fL (ref 80.0–100.0)
Monocytes Absolute: 0.7 10*3/uL (ref 0.1–1.0)
Monocytes Relative: 7 %
Neutro Abs: 5.4 10*3/uL (ref 1.7–7.7)
Neutrophils Relative %: 61 %
Platelets: 253 10*3/uL (ref 150–400)
RBC: 4.74 MIL/uL (ref 3.87–5.11)
RDW: 13.7 % (ref 11.5–15.5)
WBC: 9 10*3/uL (ref 4.0–10.5)
nRBC: 0 % (ref 0.0–0.2)

## 2019-03-27 LAB — D-DIMER, QUANTITATIVE: D-Dimer, Quant: 0.96 ug/mL-FEU — ABNORMAL HIGH (ref 0.00–0.50)

## 2019-03-27 LAB — LACTIC ACID, PLASMA: Lactic Acid, Venous: 0.7 mmol/L (ref 0.5–1.9)

## 2019-03-27 LAB — TROPONIN I (HIGH SENSITIVITY): Troponin I (High Sensitivity): 2 ng/L (ref <18)

## 2019-03-27 MED ORDER — METHYLPREDNISOLONE SODIUM SUCC 125 MG IJ SOLR
125.0000 mg | Freq: Once | INTRAMUSCULAR | Status: AC
  Administered 2019-03-27: 05:00:00 125 mg via INTRAVENOUS
  Filled 2019-03-27: qty 2

## 2019-03-27 MED ORDER — ALBUTEROL SULFATE HFA 108 (90 BASE) MCG/ACT IN AERS
8.0000 | INHALATION_SPRAY | RESPIRATORY_TRACT | Status: AC
  Administered 2019-03-27: 8 via RESPIRATORY_TRACT
  Filled 2019-03-27: qty 6.7

## 2019-03-27 MED ORDER — IOHEXOL 350 MG/ML SOLN
100.0000 mL | Freq: Once | INTRAVENOUS | Status: AC | PRN
  Administered 2019-03-27: 100 mL via INTRAVENOUS

## 2019-03-27 MED ORDER — ACETAMINOPHEN 500 MG PO TABS
1000.0000 mg | ORAL_TABLET | Freq: Once | ORAL | Status: AC
  Administered 2019-03-27: 1000 mg via ORAL
  Filled 2019-03-27: qty 2

## 2019-03-27 MED ORDER — PREDNISONE 20 MG PO TABS: 40.0000 mg | ORAL_TABLET | Freq: Every day | ORAL | 0 refills | Status: AC

## 2019-03-27 MED ORDER — SODIUM CHLORIDE 0.9 % IV BOLUS
1000.0000 mL | Freq: Once | INTRAVENOUS | Status: AC
  Administered 2019-03-27: 1000 mL via INTRAVENOUS

## 2019-03-27 MED ORDER — SODIUM CHLORIDE (PF) 0.9 % IJ SOLN
INTRAMUSCULAR | Status: AC
  Filled 2019-03-27: qty 50

## 2019-03-27 MED ORDER — DOXYCYCLINE HYCLATE 100 MG PO CAPS: 100.0000 mg | ORAL_CAPSULE | Freq: Two times a day (BID) | ORAL | 0 refills | Status: AC

## 2019-03-27 NOTE — ED Triage Notes (Signed)
Pt arrived with complaints of chest pain, shortness of breath, pt has had a cough and sore throat for about a week, pt states multiple people at her work has tested positive for [phone]

## 2019-03-27 NOTE — Discharge Instructions (Signed)
You were seen in the emergency department today with shortness of breath.  Your symptoms are most consistent with bronchitis.  You did not show evidence of blood clot in the lungs.  We are testing you for COVID-19 and you will need to self quarantine until your results come back and you are feeling better.  I have written a work note to this effect.  You may take the prescriptions written as directed.  Please follow-up with your PCP and return to the emergency department with any new or worsening symptoms.  Your COVID-19 results will be available in [place].

## 2019-03-27 NOTE — ED Notes (Signed)
XR at bedside

## 2019-03-27 NOTE — ED Provider Notes (Signed)
Blood pressure 107/79, pulse (!) 135, temperature 98.6 F (37 C), temperature source Oral, resp. rate 20, height 5' 5.5" (1.664 m), last menstrual period 03/06/2019, SpO2 100 %.  Assuming care from Dr. Betsey Holiday.  In short, Shannon Kemp is a 43 y.o. female with a chief complaint of Shortness of Breath .  Refer to the original H&P for additional details.  The current plan of care is to f/u CTA and reassess.  08:20 AM  CT reviewed with no evidence of PE or other acute finding.  Will treat empirically for bronchitis.  Discussed with the patient that her COVID-19 test is pending and she will need to self quarantine until the results come back and her symptoms resolved.  I have written a work note to this effect.  Patient verbalized understanding of this.  Discussed ED return precautions.   Shannon Kemp was evaluated in Emergency Department on 03/27/2019 for the symptoms described in the history of present illness. She was evaluated in the context of the global COVID-19 pandemic, which necessitated consideration that the patient might be at risk for infection with the SARS-CoV-2 virus that causes COVID-19. Institutional protocols and algorithms that pertain to the evaluation of patients at risk for COVID-19 are in a state of rapid change based on information released by regulatory bodies including the CDC and federal and state organizations. These policies and algorithms were followed during the patient's care in the ED.     Margette Fast, MD 03/27/19 [phone]

## 2019-03-27 NOTE — ED Notes (Signed)
Pt ambulated from triage to room 12 without assistance. Gait steady

## 2019-03-27 NOTE — ED Notes (Signed)
Patient transported to CT 

## 2019-03-27 NOTE — ED Provider Notes (Signed)
[facility] DEPT Provider Note   CSN: [id number] Arrival date & time: 03/27/19  0429    History   Chief Complaint Chief Complaint  Patient presents with  . Shortness of Breath    HPI KARYSA HEFT is a 43 y.o. female.     Patient presents to the emergency department for evaluation of shortness of breath.  Patient reports that she has not been feeling well for a week.  She started with a sore throat last week and then developed diffuse congestion.  Several days ago she was experiencing nausea and diarrhea which resolved.  Patient reports that 2 days ago she started to feel better but then tonight she was unable to sleep because she was feeling tight in her chest and short of breath.  Patient used her inhaler without improvement.  Patient is concerned because she has had positive COVID contact at work.     Past Medical History:  Diagnosis Date  . Allergy   . Breast pain   . Bruises easily   . Fatigue   . Generalized headaches   . Nipple discharge   . Pneumothorax   . Sleep difficulties   . Weight increase     Patient Active Problem List   Diagnosis Date Noted  . Major depression 07/19/2017  . Bipolar disorder ([place]) 07/18/2017    Past Surgical History:  Procedure Laterality Date  . BILATERAL OOPHORECTOMY    . LAPAROSCOPY  04/01/2012   Procedure: LAPAROSCOPY OPERATIVE;  Surgeon: Claiborne Billings A. Pamala Hurry, MD;  Location: [facility];  Service: Gynecology;  Laterality: N/A;  Operative laparoscopy/right salpingectomy and removal of ectopic pregnancy  . LAPAROSCOPY Left 05/09/2013   Procedure: LAPAROSCOPY OPERATIVE/ECTOPIC;  Surgeon: Linda Hedges, DO;  Location: [facility];  Service: Gynecology;  Laterality: Left;  WITH LEFT SALPINGECTOMY  . ORIF ULNAR FRACTURE    . PLEURAL SCARIFICATION       OB History    Gravida  5   Para  2   Term  0   Preterm  2   AB  2   Living  2     SAB  1   TAB  0   Ectopic  1   Multiple  0   Live Births              Home Medications    Prior to Admission medications   Medication Sig Start Date End Date Taking? Authorizing Provider  ARIPiprazole (ABILIFY) 5 MG tablet Take 1 tablet (5 mg total) by mouth daily. Must keep appointment for future refills. 01/11/19 01/11/20  Arfeen, Arlyce Harman, MD  benzonatate (TESSALON) 100 MG capsule Take 1 capsule (100 mg total) by mouth every 8 (eight) hours. 12/15/18   Petrucelli, Samantha R, PA-C  fluticasone (FLONASE) 50 MCG/ACT nasal spray Place 1 spray into both nostrils daily. 12/15/18   Petrucelli, Samantha R, PA-C  HYDROcodone-homatropine (HYCODAN) 5-1.5 MG/5ML syrup Take 5 mLs by mouth every 6 (six) hours as needed for cough. 01/02/19   Orpah Greek, MD  hydrOXYzine (ATARAX/VISTARIL) 50 MG tablet Take 1 tablet (50 mg total) by mouth at bedtime as needed for anxiety. 01/11/19   Arfeen, Arlyce Harman, MD  ibuprofen (ADVIL,MOTRIN) 200 MG tablet Take 800 mg by mouth every 6 (six) hours as needed for headache (leg pain).    [provider]  naproxen (NAPROSYN) 500 MG tablet Take 1 tablet (500 mg total) by mouth 2 (two) times daily. 12/15/18   Petrucelli, Glynda Jaeger, PA-C  Polyethyl Glycol-Propyl Glycol (SYSTANE) 0.4-0.3 % SOLN Place 1 drop into both eyes daily as needed (dry eyes).    [provider]  predniSONE (DELTASONE) 20 MG tablet 3 tabs po daily x 3 days, then 2 tabs x 3 days, then 1.5 tabs x 3 days, then 1 tab x 3 days, then 0.5 tabs x 3 days 01/02/19   Orpah Greek, MD  SUMAtriptan (IMITREX) 100 MG tablet Take 1 tablet earliest onset of migraine.  May repeat x1 in 2 hours if headache persists or recurs.  Do not exceed 2 tablets in 24h 06/11/18   Jaffe, Adam R, DO  tiZANidine (ZANAFLEX) 4 MG tablet Take 1 tablet (4 mg total) by mouth at bedtime. Patient not taking: Reported on 10/12/2018 06/11/18   Pieter Partridge, DO  venlafaxine XR (EFFEXOR-XR) 75 MG 24 hr capsule Take 1 capsule (75 mg total) by mouth daily. For mood control 01/11/19    Arfeen, Arlyce Harman, MD    Family History Family History  Problem Relation Age of Onset  . Hypertension Mother   . Diabetes Mother   . Osteoarthritis Mother   . Hypertension Father   . Cancer Father   . Stroke Father   . Osteoarthritis Father     Social History Social History   Tobacco Use  . Smoking status: Former Research scientist (life sciences)  . Smokeless tobacco: Never Used  Substance Use Topics  . Alcohol use: No  . Drug use: No     Allergies   Morphine and related, Adhesive [tape], and Latex   Review of Systems Review of Systems  HENT: Positive for congestion and sore throat.   Respiratory: Positive for cough, chest tightness and shortness of breath.   All other systems reviewed and are negative.    Physical Exam Updated Vital Signs BP (!) 131/96   Pulse (!) 110   Temp 98.6 F (37 C) (Oral)   Resp 16   Ht 5' 5.5" (1.664 m)   LMP 03/06/2019 Comment: bilateral oophorectomy per chart  SpO2 99%   BMI 28.02 kg/m   Physical Exam Vitals signs and nursing note reviewed.  Constitutional:      General: She is not in acute distress.    Appearance: Normal appearance. She is well-developed.  HENT:     Head: Normocephalic and atraumatic.     Right Ear: Hearing normal.     Left Ear: Hearing normal.     Nose: Nose normal.  Eyes:     Conjunctiva/sclera: Conjunctivae normal.     Pupils: Pupils are equal, round, and reactive to light.  Neck:     Musculoskeletal: Normal range of motion and neck supple.  Cardiovascular:     Rate and Rhythm: Regular rhythm. Tachycardia present.     Heart sounds: S1 normal and S2 normal. No murmur. No friction rub. No gallop.   Pulmonary:     Effort: Pulmonary effort is normal. No respiratory distress.     Breath sounds: Normal breath sounds.  Chest:     Chest wall: No tenderness.  Abdominal:     General: Bowel sounds are normal.     Palpations: Abdomen is soft.     Tenderness: There is no abdominal tenderness. There is no guarding or rebound.  Negative signs include Murphy's sign and McBurney's sign.     Hernia: No hernia is present.  Musculoskeletal: Normal range of motion.  Skin:    General: Skin is warm and dry.     Findings: No rash.  Neurological:  Mental Status: She is alert and oriented to person, place, and time.     GCS: GCS eye subscore is 4. GCS verbal subscore is 5. GCS motor subscore is 6.     Cranial Nerves: No cranial nerve deficit.     Sensory: No sensory deficit.     Coordination: Coordination normal.  Psychiatric:        Speech: Speech normal.        Behavior: Behavior normal.        Thought Content: Thought content normal.      ED Treatments / Results  Labs (all labs ordered are listed, but only abnormal results are displayed) Labs Reviewed  BASIC METABOLIC PANEL - Abnormal; Notable for the following components:      Result Value   CO2 21 (*)    Glucose, Bld 102 (*)    Calcium 8.6 (*)    All other components within normal limits  D-DIMER, QUANTITATIVE (NOT AT [facility]) - Abnormal; Notable for the following components:   D-Dimer, Quant 0.96 (*)    All other components within normal limits  NOVEL CORONAVIRUS, NAA (HOSPITAL ORDER, SEND-OUT TO REF LAB)  CBC WITH DIFFERENTIAL/PLATELET  LACTIC ACID, PLASMA  TROPONIN I (HIGH SENSITIVITY)  TROPONIN I (HIGH SENSITIVITY)    EKG None  Radiology Dg Chest Port 1 View  Result Date: 03/27/2019 CLINICAL DATA:  Cough and sore throat with shortness of breath EXAM: PORTABLE CHEST 1 VIEW COMPARISON:  01/02/2019 FINDINGS: Normal heart size and mediastinal contours. No acute infiltrate or edema. Mild biapical scarring, stable. No effusion or pneumothorax. No acute osseous findings. IMPRESSION: Negative chest. Electronically Signed   By: Monte Fantasia M.D.   On: 03/27/2019 06:38    Procedures Procedures (including critical care time)  Medications Ordered in ED Medications  sodium chloride (PF) 0.9 % injection (has no administration in time range)  sodium  chloride 0.9 % bolus 1,000 mL (1,000 mLs Intravenous New Bag/Given 03/27/19 0527)  acetaminophen (TYLENOL) tablet 1,000 mg (1,000 mg Oral Given 03/27/19 0527)  methylPREDNISolone sodium succinate (SOLU-MEDROL) 125 mg/2 mL injection 125 mg (125 mg Intravenous Given 03/27/19 0529)  albuterol (VENTOLIN HFA) 108 (90 Base) MCG/ACT inhaler 8 puff (8 puffs Inhalation Given 03/27/19 0529)  iohexol (OMNIPAQUE) 350 MG/ML injection 100 mL (100 mLs Intravenous Contrast Given 03/27/19 6761)     Initial Impression / Assessment and Plan / ED Course  I have reviewed the triage vital signs and the nursing notes.  Pertinent labs & imaging results that were available during my care of the patient were reviewed by me and considered in my medical decision making (see chart for details).        Patient presents to the emergency department for evaluation of shortness of breath.  Patient reports COVID-19 contacts at work.  She has not had a fever but has had some cough.  Patient reports that she was sick last week with upper respiratory infection symptoms but then improved.  Over the last 24 hours, however, she has developed a cough and now tightness in her chest and shortness of breath.  She has used albuterol without improvement.  Upon arrival she was examined and had tachycardia, dyspnea but no active wheezing.  Chest x-ray did not show any pneumonia or other abnormality to explain her level of shortness of breath.  D-dimer was elevated and therefore she will undergo CT angiography to evaluate for PE.  If negative she can be discharged with treatment for acute bronchitis.  She is not  hypoxic.  Final Clinical Impressions(s) / ED Diagnoses   Final diagnoses:  Shortness of breath    ED Discharge Orders    None       Pollina, Gwenyth Allegra, MD 03/27/19 [phone]

## 2019-03-28 LAB — NOVEL CORONAVIRUS, NAA (HOSP ORDER, SEND-OUT TO REF LAB; TAT 18-24 HRS): SARS-CoV-2, NAA: NOT DETECTED

## 2019-04-12 ENCOUNTER — Ambulatory Visit (INDEPENDENT_AMBULATORY_CARE_PROVIDER_SITE_OTHER): Payer: 59 | Admitting: Psychiatry

## 2019-04-12 ENCOUNTER — Other Ambulatory Visit: Payer: Self-pay

## 2019-04-12 ENCOUNTER — Encounter (HOSPITAL_COMMUNITY): Payer: Self-pay | Admitting: Psychiatry

## 2019-04-12 DIAGNOSIS — F411 Generalized anxiety disorder: Secondary | ICD-10-CM | POA: Diagnosis not present

## 2019-04-12 DIAGNOSIS — F319 Bipolar disorder, unspecified: Secondary | ICD-10-CM | POA: Diagnosis not present

## 2019-04-12 MED ORDER — BENZTROPINE MESYLATE 0.5 MG PO TABS: 0.5000 mg | ORAL_TABLET | Freq: Every day | ORAL | 0 refills | Status: DC

## 2019-04-12 MED ORDER — ARIPIPRAZOLE 5 MG PO TABS: 5.0000 mg | ORAL_TABLET | Freq: Every day | ORAL | 0 refills | Status: DC

## 2019-04-12 MED ORDER — VENLAFAXINE HCL ER 75 MG PO CP24: 75.0000 mg | ORAL_CAPSULE | Freq: Every day | ORAL | 0 refills | Status: DC
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
## 2019-07-05 IMAGING — MR MR HEAD W/O CM
10 of 11 series · 43 of 48 positions shown · non-contrast
Comparison: CT HEAD June 04, 2018

CLINICAL DATA: Intermittent headache, neck and shoulder pain for 2
weeks. LEFT facial numbness today. Assess TIA.

EXAM:
MRI HEAD WITHOUT CONTRAST
TECHNIQUE: Multiplanar, multiecho pulse sequences of the brain and surrounding
structures were obtained without intravenous contrast.

[Series 5: ax dwi_tracew · axial · 3.0mm · 1.50mm/px · z∈[-42,+91]mm · 8 of 92 slices shown]
[im 1/92]
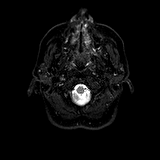
[im 14/92]
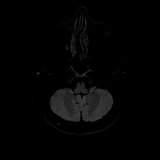
[im 27/92]
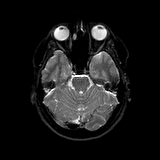
[im 40/92]
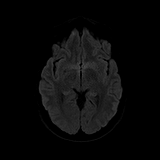
[im 53/92]
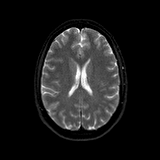
[im 66/92]
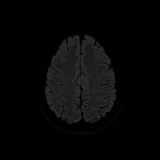
[im 79/92]
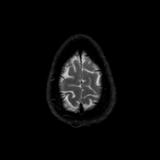
[im 92/92]
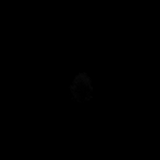

[Series 6: ax dwi_adc · axial · 3.0mm · 1.50mm/px · z∈[-42,+91]mm · 5 of 46 slices shown]
[im 1/46]
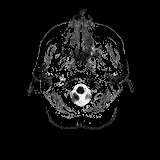
[im 12/46]
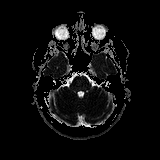
[im 23/46]
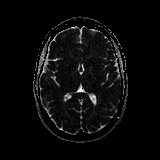
[im 34/46]
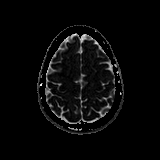
[im 46/46]
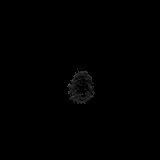

[Series 7: cor dwi_tracew · coronal · 5.0mm · 1.44mm/px · 7 of 68 slices shown]
[im 1/68]
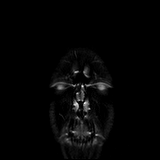
[im 12/68]
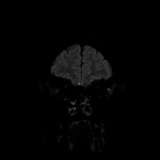
[im 23/68]
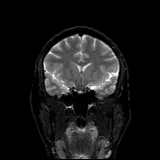
[im 34/68]
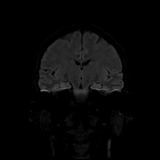
[im 45/68]
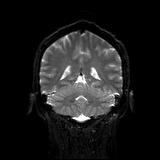
[im 56/68]
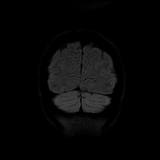
[im 68/68]
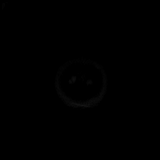

[Series 8: cor dwi_adc · coronal · 5.0mm · 1.44mm/px · 3 of 34 slices shown]
[im 1/34]
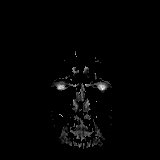
[im 17/34]
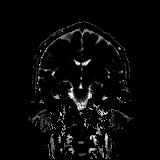
[im 34/34]
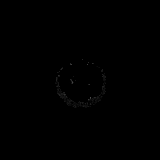

[Series 9: T1 · sagittal · 5.0mm · 0.75mm/px · 2 of 23 slices shown]
[im 1/23]
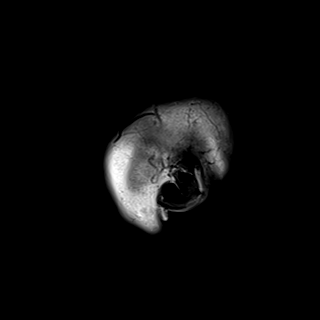
[im 23/23]
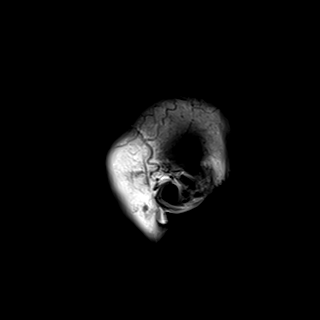

[Series 10: T2 · axial · 5.0mm · 0.72mm/px · z∈[-49,+93]mm · 3 of 25 slices shown (1 of 2)]
[im 1/25]
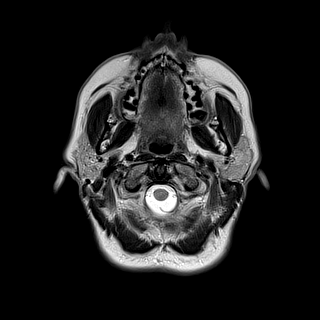
[im 13/25]
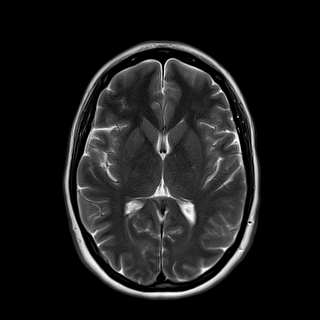
[im 25/25]
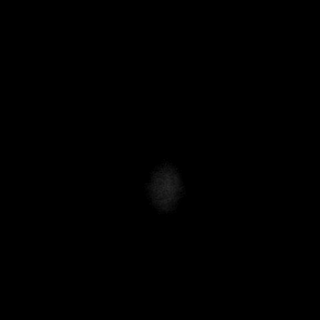

[Series 11: FLAIR · axial · 5.0mm · 0.45mm/px · z∈[-50,+92]mm · 3 of 25 slices shown]
[im 1/25]
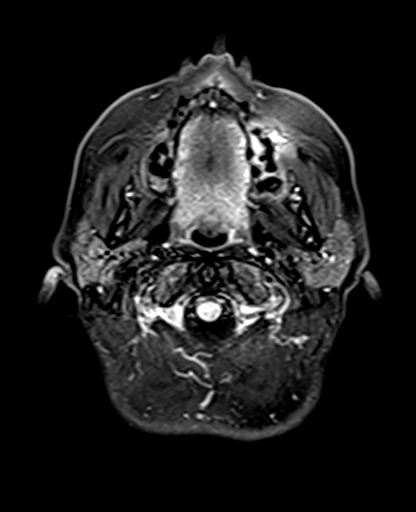
[im 13/25]
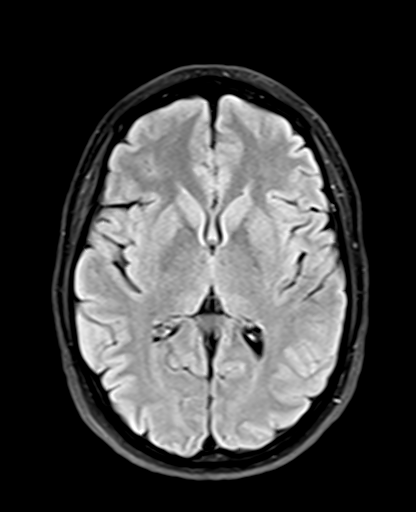
[im 25/25]
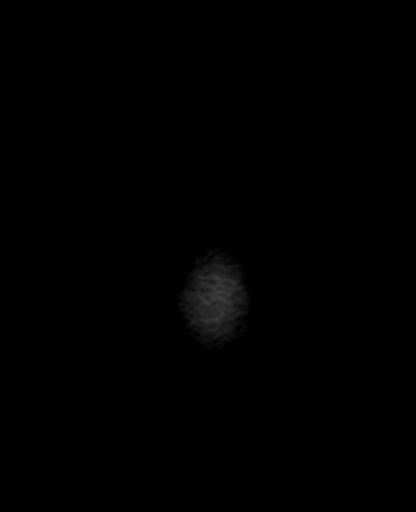

[Series 12: swi_images · axial · 3.0mm · 0.90mm/px · z∈[-47,+91]mm · 5 of 48 slices shown]
[im 1/48]
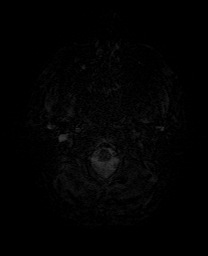
[im 12/48]
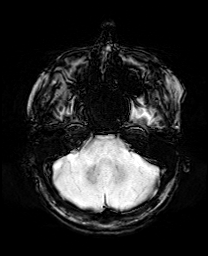
[im 24/48]
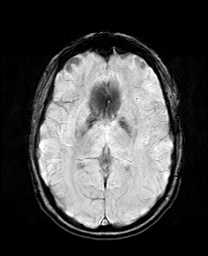
[im 36/48]
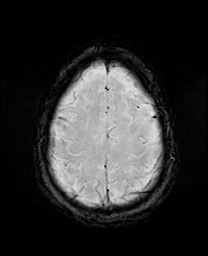
[im 48/48]
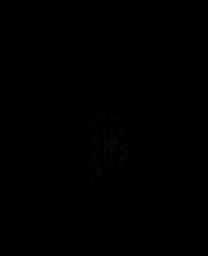

[Series 13: mip_images(sw) · axial · 24.0mm · 0.90mm/px · z∈[-37,+81]mm · 4 of 41 slices shown]
[im 1/41]
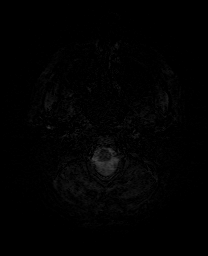
[im 14/41]
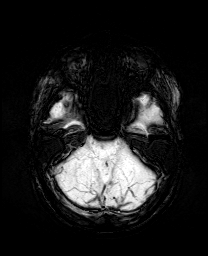
[im 27/41]
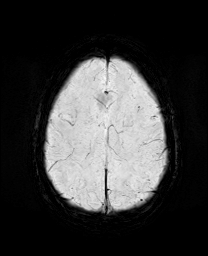
[im 41/41]
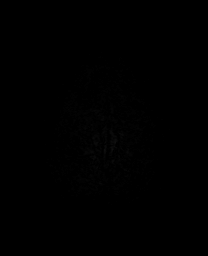

[Series 15: T2 · coronal · 5.0mm · 0.34mm/px · 3 of 29 slices shown (2 of 2)]
[im 1/29]
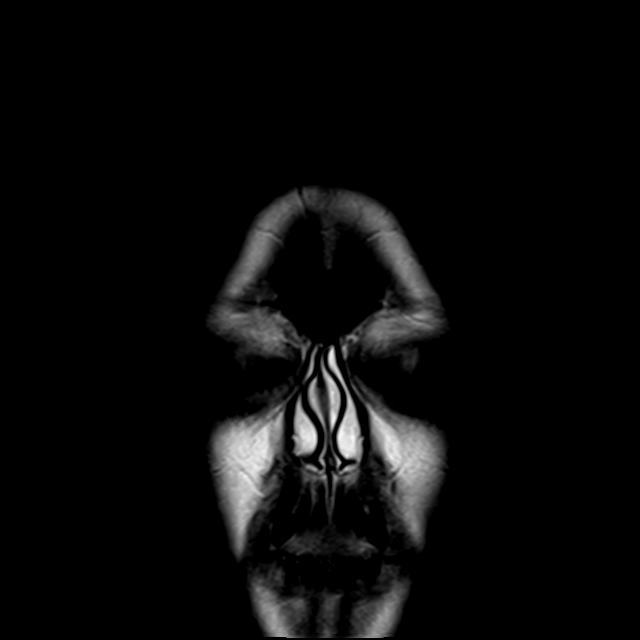
[im 15/29]
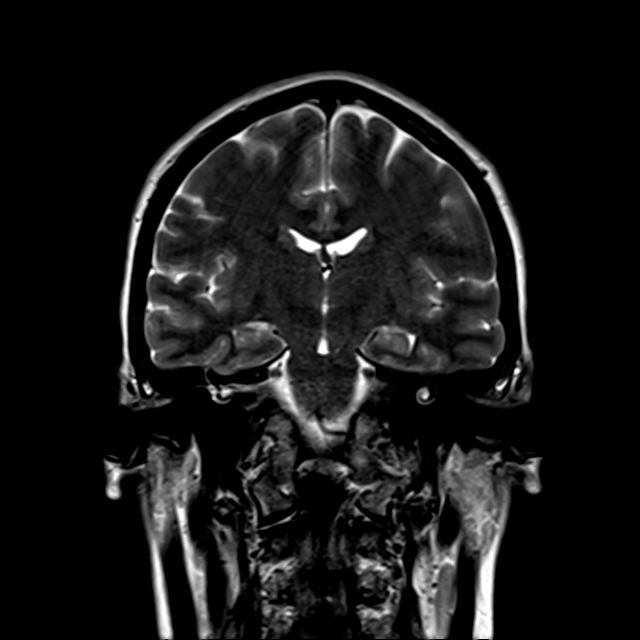
[im 29/29]
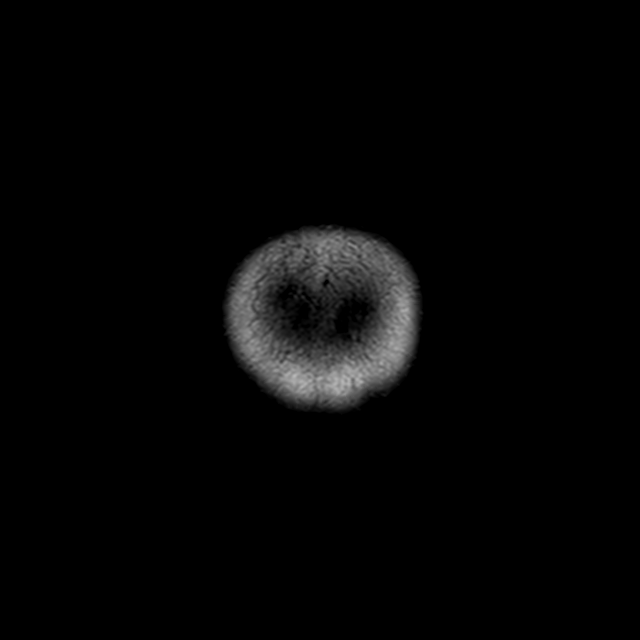

[43 of 48 positions shown; findings below may reference images not displayed]

FINDINGS: INTRACRANIAL CONTENTS: No reduced diffusion to suggest acute
ischemia or hyperacute demyelination. No susceptibility artifact to
suggest hemorrhage. The ventricles and sulci are normal for
patient's age. No suspicious parenchymal signal, masses, mass
effect. No abnormal extra-axial fluid collections. No extra-axial
masses.

VASCULAR: Normal major intracranial vascular flow voids present at
skull base.

SKULL AND UPPER CERVICAL SPINE: No abnormal sellar expansion. No
suspicious calvarial bone marrow signal. Craniocervical junction
maintained.

SINUSES/ORBITS: RIGHT ethmoid mucosal retention cyst. Mastoid air
cells are well aerated. Included ocular globes and orbital contents
are non-suspicious.

OTHER: None.
IMPRESSION: Normal MRI head without contrast.

## 2019-07-15 ENCOUNTER — Ambulatory Visit (INDEPENDENT_AMBULATORY_CARE_PROVIDER_SITE_OTHER): Payer: Managed Care, Other (non HMO) | Admitting: Psychiatry

## 2019-07-15 ENCOUNTER — Encounter (HOSPITAL_COMMUNITY): Payer: Self-pay | Admitting: Psychiatry

## 2019-07-15 ENCOUNTER — Other Ambulatory Visit: Payer: Self-pay

## 2019-07-15 DIAGNOSIS — F319 Bipolar disorder, unspecified: Secondary | ICD-10-CM | POA: Diagnosis not present

## 2019-07-15 DIAGNOSIS — F411 Generalized anxiety disorder: Secondary | ICD-10-CM

## 2019-07-15 MED ORDER — VENLAFAXINE HCL ER 75 MG PO CP24: 75.0000 mg | ORAL_CAPSULE | Freq: Every day | ORAL | 0 refills | Status: DC

## 2019-07-15 MED ORDER — ARIPIPRAZOLE 5 MG PO TABS: 5.0000 mg | ORAL_TABLET | Freq: Every day | ORAL | 0 refills | Status: DC

## 2019-07-15 MED ORDER — HYDROXYZINE HCL 50 MG PO TABS: 50.0000 mg | ORAL_TABLET | Freq: Every evening | ORAL | 0 refills | Status: DC | PRN
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
## 2019-10-16 ENCOUNTER — Other Ambulatory Visit: Payer: Self-pay

## 2019-10-16 ENCOUNTER — Ambulatory Visit (INDEPENDENT_AMBULATORY_CARE_PROVIDER_SITE_OTHER): Payer: 59 | Admitting: Psychiatry

## 2019-10-16 ENCOUNTER — Encounter (HOSPITAL_COMMUNITY): Payer: Self-pay | Admitting: Psychiatry

## 2019-10-16 DIAGNOSIS — F411 Generalized anxiety disorder: Secondary | ICD-10-CM

## 2019-10-16 DIAGNOSIS — F319 Bipolar disorder, unspecified: Secondary | ICD-10-CM

## 2019-10-16 MED ORDER — ARIPIPRAZOLE 5 MG PO TABS: 5.0000 mg | ORAL_TABLET | Freq: Every day | ORAL | 0 refills | Status: DC

## 2019-10-16 MED ORDER — VENLAFAXINE HCL ER 75 MG PO CP24: 75.0000 mg | ORAL_CAPSULE | Freq: Every day | ORAL | 0 refills | Status: DC

## 2019-10-16 MED ORDER — HYDROXYZINE HCL 50 MG PO TABS: 50.0000 mg | ORAL_TABLET | Freq: Every evening | ORAL | 1 refills | Status: DC | PRN
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
## 2020-01-15 ENCOUNTER — Encounter (HOSPITAL_COMMUNITY): Payer: Self-pay | Admitting: Psychiatry

## 2020-01-15 ENCOUNTER — Ambulatory Visit (INDEPENDENT_AMBULATORY_CARE_PROVIDER_SITE_OTHER): Payer: Self-pay | Admitting: Psychiatry

## 2020-01-15 ENCOUNTER — Other Ambulatory Visit: Payer: Self-pay

## 2020-01-15 VITALS — Wt 183.0 lb

## 2020-01-15 DIAGNOSIS — F419 Anxiety disorder, unspecified: Secondary | ICD-10-CM

## 2020-01-15 DIAGNOSIS — F319 Bipolar disorder, unspecified: Secondary | ICD-10-CM

## 2020-01-15 MED ORDER — ARIPIPRAZOLE 5 MG PO TABS: 5.0000 mg | ORAL_TABLET | Freq: Every day | ORAL | 0 refills | Status: DC

## 2020-01-15 MED ORDER — VENLAFAXINE HCL ER 75 MG PO CP24: 75.0000 mg | ORAL_CAPSULE | Freq: Every day | ORAL | 0 refills | Status: DC
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
## 2020-03-16 ENCOUNTER — Emergency Department (HOSPITAL_COMMUNITY)
Admission: EM | Admit: 2020-03-16 | Discharge: 2020-03-16 | Disposition: A | Discharge status: Home / Self Care | Payer: Managed Care, Other (non HMO) | Attending: Emergency Medicine | Admitting: Emergency Medicine

## 2020-03-16 ENCOUNTER — Other Ambulatory Visit: Payer: Self-pay

## 2020-03-16 ENCOUNTER — Encounter (HOSPITAL_COMMUNITY): Payer: Self-pay

## 2020-03-16 DIAGNOSIS — Z886 Allergy status to analgesic agent status: Secondary | ICD-10-CM | POA: Diagnosis not present

## 2020-03-16 DIAGNOSIS — Z87891 Personal history of nicotine dependence: Secondary | ICD-10-CM | POA: Insufficient documentation

## 2020-03-16 DIAGNOSIS — Z79899 Other long term (current) drug therapy: Secondary | ICD-10-CM | POA: Insufficient documentation

## 2020-03-16 DIAGNOSIS — R55 Syncope and collapse: Secondary | ICD-10-CM | POA: Diagnosis not present

## 2020-03-16 DIAGNOSIS — G43809 Other migraine, not intractable, without status migrainosus: Secondary | ICD-10-CM | POA: Insufficient documentation

## 2020-03-16 DIAGNOSIS — Z91040 Latex allergy status: Secondary | ICD-10-CM | POA: Insufficient documentation

## 2020-03-16 DIAGNOSIS — R519 Headache, unspecified: Secondary | ICD-10-CM

## 2020-03-16 LAB — CBG MONITORING, ED: Glucose-Capillary: 91 mg/dL (ref 70–99)

## 2020-03-16 LAB — URINALYSIS, ROUTINE W REFLEX MICROSCOPIC
Bacteria, UA: NONE SEEN
Bilirubin Urine: NEGATIVE
Glucose, UA: NEGATIVE mg/dL
Hgb urine dipstick: NEGATIVE
Ketones, ur: NEGATIVE mg/dL
Nitrite: NEGATIVE
Protein, ur: NEGATIVE mg/dL
Specific Gravity, Urine: 1.009 (ref 1.005–1.030)
pH: 5 (ref 5.0–8.0)

## 2020-03-16 LAB — BASIC METABOLIC PANEL
Anion gap: 13 (ref 5–15)
BUN: 8 mg/dL (ref 6–20)
CO2: 21 mmol/L — ABNORMAL LOW (ref 22–32)
Calcium: 8.7 mg/dL — ABNORMAL LOW (ref 8.9–10.3)
Chloride: 105 mmol/L (ref 98–111)
Creatinine, Ser: 0.64 mg/dL (ref 0.44–1.00)
GFR calc Af Amer: >60 mL/min (ref >60)
GFR calc non Af Amer: >60 mL/min (ref >60)
Glucose, Bld: 104 mg/dL — ABNORMAL HIGH (ref 70–99)
Potassium: 3.9 mmol/L (ref 3.5–5.1)
Sodium: 139 mmol/L (ref 135–145)

## 2020-03-16 LAB — CBC
HCT: 45.3 % (ref 36.0–46.0)
Hemoglobin: 14.5 g/dL (ref 12.0–15.0)
MCH: 27.8 pg (ref 26.0–34.0)
MCHC: 32 g/dL (ref 30.0–36.0)
MCV: 86.9 fL (ref 80.0–100.0)
Platelets: 268 10*3/uL (ref 150–400)
RBC: 5.21 MIL/uL — ABNORMAL HIGH (ref 3.87–5.11)
RDW: 13.5 % (ref 11.5–15.5)
WBC: 9.2 10*3/uL (ref 4.0–10.5)
nRBC: 0 % (ref 0.0–0.2)

## 2020-03-16 MED ORDER — KETOROLAC TROMETHAMINE 30 MG/ML IJ SOLN
30.0000 mg | Freq: Once | INTRAMUSCULAR | Status: AC
  Administered 2020-03-16: 30 mg via INTRAVENOUS
  Filled 2020-03-16: qty 1

## 2020-03-16 MED ORDER — DEXAMETHASONE SODIUM PHOSPHATE 10 MG/ML IJ SOLN
10.0000 mg | Freq: Once | INTRAMUSCULAR | Status: AC
  Administered 2020-03-16: 10 mg via INTRAVENOUS
  Filled 2020-03-16: qty 1

## 2020-03-16 MED ORDER — DIPHENHYDRAMINE HCL 50 MG/ML IJ SOLN
25.0000 mg | Freq: Once | INTRAMUSCULAR | Status: AC
  Administered 2020-03-16: 25 mg via INTRAVENOUS
  Filled 2020-03-16: qty 1

## 2020-03-16 MED ORDER — PROCHLORPERAZINE EDISYLATE 10 MG/2ML IJ SOLN
10.0000 mg | Freq: Once | INTRAMUSCULAR | Status: AC
  Administered 2020-03-16: 10 mg via INTRAVENOUS
  Filled 2020-03-16: qty 2

## 2020-03-16 MED ORDER — SODIUM CHLORIDE 0.9% FLUSH: 3.0000 mL | Freq: Once | INTRAVENOUS | Status: DC

## 2020-03-16 MED ORDER — SODIUM CHLORIDE 0.9 % IV BOLUS
1000.0000 mL | Freq: Once | INTRAVENOUS | Status: AC
  Administered 2020-03-16: 1000 mL via INTRAVENOUS

## 2020-03-16 NOTE — ED Notes (Signed)
ED Provider at bedside. 

## 2020-03-16 NOTE — Discharge Instructions (Addendum)
Your work-up today was overall reassuring and I suspect your symptoms related to the migraine recurrence and your syncopal episode was related to standing up early in the morning.  Please try to rest and stay hydrated and treat the headache at home.  Please follow-up with your primary doctor.  If any symptoms change or worsen, please return to the nearest emergency department.

## 2020-03-16 NOTE — ED Provider Notes (Signed)
[facility] DEPT Provider Note   CSN: [id number] Arrival date & time: 03/16/20  0507     History Chief Complaint  Patient presents with  . Loss of Consciousness    Shannon Kemp is a 44 y.o. female.  The history is provided by the patient and medical records.  Migraine This is a recurrent problem. The current episode started more than 2 days ago. The problem occurs constantly. The problem has not changed since onset.Associated symptoms include headaches. Pertinent negatives include no chest pain, no abdominal pain and no shortness of breath. Nothing (light) aggravates the symptoms. Nothing relieves the symptoms. She has tried nothing for the symptoms. The treatment provided no relief.       Past Medical History:  Diagnosis Date  . Allergy   . Breast pain   . Bruises easily   . Fatigue   . Generalized headaches   . Nipple discharge   . Pneumothorax   . Sleep difficulties   . Weight increase     Patient Active Problem List   Diagnosis Date Noted  . Major depression 07/19/2017  . Bipolar disorder ([place]) 07/18/2017    Past Surgical History:  Procedure Laterality Date  . BILATERAL OOPHORECTOMY    . LAPAROSCOPY  04/01/2012   Procedure: LAPAROSCOPY OPERATIVE;  Surgeon: Claiborne Billings A. Pamala Hurry, MD;  Location: [facility];  Service: Gynecology;  Laterality: N/A;  Operative laparoscopy/right salpingectomy and removal of ectopic pregnancy  . LAPAROSCOPY Left 05/09/2013   Procedure: LAPAROSCOPY OPERATIVE/ECTOPIC;  Surgeon: Linda Hedges, DO;  Location: [facility];  Service: Gynecology;  Laterality: Left;  WITH LEFT SALPINGECTOMY  . ORIF ULNAR FRACTURE    . PLEURAL SCARIFICATION       OB History    Gravida  5   Para  2   Term  0   Preterm  2   AB  2   Living  2     SAB  1   TAB  0   Ectopic  1   Multiple  0   Live Births              Family History  Problem Relation Age of Onset  . Hypertension Mother   . Diabetes Mother   .  Osteoarthritis Mother   . Hypertension Father   . Cancer Father   . Stroke Father   . Osteoarthritis Father     Social History   Tobacco Use  . Smoking status: Former Research scientist (life sciences)  . Smokeless tobacco: Never Used  Vaping Use  . Vaping Use: Never used  Substance Use Topics  . Alcohol use: No  . Drug use: No    Home Medications Prior to Admission medications   Medication Sig Start Date End Date Taking? Authorizing Provider  ARIPiprazole (ABILIFY) 5 MG tablet Take 1 tablet (5 mg total) by mouth daily. 01/15/20 01/14/21  Arfeen, Arlyce Harman, MD  hydrOXYzine (ATARAX/VISTARIL) 50 MG tablet Take 1 tablet (50 mg total) by mouth at bedtime as needed for anxiety. 10/16/19   Arfeen, Arlyce Harman, MD  ibuprofen (ADVIL,MOTRIN) 200 MG tablet Take 800 mg by mouth every 6 (six) hours as needed for headache (leg pain).    [provider]  naproxen (NAPROSYN) 500 MG tablet Take 1 tablet (500 mg total) by mouth 2 (two) times daily. 12/15/18   Petrucelli, Samantha R, PA-C  SUMAtriptan (IMITREX) 100 MG tablet Take 1 tablet earliest onset of migraine.  May repeat x1 in 2 hours if headache persists or  recurs.  Do not exceed 2 tablets in 24h 06/11/18   Tomi Likens, Adam R, DO  venlafaxine XR (EFFEXOR-XR) 75 MG 24 hr capsule Take 1 capsule (75 mg total) by mouth daily. For mood control 01/15/20   Arfeen, Arlyce Harman, MD    Allergies    Morphine and related, Adhesive [tape], and Latex  Review of Systems   Review of Systems  Constitutional: Negative for chills, diaphoresis, fatigue and fever.  HENT: Negative for congestion.   Eyes: Positive for visual disturbance (blurry consistent with prior migraines). Negative for pain.  Respiratory: Negative for cough, chest tightness and shortness of breath.   Cardiovascular: Negative for chest pain, palpitations and leg swelling.  Gastrointestinal: Negative for abdominal pain, constipation, diarrhea, nausea and vomiting.  Genitourinary: Negative for dysuria and frequency.    Musculoskeletal: Negative for back pain, neck pain and neck stiffness.  Neurological: Positive for syncope, light-headedness and headaches. Negative for dizziness and facial asymmetry.  Psychiatric/Behavioral: Negative for agitation and confusion.  All other systems reviewed and are negative.   Physical Exam Updated Vital Signs BP (!) 141/100 (BP Location: Left Arm)   Pulse 94   Temp 98.4 F (36.9 C) (Oral)   Resp 18   Ht 5\' 5"  (1.651 m)   Wt 86.2 kg   LMP 03/06/2019 Comment: bilateral oophorectomy per chart  SpO2 98%   BMI 31.62 kg/m   Physical Exam Vitals and nursing note reviewed.  Constitutional:      General: She is not in acute distress.    Appearance: She is well-developed. She is not ill-appearing, toxic-appearing or diaphoretic.  HENT:     Head: Normocephalic and atraumatic.     Nose: Nose normal. No congestion or rhinorrhea.     Mouth/Throat:     Mouth: Mucous membranes are dry.     Pharynx: No oropharyngeal exudate or posterior oropharyngeal erythema.  Eyes:     Extraocular Movements: Extraocular movements intact.     Conjunctiva/sclera: Conjunctivae normal.     Pupils: Pupils are equal, round, and reactive to light.  Cardiovascular:     Rate and Rhythm: Normal rate and regular rhythm.     Pulses: Normal pulses.     Heart sounds: No murmur heard.   Pulmonary:     Effort: Pulmonary effort is normal. No respiratory distress.     Breath sounds: Normal breath sounds. No stridor. No wheezing, rhonchi or rales.  Chest:     Chest wall: No tenderness.  Abdominal:     General: Abdomen is flat.     Palpations: Abdomen is soft.     Tenderness: There is no abdominal tenderness. There is no right CVA tenderness, left CVA tenderness, guarding or rebound.  Musculoskeletal:        General: No tenderness.     Cervical back: Neck supple. No rigidity or tenderness.  Skin:    General: Skin is warm and dry.  Neurological:     General: No focal deficit present.      Mental Status: She is alert and oriented to person, place, and time. Mental status is at baseline.     Cranial Nerves: No cranial nerve deficit, dysarthria or facial asymmetry.     Sensory: No sensory deficit.     Motor: No weakness, tremor, abnormal muscle tone or seizure activity.     Coordination: Finger-Nose-Finger Test normal.  Psychiatric:        Mood and Affect: Mood normal.     ED Results / Procedures / Treatments  Labs (all labs ordered are listed, but only abnormal results are displayed) Labs Reviewed  BASIC METABOLIC PANEL - Abnormal; Notable for the following components:      Result Value   CO2 21 (*)    Glucose, Bld 104 (*)    Calcium 8.7 (*)    All other components within normal limits  CBC - Abnormal; Notable for the following components:   RBC 5.21 (*)    All other components within normal limits  URINALYSIS, ROUTINE W REFLEX MICROSCOPIC - Abnormal; Notable for the following components:   Leukocytes,Ua SMALL (*)    All other components within normal limits  CBG MONITORING, ED    EKG EKG Interpretation  Date/Time:  Monday March 16 2020 05:29:35 EDT Ventricular Rate:  110 PR Interval:    QRS Duration: 75 QT Interval:  326 QTC Calculation: 441 R Axis:   24 Text Interpretation: Sinus tachycardia Otherwise normal ECG Confirmed by Molpus, John [phone]) on 03/16/2020 5:56:10 AM   Radiology No results found.  Procedures Procedures (including critical care time)  Medications Ordered in ED Medications  sodium chloride 0.9 % bolus 1,000 mL (0 mLs Intravenous Stopped 03/16/20 1002)  prochlorperazine (COMPAZINE) injection 10 mg (10 mg Intravenous Given 03/16/20 0835)  ketorolac (TORADOL) 30 MG/ML injection 30 mg (30 mg Intravenous Given 03/16/20 0835)  diphenhydrAMINE (BENADRYL) injection 25 mg (25 mg Intravenous Given 03/16/20 0833)  dexamethasone (DECADRON) injection 10 mg (10 mg Intravenous Given 03/16/20 8315)    ED Course  I have reviewed the triage vital  signs and the nursing notes.  Pertinent labs & imaging results that were available during my care of the patient were reviewed by me and considered in my medical decision making (see chart for details).    MDM Rules/Calculators/A&P                          Shannon Kemp is a 44 y.o. female with a past medical history significant for chronic migraines who presents with headache and syncopal episode. Patient reports that for the last 5 days, she has been having migrainous type headaches. She reports there is similar to a severe variant she had around a year ago. She says that she has been taking her home medications with have not been successful. She reports she woke up around four in this morning and then got up to get ready to go to work but after standing up and walking in the dining room, she passed out. She reported feeling lightheaded going over her and then got herself to the ground. She denies any palpitations or chest pain. She denies shortness of breath. She reports that other than the headaches, she is not feeling like her normal self. She denies recent medication changes. She does report she is had increase in stress and is scheduled to have a mammogram coming up due to a recurrent lump in her breast. She denies any speech difficulties, nausea, vomiting but does report photophobia. She says that her headache is similar to prior with some burning in the back of her head. She denies any current neck pain or neck stiffness but does report some pain in the back of her head where she thinks she hit her head on the ground. She reports some blurry vision which is consistent with her migraines. She denies any preceding traumas. She denies any palpitations, chest pain, shortness of breath, or extremity symptoms. She denies any fevers, chills, urinary symptoms. She denies  other complaints.  On exam, lungs are clear and chest is nontender. Abdomen is nontender. Back and flanks are nontender. Neck has  normal motion without tenderness. No laceration seen on the back of her head. No focal neurologic deficits. Patient is normal finger-nose-finger testing, pupils are symmetric reactive normal extraocular movements, clear speech. Abdomen was nontender. Patient resting comfortably.  Had a shared decision-making conversation with patient and husband. Patient had some screening labs in triage which were overall reassuring. We agreed to get a headache cocktail and some fluids going to see if her symptoms improve. If her headache resolves and she is feeling well and has normal orthostatics, dissipate she will be stable for discharge home with a likely orthostatic after standing type syncopal episode at home however if symptoms continue, worsen, or change, patient may require imaging. Family would like to wait on imaging and agree with lumbar puncture at this time.  Anticipate reassessment after work-up.  Patient reports her headache is almost completely resolved.  Headache is now a 0-1 out of 10 in severity and the other symptoms are resolved.  She is not feeling lightheaded anymore.  We will check an orthostatic vital sign on her but she is able to p.o. challenge and continues to feel well, dissipate discharge home shortly.  Her labs were done in triage and were overall reassuring with normal sodium, potassium, chloride, and kidney function.  No leukocytosis or anemia.  Urinalysis does not show convincing evidence of UTI.  Anticipate discharge after p.o. challenge and orthostatics.  Patient was reassessed after p.o. challenge and was doing well.  She did have an increase in her heart rate but she reports this always happens and she had no symptoms standing at this time.  She feels much better would like to go home.  Patient will follow up with her PCP and understands return precautions.  She had no other questions or concerns and was discharged in good condition.   Final Clinical Impression(s) / ED  Diagnoses Final diagnoses:  Other migraine without status migrainosus, not intractable  Acute nonintractable headache, unspecified headache type  Syncope, unspecified syncope type    Rx / DC Orders ED Discharge Orders    None      Clinical Impression: 1. Other migraine without status migrainosus, not intractable   2. Acute nonintractable headache, unspecified headache type   3. Syncope, unspecified syncope type     Disposition: Discharge  Condition: Good  I have discussed the results, Dx and Tx plan with the pt(& family if present). He/she/they expressed understanding and agree(s) with the plan. Discharge instructions discussed at great length. Strict return precautions discussed and pt &/or family have verbalized understanding of the instructions. No further questions at time of discharge.    New Prescriptions   No medications on file    Follow Up: Bartholome Bill, MD [place] [id number] [phone]     [facility] [address] [id number] [place]          Parilee Hally, Gwenyth Allegra, MD 03/16/20 1301

## 2020-03-16 NOTE — ED Triage Notes (Signed)
Patient arrived stating she has had a migraine since Thursday and she got up this morning and had a syncopal episode. Reports pressure in head and nausea. Declines any vomiting or light sensitivity at this time.

## 2020-04-23 ENCOUNTER — Telehealth (INDEPENDENT_AMBULATORY_CARE_PROVIDER_SITE_OTHER): Payer: 59 | Admitting: Psychiatry

## 2020-04-23 ENCOUNTER — Other Ambulatory Visit: Payer: Self-pay

## 2020-04-23 ENCOUNTER — Encounter (HOSPITAL_COMMUNITY): Payer: Self-pay | Admitting: Psychiatry

## 2020-04-23 VITALS — Wt 181.0 lb

## 2020-04-23 DIAGNOSIS — F419 Anxiety disorder, unspecified: Secondary | ICD-10-CM

## 2020-04-23 DIAGNOSIS — F319 Bipolar disorder, unspecified: Secondary | ICD-10-CM | POA: Diagnosis not present

## 2020-04-23 MED ORDER — VENLAFAXINE HCL ER 75 MG PO CP24: 75.0000 mg | ORAL_CAPSULE | Freq: Every day | ORAL | 0 refills | Status: DC

## 2020-04-23 MED ORDER — ARIPIPRAZOLE 5 MG PO TABS: 5.0000 mg | ORAL_TABLET | Freq: Every day | ORAL | 0 refills | Status: DC

## 2020-04-23 MED ORDER — HYDROXYZINE HCL 50 MG PO TABS: 50.0000 mg | ORAL_TABLET | Freq: Every evening | ORAL | 0 refills | Status: DC | PRN
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
## 2020-05-18 ENCOUNTER — Emergency Department (HOSPITAL_COMMUNITY): Payer: Managed Care, Other (non HMO)

## 2020-05-18 ENCOUNTER — Encounter (HOSPITAL_COMMUNITY): Payer: Self-pay | Admitting: Emergency Medicine

## 2020-05-18 ENCOUNTER — Emergency Department (HOSPITAL_COMMUNITY)
Admission: EM | Admit: 2020-05-18 | Discharge: 2020-05-18 | Disposition: A | Discharge status: Home / Self Care | Payer: Managed Care, Other (non HMO) | Attending: Emergency Medicine | Admitting: Emergency Medicine

## 2020-05-18 ENCOUNTER — Other Ambulatory Visit: Payer: Self-pay

## 2020-05-18 DIAGNOSIS — Y999 Unspecified external cause status: Secondary | ICD-10-CM | POA: Diagnosis not present

## 2020-05-18 DIAGNOSIS — S199XXA Unspecified injury of neck, initial encounter: Secondary | ICD-10-CM | POA: Diagnosis present

## 2020-05-18 DIAGNOSIS — Z87891 Personal history of nicotine dependence: Secondary | ICD-10-CM | POA: Insufficient documentation

## 2020-05-18 DIAGNOSIS — S161XXA Strain of muscle, fascia and tendon at neck level, initial encounter: Secondary | ICD-10-CM

## 2020-05-18 DIAGNOSIS — Z79899 Other long term (current) drug therapy: Secondary | ICD-10-CM | POA: Diagnosis not present

## 2020-05-18 DIAGNOSIS — Y929 Unspecified place or not applicable: Secondary | ICD-10-CM | POA: Insufficient documentation

## 2020-05-18 DIAGNOSIS — Y939 Activity, unspecified: Secondary | ICD-10-CM | POA: Insufficient documentation

## 2020-05-18 DIAGNOSIS — Z91040 Latex allergy status: Secondary | ICD-10-CM | POA: Insufficient documentation

## 2020-05-18 LAB — POC URINE PREG, ED: Preg Test, Ur: NEGATIVE

## 2020-05-18 MED ORDER — NAPROXEN 375 MG PO TABS
375.0000 mg | ORAL_TABLET | Freq: Two times a day (BID) | ORAL | 0 refills | Status: DC
Start: 2020-05-18 — End: 2020-11-30

## 2020-05-18 MED ORDER — CYCLOBENZAPRINE HCL 10 MG PO TABS
10.0000 mg | ORAL_TABLET | Freq: Two times a day (BID) | ORAL | 0 refills | Status: DC | PRN
Start: 2020-05-18 — End: 2020-11-30
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
## 2020-05-18 NOTE — Discharge Instructions (Signed)
Take the medications as needed for pain.  Follow-up with your primary care doctor will consider seeing a chiropractor if you have persistent symptoms.
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
## 2020-05-18 NOTE — ED Notes (Signed)
Patient transported to X-ray 

## 2020-07-24 ENCOUNTER — Other Ambulatory Visit: Payer: Self-pay

## 2020-07-24 ENCOUNTER — Encounter (HOSPITAL_COMMUNITY): Payer: Self-pay | Admitting: Psychiatry

## 2020-07-24 ENCOUNTER — Telehealth (INDEPENDENT_AMBULATORY_CARE_PROVIDER_SITE_OTHER): Payer: 59 | Admitting: Psychiatry

## 2020-07-24 VITALS — Wt 181.0 lb

## 2020-07-24 DIAGNOSIS — F319 Bipolar disorder, unspecified: Secondary | ICD-10-CM | POA: Diagnosis not present

## 2020-07-24 DIAGNOSIS — F419 Anxiety disorder, unspecified: Secondary | ICD-10-CM | POA: Diagnosis not present

## 2020-07-24 MED ORDER — ARIPIPRAZOLE 5 MG PO TABS: 5.0000 mg | ORAL_TABLET | Freq: Every day | ORAL | 0 refills | Status: DC

## 2020-07-24 MED ORDER — VENLAFAXINE HCL ER 75 MG PO CP24: 75.0000 mg | ORAL_CAPSULE | Freq: Every day | ORAL | 0 refills | Status: DC
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
## 2020-10-16 ENCOUNTER — Other Ambulatory Visit: Payer: Self-pay

## 2020-10-16 ENCOUNTER — Encounter (HOSPITAL_COMMUNITY): Payer: Self-pay | Admitting: Psychiatry

## 2020-10-16 ENCOUNTER — Telehealth (INDEPENDENT_AMBULATORY_CARE_PROVIDER_SITE_OTHER): Payer: 59 | Admitting: Psychiatry

## 2020-10-16 DIAGNOSIS — F419 Anxiety disorder, unspecified: Secondary | ICD-10-CM | POA: Diagnosis not present

## 2020-10-16 DIAGNOSIS — F319 Bipolar disorder, unspecified: Secondary | ICD-10-CM | POA: Diagnosis not present

## 2020-10-16 MED ORDER — ARIPIPRAZOLE 5 MG PO TABS: 5.0000 mg | ORAL_TABLET | Freq: Every day | ORAL | 0 refills | Status: DC

## 2020-10-16 MED ORDER — VENLAFAXINE HCL ER 75 MG PO CP24: 75.0000 mg | ORAL_CAPSULE | Freq: Every day | ORAL | 0 refills | Status: DC
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
## 2020-10-21 ENCOUNTER — Telehealth (HOSPITAL_COMMUNITY): Payer: 59 | Admitting: Psychiatry

## 2020-11-24 ENCOUNTER — Emergency Department (HOSPITAL_BASED_OUTPATIENT_CLINIC_OR_DEPARTMENT_OTHER): Payer: Managed Care, Other (non HMO)

## 2020-11-24 ENCOUNTER — Other Ambulatory Visit: Payer: Self-pay

## 2020-11-24 ENCOUNTER — Emergency Department (HOSPITAL_BASED_OUTPATIENT_CLINIC_OR_DEPARTMENT_OTHER)
Admission: EM | Admit: 2020-11-24 | Discharge: 2020-11-24 | Disposition: A | Discharge status: Home / Self Care | Payer: Managed Care, Other (non HMO) | Attending: Emergency Medicine | Admitting: Emergency Medicine

## 2020-11-24 ENCOUNTER — Encounter (HOSPITAL_BASED_OUTPATIENT_CLINIC_OR_DEPARTMENT_OTHER): Payer: Self-pay | Admitting: Emergency Medicine

## 2020-11-24 DIAGNOSIS — R112 Nausea with vomiting, unspecified: Secondary | ICD-10-CM | POA: Insufficient documentation

## 2020-11-24 DIAGNOSIS — Z91040 Latex allergy status: Secondary | ICD-10-CM | POA: Insufficient documentation

## 2020-11-24 DIAGNOSIS — R1084 Generalized abdominal pain: Secondary | ICD-10-CM | POA: Insufficient documentation

## 2020-11-24 DIAGNOSIS — Z87891 Personal history of nicotine dependence: Secondary | ICD-10-CM | POA: Insufficient documentation

## 2020-11-24 LAB — URINALYSIS, ROUTINE W REFLEX MICROSCOPIC
Bilirubin Urine: NEGATIVE
Glucose, UA: NEGATIVE mg/dL
Hgb urine dipstick: NEGATIVE
Ketones, ur: NEGATIVE mg/dL
Leukocytes,Ua: NEGATIVE
Nitrite: NEGATIVE
Protein, ur: NEGATIVE mg/dL
Specific Gravity, Urine: 1.03 (ref 1.005–1.030)
pH: 5 (ref 5.0–8.0)

## 2020-11-24 LAB — CBC WITH DIFFERENTIAL/PLATELET
Abs Immature Granulocytes: 0.02 10*3/uL (ref 0.00–0.07)
Basophils Absolute: 0.1 10*3/uL (ref 0.0–0.1)
Basophils Relative: 1 %
Eosinophils Absolute: 0.2 10*3/uL (ref 0.0–0.5)
Eosinophils Relative: 3 %
HCT: 40.7 % (ref 36.0–46.0)
Hemoglobin: 13.7 g/dL (ref 12.0–15.0)
Immature Granulocytes: 0 %
Lymphocytes Relative: 27 %
Lymphs Abs: 2.3 10*3/uL (ref 0.7–4.0)
MCH: 28.9 pg (ref 26.0–34.0)
MCHC: 33.7 g/dL (ref 30.0–36.0)
MCV: 85.9 fL (ref 80.0–100.0)
Monocytes Absolute: 0.7 10*3/uL (ref 0.1–1.0)
Monocytes Relative: 8 %
Neutro Abs: 5.2 10*3/uL (ref 1.7–7.7)
Neutrophils Relative %: 61 %
Platelets: 264 10*3/uL (ref 150–400)
RBC: 4.74 MIL/uL (ref 3.87–5.11)
RDW: 13.9 % (ref 11.5–15.5)
WBC: 8.5 10*3/uL (ref 4.0–10.5)
nRBC: 0 % (ref 0.0–0.2)

## 2020-11-24 LAB — COMPREHENSIVE METABOLIC PANEL
ALT: 13 U/L (ref 0–44)
AST: 14 U/L — ABNORMAL LOW (ref 15–41)
Albumin: 3.9 g/dL (ref 3.5–5.0)
Alkaline Phosphatase: 48 U/L (ref 38–126)
Anion gap: 9 (ref 5–15)
BUN: 13 mg/dL (ref 6–20)
CO2: 20 mmol/L — ABNORMAL LOW (ref 22–32)
Calcium: 8.5 mg/dL — ABNORMAL LOW (ref 8.9–10.3)
Chloride: 109 mmol/L (ref 98–111)
Creatinine, Ser: 0.58 mg/dL (ref 0.44–1.00)
GFR, Estimated: >60 mL/min (ref >60)
Glucose, Bld: 98 mg/dL (ref 70–99)
Potassium: 4.3 mmol/L (ref 3.5–5.1)
Sodium: 138 mmol/L (ref 135–145)
Total Bilirubin: 0.7 mg/dL (ref 0.3–1.2)
Total Protein: 6.7 g/dL (ref 6.5–8.1)

## 2020-11-24 LAB — LIPASE, BLOOD: Lipase: 35 U/L (ref 11–51)

## 2020-11-24 MED ORDER — IOHEXOL 300 MG/ML  SOLN
100.0000 mL | Freq: Once | INTRAMUSCULAR | Status: AC | PRN
  Administered 2020-11-24: 100 mL via INTRAVENOUS

## 2020-11-24 MED ORDER — ONDANSETRON HCL 4 MG/2ML IJ SOLN
4.0000 mg | Freq: Once | INTRAMUSCULAR | Status: AC
  Administered 2020-11-24: 4 mg via INTRAVENOUS
  Filled 2020-11-24: qty 2

## 2020-11-24 MED ORDER — LACTATED RINGERS IV BOLUS
1000.0000 mL | Freq: Once | INTRAVENOUS | Status: AC
  Administered 2020-11-24: 1000 mL via INTRAVENOUS

## 2020-11-24 MED ORDER — ONDANSETRON 4 MG PO TBDP: 4.0000 mg | ORAL_TABLET | Freq: Three times a day (TID) | ORAL | 0 refills | Status: AC | PRN

## 2020-11-24 MED ORDER — PANTOPRAZOLE SODIUM 40 MG IV SOLR
40.0000 mg | Freq: Once | INTRAVENOUS | Status: AC
  Administered 2020-11-24: 40 mg via INTRAVENOUS
  Filled 2020-11-24: qty 40

## 2020-11-24 MED ORDER — PANTOPRAZOLE SODIUM 40 MG PO TBEC
40.0000 mg | DELAYED_RELEASE_TABLET | Freq: Every day | ORAL | 0 refills | Status: DC
Start: 2020-11-24 — End: 2022-09-05

## 2020-11-24 MED ORDER — DICYCLOMINE HCL 20 MG PO TABS
20.0000 mg | ORAL_TABLET | Freq: Three times a day (TID) | ORAL | 0 refills | Status: DC | PRN
Start: 2020-11-24 — End: 2021-12-20

## 2020-11-24 NOTE — ED Notes (Signed)
Pt at CT will get vitals when Pt returns

## 2020-11-24 NOTE — ED Provider Notes (Signed)
[facility] Provider Note: Georgena Spurling, MD, FACEP  CSN: [id number] MRN: [id number] ARRIVAL: 11/24/20 at [facility] [place]: [place]  Abdominal Pain   HISTORY OF PRESENT ILLNESS  11/24/20 5:50 AM Shannon Kemp is a 45 y.o. female who has had several months of abdominal pain.  The pain is intermittent and located primarily in her upper abdomen.  She describes it as having dull components as well as a menstrual cramp-like component (but not in the location of menstrual cramps).  It is worse after eating certain foods, notably red meat or dairy products.  Yesterday she had some coughing which she believes had blood in it.  This was followed by vomiting which also had blood in it.  She has not had associated diarrhea and her abdominal pain is mild presently, rated as a 2 out of 10 although it was a 6 out of 10 earlier.  She has not had a fever or shortness of breath.   Past Medical History:  Diagnosis Date  . Allergy   . Breast pain   . Bruises easily   . Fatigue   . Generalized headaches   . Nipple discharge   . Pneumothorax   . Sleep difficulties   . Weight increase     Past Surgical History:  Procedure Laterality Date  . BILATERAL OOPHORECTOMY    . LAPAROSCOPY  04/01/2012   Procedure: LAPAROSCOPY OPERATIVE;  Surgeon: Claiborne Billings A. Pamala Hurry, MD;  Location: [facility];  Service: Gynecology;  Laterality: N/A;  Operative laparoscopy/right salpingectomy and removal of ectopic pregnancy  . LAPAROSCOPY Left 05/09/2013   Procedure: LAPAROSCOPY OPERATIVE/ECTOPIC;  Surgeon: Linda Hedges, DO;  Location: [facility];  Service: Gynecology;  Laterality: Left;  WITH LEFT SALPINGECTOMY  . ORIF ULNAR FRACTURE    . PLEURAL SCARIFICATION      Family History  Problem Relation Age of Onset  . Hypertension Mother   . Diabetes Mother   . Osteoarthritis Mother   . Hypertension Father   . Cancer Father   . Stroke Father   . Osteoarthritis Father     Social History    Tobacco Use  . Smoking status: Former Research scientist (life sciences)  . Smokeless tobacco: Never Used  Vaping Use  . Vaping Use: Never used  Substance Use Topics  . Alcohol use: No  . Drug use: No    Prior to Admission medications   Medication Sig Start Date End Date Taking? Authorizing Provider  dicyclomine (BENTYL) 20 MG tablet Take 1 tablet (20 mg total) by mouth 3 (three) times daily as needed. 11/24/20  Yes Long, Wonda Olds, MD  ondansetron (ZOFRAN ODT) 4 MG disintegrating tablet Take 1 tablet (4 mg total) by mouth every 8 (eight) hours as needed. 11/24/20  Yes Long, Wonda Olds, MD  pantoprazole (PROTONIX) 40 MG tablet Take 1 tablet (40 mg total) by mouth daily. 11/24/20 12/24/20 Yes Long, Wonda Olds, MD  ARIPiprazole (ABILIFY) 5 MG tablet Take 1 tablet (5 mg total) by mouth daily. 10/16/20 10/16/21  Arfeen, Arlyce Harman, MD  cyclobenzaprine (FLEXERIL) 10 MG tablet Take 1 tablet (10 mg total) by mouth 2 (two) times daily as needed for muscle spasms. 05/18/20   Dorie Rank, MD  hydrOXYzine (ATARAX/VISTARIL) 50 MG tablet Take 1 tablet (50 mg total) by mouth at bedtime as needed for anxiety. 04/23/20   Arfeen, Arlyce Harman, MD  ibuprofen (ADVIL,MOTRIN) 200 MG tablet Take 800 mg by mouth every 6 (six) hours as needed for headache (leg pain).  [provider]  Multiple Vitamin (MULTIVITAMIN WITH MINERALS) TABS tablet Take 1 tablet by mouth daily.    [provider]  naproxen (NAPROSYN) 375 MG tablet Take 1 tablet (375 mg total) by mouth 2 (two) times daily. 05/18/20   Dorie Rank, MD  SUMAtriptan (IMITREX) 100 MG tablet Take 1 tablet earliest onset of migraine.  May repeat x1 in 2 hours if headache persists or recurs.  Do not exceed 2 tablets in 24h 06/11/18   Tomi Likens, Adam R, DO  venlafaxine XR (EFFEXOR-XR) 75 MG 24 hr capsule Take 1 capsule (75 mg total) by mouth daily. For mood control 10/16/20   Arfeen, Arlyce Harman, MD    Allergies Morphine and related, Adhesive [tape], and Latex   REVIEW OF SYSTEMS  Negative except as  noted here or in the History of Present Illness.   PHYSICAL EXAMINATION  Initial Vital Signs Blood pressure (!) 122/96, pulse 90, temperature 98.6 F (37 C), temperature source Oral, resp. rate 14, height 5\' 5"  (1.651 m), weight 77.6 kg, last menstrual period 05/17/2020, SpO2 97 %.  Examination General: Well-developed, well-nourished female in no acute distress; appearance consistent with age of record HENT: normocephalic; atraumatic Eyes: pupils equal, round and reactive to light; extraocular muscles intact Neck: supple Heart: regular rate and rhythm Lungs: clear to auscultation bilaterally Abdomen: soft; nondistended; upper abdominal tenderness; no masses or hepatosplenomegaly; bowel sounds present Extremities: No deformity; full range of motion; pulses normal Neurologic: Awake, alert and oriented; motor function intact in all extremities and symmetric; no facial droop Skin: Warm and dry Psychiatric: Normal mood and affect   RESULTS  Summary of this visit's results, reviewed and interpreted by myself:   EKG Interpretation  Date/Time:    Ventricular Rate:    PR Interval:    QRS Duration:   QT Interval:    QTC Calculation:   R Axis:     Text Interpretation:        Laboratory Studies: No results found for this or any previous visit (from the past 24 hour(s)). Imaging Studies: No results found.  ED COURSE and MDM  Nursing notes, initial and subsequent vitals signs, including pulse oximetry, reviewed and interpreted by myself.  Vitals:   11/24/20 0545 11/24/20 0548 11/24/20 0735  BP:  (!) 122/96 (!) 132/101  Pulse:  90 94  Resp:  14 16  Temp:  98.6 F (37 C)   TempSrc:  Oral   SpO2:  97% 100%  Weight: 77.6 kg    Height: 5\' 5"  (1.651 m)     Medications  lactated ringers bolus 1,000 mL ( Intravenous Stopped 11/24/20 0803)  ondansetron (ZOFRAN) injection 4 mg (4 mg Intravenous Given 11/24/20 0625)  pantoprazole (PROTONIX) injection 40 mg (40 mg Intravenous Given  11/24/20 0623)  iohexol (OMNIPAQUE) 300 MG/ML solution 100 mL (100 mLs Intravenous Contrast Given 11/24/20 0722)   7:00 AM Signed out to Dr. Laverta Baltimore. CT and labs pending.   PROCEDURES  Procedures   ED DIAGNOSES     ICD-10-CM   1. Generalized abdominal pain  R10.84   2. Non-intractable vomiting with nausea, unspecified vomiting type  R11.2        Shanon Rosser, MD 11/26/20 1227

## 2020-11-24 NOTE — Discharge Instructions (Signed)

## 2020-11-24 NOTE — ED Notes (Signed)
Patient transported to CT 

## 2020-11-24 NOTE — ED Notes (Signed)
Pt discharged to home. Discharge instructions have been discussed with patient and/or family members. Pt verbally acknowledges understanding d/c instructions, and endorses comprehension to checkout at registration before leaving.  °

## 2020-11-24 NOTE — ED Provider Notes (Signed)
Blood pressure (!) 122/96, pulse 90, temperature 98.6 F (37 C), temperature source Oral, resp. rate 14, height 5\' 5"  (1.651 m), weight 77.6 kg, last menstrual period 05/17/2020, SpO2 97 %.  Assuming care from Dr. Florina Ou.  In short, Shannon Kemp is a 45 y.o. female with a chief complaint of Abdominal Pain .  Refer to the original H&P for additional details.  The current plan of care is to f/u on labs and CT/CXR.  08:00 AM  CT imaging and chest x-ray reviewed with no acute findings to explain patient's symptoms. Patient is not anemic.  She is not continuing to have vomiting.  She notes years of similar symptoms although this is the worst episode.  She has been trying to see a gastroenterologist but has had difficulty getting time off of work to go.  Her PCP has provided a referral and does not need one from the emergency department. No URI symptoms to suspect COVID. Will provide work note and supportive care meds.     Margette Fast, MD 11/24/20 [phone]

## 2020-11-24 NOTE — ED Triage Notes (Addendum)
Pt states yesterday she started having abd pain and  was vomiting and it had blood in it  States the blood was bright red  Pt states tonight she has been coughing and it continues to have blood in it  Pt states her abd pain continues as well but has not had any tonight

## 2020-11-28 ENCOUNTER — Emergency Department (HOSPITAL_COMMUNITY): Payer: Managed Care, Other (non HMO)

## 2020-11-28 ENCOUNTER — Other Ambulatory Visit: Payer: Self-pay

## 2020-11-28 ENCOUNTER — Emergency Department (HOSPITAL_COMMUNITY)
Admission: EM | Admit: 2020-11-28 | Discharge: 2020-11-28 | Disposition: A | Discharge status: Home / Self Care | Payer: Managed Care, Other (non HMO) | Attending: Emergency Medicine | Admitting: Emergency Medicine

## 2020-11-28 ENCOUNTER — Encounter (HOSPITAL_COMMUNITY): Payer: Self-pay

## 2020-11-28 DIAGNOSIS — R059 Cough, unspecified: Secondary | ICD-10-CM | POA: Insufficient documentation

## 2020-11-28 DIAGNOSIS — R109 Unspecified abdominal pain: Secondary | ICD-10-CM

## 2020-11-28 DIAGNOSIS — N390 Urinary tract infection, site not specified: Secondary | ICD-10-CM

## 2020-11-28 DIAGNOSIS — Z87891 Personal history of nicotine dependence: Secondary | ICD-10-CM | POA: Insufficient documentation

## 2020-11-28 DIAGNOSIS — K2900 Acute gastritis without bleeding: Secondary | ICD-10-CM | POA: Diagnosis not present

## 2020-11-28 DIAGNOSIS — Z20822 Contact with and (suspected) exposure to covid-19: Secondary | ICD-10-CM | POA: Insufficient documentation

## 2020-11-28 DIAGNOSIS — M549 Dorsalgia, unspecified: Secondary | ICD-10-CM | POA: Diagnosis not present

## 2020-11-28 DIAGNOSIS — Z91040 Latex allergy status: Secondary | ICD-10-CM | POA: Diagnosis not present

## 2020-11-28 DIAGNOSIS — Z79899 Other long term (current) drug therapy: Secondary | ICD-10-CM | POA: Insufficient documentation

## 2020-11-28 DIAGNOSIS — R079 Chest pain, unspecified: Secondary | ICD-10-CM | POA: Insufficient documentation

## 2020-11-28 DIAGNOSIS — R1013 Epigastric pain: Secondary | ICD-10-CM | POA: Diagnosis present

## 2020-11-28 LAB — COMPREHENSIVE METABOLIC PANEL
ALT: 12 U/L (ref 0–44)
AST: 13 U/L — ABNORMAL LOW (ref 15–41)
Albumin: 4.1 g/dL (ref 3.5–5.0)
Alkaline Phosphatase: 60 U/L (ref 38–126)
Anion gap: 9 (ref 5–15)
BUN: 8 mg/dL (ref 6–20)
CO2: 25 mmol/L (ref 22–32)
Calcium: 8.8 mg/dL — ABNORMAL LOW (ref 8.9–10.3)
Chloride: 106 mmol/L (ref 98–111)
Creatinine, Ser: 0.68 mg/dL (ref 0.44–1.00)
GFR, Estimated: >60 mL/min (ref >60)
Glucose, Bld: 85 mg/dL (ref 70–99)
Potassium: 4.1 mmol/L (ref 3.5–5.1)
Sodium: 140 mmol/L (ref 135–145)
Total Bilirubin: 0.4 mg/dL (ref 0.3–1.2)
Total Protein: 7.3 g/dL (ref 6.5–8.1)

## 2020-11-28 LAB — URINALYSIS, ROUTINE W REFLEX MICROSCOPIC
Bilirubin Urine: NEGATIVE
Glucose, UA: NEGATIVE mg/dL
Hgb urine dipstick: NEGATIVE
Ketones, ur: NEGATIVE mg/dL
Nitrite: NEGATIVE
Protein, ur: NEGATIVE mg/dL
Specific Gravity, Urine: 1.018 (ref 1.005–1.030)
pH: 7 (ref 5.0–8.0)

## 2020-11-28 LAB — I-STAT BETA HCG BLOOD, ED (MC, WL, AP ONLY): I-stat hCG, quantitative: <5 m[IU]/mL (ref <5)

## 2020-11-28 LAB — CBC
HCT: 44.5 % (ref 36.0–46.0)
Hemoglobin: 14.5 g/dL (ref 12.0–15.0)
MCH: 28.3 pg (ref 26.0–34.0)
MCHC: 32.6 g/dL (ref 30.0–36.0)
MCV: 86.7 fL (ref 80.0–100.0)
Platelets: 254 10*3/uL (ref 150–400)
RBC: 5.13 MIL/uL — ABNORMAL HIGH (ref 3.87–5.11)
RDW: 13.8 % (ref 11.5–15.5)
WBC: 7.8 10*3/uL (ref 4.0–10.5)
nRBC: 0 % (ref 0.0–0.2)

## 2020-11-28 LAB — LIPASE, BLOOD: Lipase: 37 U/L (ref 11–51)

## 2020-11-28 LAB — TROPONIN I (HIGH SENSITIVITY)
Troponin I (High Sensitivity): <2 ng/L (ref <18)
Troponin I (High Sensitivity): <2 ng/L (ref <18)

## 2020-11-28 MED ORDER — CEPHALEXIN 500 MG PO CAPS: 500.0000 mg | ORAL_CAPSULE | Freq: Three times a day (TID) | ORAL | 0 refills | Status: AC

## 2020-11-28 MED ORDER — IOHEXOL 350 MG/ML SOLN
100.0000 mL | Freq: Once | INTRAVENOUS | Status: AC | PRN
  Administered 2020-11-28: 100 mL via INTRAVENOUS

## 2020-11-28 MED ORDER — SODIUM CHLORIDE 0.9 % IV BOLUS
1000.0000 mL | Freq: Once | INTRAVENOUS | Status: AC
  Administered 2020-11-28: 1000 mL via INTRAVENOUS

## 2020-11-28 NOTE — ED Triage Notes (Signed)
Pt reports lower abdominal pain that began Monday and also reports vomiting blood. Pt states he pain spread to her lower back then upper back and now chest over the past few days. Pt also reports diarrhea.

## 2020-11-28 NOTE — ED Provider Notes (Signed)
[facility] DEPT Provider Note   CSN: [id number] Arrival date & time: 11/28/20  1117     History Chief Complaint  Patient presents with  . Abdominal Pain  . Chest Pain    Shannon Kemp is a 45 y.o. female.  HPI      45yo female with history of bipolar, depression, recent emergency department 4 days ago for abdominal pain, nausea and vomiting, presents with concern for continuing abdominal pain, nausea and vomiting, and development of left-sided chest and back pain.  Reports 5 days ago, she developed a severe cough, with nausea and vomiting.  Reports that she was coughing to the point of vomiting.  Reports coughing up some mucus mixed with small amount of bright red blood, as well as vomiting up emesis with streaks of bright red blood.  Reported epigastric abdominal pain.  Reports diarrhea, 0-5 times per day, and describes stool as "red clay'.  Denies black tarry stools or gross blood.  Last night, she developed stabbing left upper chest pain with radiation to the shoulder and left arm, as well as radiation to back between the shoulder blades.  The chest pain is worse when she leans all the way back, and also worse if she leans all the way forward.  Reports some associated shortness of breath.  Denies fevers, known exposures to COVID-19 but she does work with the public.  Past Medical History:  Diagnosis Date  . Allergy   . Breast pain   . Bruises easily   . Fatigue   . Generalized headaches   . Nipple discharge   . Pneumothorax   . Sleep difficulties   . Weight increase     Patient Active Problem List   Diagnosis Date Noted  . Major depression 07/19/2017  . Bipolar disorder ([place]) 07/18/2017    Past Surgical History:  Procedure Laterality Date  . BILATERAL OOPHORECTOMY    . LAPAROSCOPY  04/01/2012   Procedure: LAPAROSCOPY OPERATIVE;  Surgeon: Claiborne Billings A. Pamala Hurry, MD;  Location: [facility];  Service: Gynecology;  Laterality: N/A;  Operative  laparoscopy/right salpingectomy and removal of ectopic pregnancy  . LAPAROSCOPY Left 05/09/2013   Procedure: LAPAROSCOPY OPERATIVE/ECTOPIC;  Surgeon: Linda Hedges, DO;  Location: [facility];  Service: Gynecology;  Laterality: Left;  WITH LEFT SALPINGECTOMY  . ORIF ULNAR FRACTURE    . PLEURAL SCARIFICATION       OB History    Gravida  5   Para  2   Term  0   Preterm  2   AB  2   Living  2     SAB  1   IAB  0   Ectopic  1   Multiple  0   Live Births              Family History  Problem Relation Age of Onset  . Hypertension Mother   . Diabetes Mother   . Osteoarthritis Mother   . Hypertension Father   . Cancer Father   . Stroke Father   . Osteoarthritis Father     Social History   Tobacco Use  . Smoking status: Former Research scientist (life sciences)  . Smokeless tobacco: Never Used  Vaping Use  . Vaping Use: Never used  Substance Use Topics  . Alcohol use: No  . Drug use: No    Home Medications Prior to Admission medications   Medication Sig Start Date End Date Taking? Authorizing Provider  ARIPiprazole (ABILIFY) 5 MG tablet Take 1 tablet (5  mg total) by mouth daily. 10/16/20 10/16/21  Arfeen, Arlyce Harman, MD  cyclobenzaprine (FLEXERIL) 10 MG tablet Take 1 tablet (10 mg total) by mouth 2 (two) times daily as needed for muscle spasms. 05/18/20   Dorie Rank, MD  dicyclomine (BENTYL) 20 MG tablet Take 1 tablet (20 mg total) by mouth 3 (three) times daily as needed. 11/24/20   Long, Wonda Olds, MD  hydrOXYzine (ATARAX/VISTARIL) 50 MG tablet Take 1 tablet (50 mg total) by mouth at bedtime as needed for anxiety. 04/23/20   Arfeen, Arlyce Harman, MD  ibuprofen (ADVIL,MOTRIN) 200 MG tablet Take 800 mg by mouth every 6 (six) hours as needed for headache (leg pain).    [provider]  Multiple Vitamin (MULTIVITAMIN WITH MINERALS) TABS tablet Take 1 tablet by mouth daily.    [provider]  naproxen (NAPROSYN) 375 MG tablet Take 1 tablet (375 mg total) by mouth 2 (two) times daily. 05/18/20    Dorie Rank, MD  ondansetron (ZOFRAN ODT) 4 MG disintegrating tablet Take 1 tablet (4 mg total) by mouth every 8 (eight) hours as needed. 11/24/20   Long, Wonda Olds, MD  pantoprazole (PROTONIX) 40 MG tablet Take 1 tablet (40 mg total) by mouth daily. 11/24/20 12/24/20  Long, Wonda Olds, MD  SUMAtriptan (IMITREX) 100 MG tablet Take 1 tablet earliest onset of migraine.  May repeat x1 in 2 hours if headache persists or recurs.  Do not exceed 2 tablets in 24h 06/11/18   Tomi Likens, Adam R, DO  venlafaxine XR (EFFEXOR-XR) 75 MG 24 hr capsule Take 1 capsule (75 mg total) by mouth daily. For mood control 10/16/20   Arfeen, Arlyce Harman, MD    Allergies    Morphine and related, Adhesive [tape], and Latex  Review of Systems   Review of Systems  Constitutional: Positive for fatigue. Negative for fever.  HENT: Negative for sore throat.   Eyes: Negative for visual disturbance.  Respiratory: Positive for cough and shortness of breath.   Cardiovascular: Positive for chest pain.  Gastrointestinal: Positive for abdominal pain, diarrhea, nausea and vomiting. Negative for constipation.  Genitourinary: Negative for difficulty urinating.  Musculoskeletal: Positive for back pain. Negative for neck pain.  Skin: Negative for rash.  Neurological: Negative for syncope and headaches.    Physical Exam Updated Vital Signs BP (!) 137/106   Pulse (!) 113   Temp 98.1 F (36.7 C) (Oral)   Resp 16   LMP 11/14/2020 Comment: tubaligation  SpO2 98%   Physical Exam Vitals and nursing note reviewed.  Constitutional:      General: She is not in acute distress.    Appearance: She is well-developed and well-nourished. She is not diaphoretic.  HENT:     Head: Normocephalic and atraumatic.  Eyes:     Extraocular Movements: EOM normal.     Conjunctiva/sclera: Conjunctivae normal.  Cardiovascular:     Rate and Rhythm: Regular rhythm. Tachycardia present.     Pulses: Intact distal pulses.     Heart sounds: Normal heart sounds. No  murmur heard. No friction rub. No gallop.   Pulmonary:     Effort: Pulmonary effort is normal. No respiratory distress.     Breath sounds: Normal breath sounds. No wheezing or rales.  Abdominal:     General: There is no distension.     Palpations: Abdomen is soft.     Tenderness: There is abdominal tenderness in the right upper quadrant. There is no guarding. Negative signs include Murphy's sign.  Musculoskeletal:  General: No tenderness or edema.     Cervical back: Normal range of motion.  Skin:    General: Skin is warm and dry.     Findings: No erythema or rash.  Neurological:     Mental Status: She is alert and oriented to person, place, and time.     ED Results / Procedures / Treatments   Labs (all labs ordered are listed, but only abnormal results are displayed) Labs Reviewed  CBC - Abnormal; Notable for the following components:      Result Value   RBC 5.13 (*)    All other components within normal limits  COMPREHENSIVE METABOLIC PANEL - Abnormal; Notable for the following components:   Calcium 8.8 (*)    AST 13 (*)    All other components within normal limits  URINALYSIS, ROUTINE W REFLEX MICROSCOPIC - Abnormal; Notable for the following components:   APPearance HAZY (*)    Leukocytes,Ua LARGE (*)    Bacteria, UA RARE (*)    All other components within normal limits  SARS CORONAVIRUS 2 (TAT 6-24 HRS)  LIPASE, BLOOD  I-STAT BETA HCG BLOOD, ED ([facility], [facility], [facility] ONLY)  TROPONIN I (HIGH SENSITIVITY)  TROPONIN I (HIGH SENSITIVITY)    EKG EKG Interpretation  Date/Time:  Saturday November 28 2020 11:27:32 EST Ventricular Rate:  110 PR Interval:    QRS Duration: 76 QT Interval:  318 QTC Calculation: 431 R Axis:   59 Text Interpretation: Sinus tachycardia Ventricular premature complex 12 Lead; [place] No significant change since last tracing Confirmed by Gareth Morgan [phone]) on 11/28/2020 2:09:47 PM   Radiology DG Chest 2 View  Result Date:  11/28/2020 CLINICAL DATA:  Chest pain with reported hematemesis EXAM: CHEST - 2 VIEW COMPARISON:  Chest radiograph November 24, 2020 FINDINGS: The heart size and mediastinal contours are unchanged. No focal consolidation. No pleural effusion. No pneumothorax. The visualized skeletal structures are unchanged. IMPRESSION: No active cardiopulmonary disease. Electronically Signed   By: Dahlia Bailiff MD   On: 11/28/2020 12:21   CT Angio Chest/Abd/Pel for Dissection W and/or Wo Contrast  Result Date: 11/28/2020 CLINICAL DATA:  Lower abdominal pain since Monday with chest pain and shortness of breath today EXAM: CT ANGIOGRAPHY CHEST, ABDOMEN AND PELVIS TECHNIQUE: Multidetector CT imaging through the chest, abdomen and pelvis was performed using the standard protocol during bolus administration of intravenous contrast. Multiplanar reconstructed images and MIPs were obtained and reviewed to evaluate the vascular anatomy. CONTRAST:  16mL OMNIPAQUE IOHEXOL 350 MG/ML SOLN COMPARISON:  CT abdomen pelvis November 24, 2020 and CT a chest March 27, 2019. FINDINGS: CTA CHEST FINDINGS Cardiovascular: No evidence of intramural hematoma on noncontrast sequence. Preferential opacification of the thoracic aorta. No evidence of thoracic aortic aneurysm or dissection. Normal heart size. No pericardial effusion. Mediastinum/Nodes: No enlarged mediastinal, hilar, or axillary lymph nodes. Thyroid gland, trachea, and esophagus demonstrate no significant findings. Lungs/Pleura: Unchanged biapical pleuroparenchymal scarring. No suspicious pulmonary nodules or masses. No pleural effusion or pneumothorax. Musculoskeletal: No chest wall abnormality. No acute or significant osseous findings. Review of the MIP images confirms the above findings. CTA ABDOMEN AND PELVIS FINDINGS VASCULAR Aorta: Normal caliber aorta without aneurysm, dissection, vasculitis or significant stenosis. Celiac: Patent without evidence of aneurysm, dissection, vasculitis  or significant stenosis. SMA: Patent without evidence of aneurysm, dissection, vasculitis or significant stenosis. Renals: Both renal arteries are patent without evidence of aneurysm, dissection, vasculitis, fibromuscular dysplasia or significant stenosis. IMA: Patent without evidence of aneurysm, dissection, vasculitis or significant stenosis.  Inflow: Patent without evidence of aneurysm, dissection, vasculitis or significant stenosis. Veins: No obvious venous abnormality within the limitations of this arterial phase study. Review of the MIP images confirms the above findings. NON-VASCULAR Hepatobiliary: No focal liver abnormality is seen. No gallstones, gallbladder wall thickening, or biliary dilatation. Pancreas: Unremarkable. No pancreatic ductal dilatation or surrounding inflammatory changes. Spleen: Normal in size without focal abnormality. Adrenals/Urinary Tract: Adrenal glands are unremarkable. Kidneys are normal, without renal calculi, focal lesion, or hydronephrosis. Bladder is unremarkable. Stomach/Bowel: Small gastric diverticulum on image 74/6 otherwise within normal limits. Normal positioning of the duodenum/ligament of Treitz. Appendix and terminal ileum appear grossly unremarkable. Colonic diverticula without evidence of acute diverticulitis. No evidence of bowel wall thickening, distention, or inflammatory changes. Lymphatic: No abdominopelvic adenopathy. Reproductive: Left ovarian corpus luteal cyst. Uterus appears unremarkable. Right ovary is unremarkable. Other: Trace pelvic free fluid. 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 T12, L1 and L5 spina bifida occulta (normal variant). No acute osseous abnormality. Review of the MIP images confirms the above findings. IMPRESSION: 1. No evidence of acute aortic syndrome. 2. Left ovarian corpus luteal cyst with trace likely physiologic pelvic free fluid, which can be a cause of pelvic pain  (Mittelschmerz). 3. Colonic diverticulosis without evidence of acute diverticulitis. 4. 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 Electronically Signed   By: Dahlia Bailiff MD   On: 11/28/2020 13:51    Procedures Procedures   Medications Ordered in ED Medications  sodium chloride 0.9 % bolus 1,000 mL (1,000 mLs Intravenous New Bag/Given 11/28/20 1303)  iohexol (OMNIPAQUE) 350 MG/ML injection 100 mL (100 mLs Intravenous Contrast Given 11/28/20 1321)    ED Course  I have reviewed the triage vital signs and the nursing notes.  Pertinent labs & imaging results that were available during my care of the patient were reviewed by me and considered in my medical decision making (see chart for details).    MDM Rules/Calculators/A&P                          45yo female with history of bipolar, depression, recent emergency department 4 days ago for abdominal pain, nausea and vomiting, presents with concern for continuing abdominal pain, nausea and vomiting, and development of left-sided chest and back pain.  Differential diagnosis includes aortic dissection, ACS, pulmonary embolus, pancreatitis, cholecystitis, GERD.  Given description of sharp chest pain radiating to the back, CT dissection study was done which showed no evidence of aortic dissection or pathology, and also did not show signs of cholecystitis or other acute abnormalities in the chest abdomen or pelvis.  Incidentally shows left ovarian corpus luteal cyst with trace likely physiologic free fluid, however patient does not describe lower abdominal or pelvic pain.  Troponin negative.  Hemoglobin 14.5, doubt clinically significant GI bleed.  Urinalysis shows white blood cells, consider possible urinary tract infection.  Suspect likely viral syndrome with gastritis, possible urinary tract infection.  COVID-19 testing ordered and pending.  Given prescription for antibiotic recommend continued PPI,  nausea medication, PCP follow up.    Final Clinical Impression(s) / ED Diagnoses Final diagnoses:  Chest pain, unspecified type  Abdominal pain, unspecified abdominal location  Cough  Acute gastritis, presence of bleeding unspecified, unspecified gastritis type    Rx / DC Orders ED Discharge Orders    None       Gareth Morgan, MD 11/28/20 1547

## 2020-11-29 LAB — SARS CORONAVIRUS 2 (TAT 6-24 HRS): SARS Coronavirus 2: NEGATIVE

## 2020-11-30 ENCOUNTER — Encounter (HOSPITAL_COMMUNITY): Payer: Self-pay

## 2020-11-30 ENCOUNTER — Emergency Department (HOSPITAL_COMMUNITY)
Admission: EM | Admit: 2020-11-30 | Discharge: 2020-12-01 | Disposition: A | Discharge status: Home / Self Care | Payer: Managed Care, Other (non HMO) | Attending: Emergency Medicine | Admitting: Emergency Medicine

## 2020-11-30 DIAGNOSIS — Z20822 Contact with and (suspected) exposure to covid-19: Secondary | ICD-10-CM | POA: Insufficient documentation

## 2020-11-30 DIAGNOSIS — F329 Major depressive disorder, single episode, unspecified: Secondary | ICD-10-CM | POA: Insufficient documentation

## 2020-11-30 DIAGNOSIS — Z91040 Latex allergy status: Secondary | ICD-10-CM | POA: Insufficient documentation

## 2020-11-30 DIAGNOSIS — R1013 Epigastric pain: Secondary | ICD-10-CM | POA: Insufficient documentation

## 2020-11-30 DIAGNOSIS — R45851 Suicidal ideations: Secondary | ICD-10-CM | POA: Insufficient documentation

## 2020-11-30 DIAGNOSIS — R109 Unspecified abdominal pain: Secondary | ICD-10-CM

## 2020-11-30 DIAGNOSIS — Z87891 Personal history of nicotine dependence: Secondary | ICD-10-CM | POA: Diagnosis not present

## 2020-11-30 LAB — CBC WITH DIFFERENTIAL/PLATELET
Abs Immature Granulocytes: 0.03 10*3/uL (ref 0.00–0.07)
Basophils Absolute: 0.1 10*3/uL (ref 0.0–0.1)
Basophils Relative: 1 %
Eosinophils Absolute: 0.2 10*3/uL (ref 0.0–0.5)
Eosinophils Relative: 3 %
HCT: 41.7 % (ref 36.0–46.0)
Hemoglobin: 13.7 g/dL (ref 12.0–15.0)
Immature Granulocytes: 0 %
Lymphocytes Relative: 26 %
Lymphs Abs: 2 10*3/uL (ref 0.7–4.0)
MCH: 28.5 pg (ref 26.0–34.0)
MCHC: 32.9 g/dL (ref 30.0–36.0)
MCV: 86.9 fL (ref 80.0–100.0)
Monocytes Absolute: 0.6 10*3/uL (ref 0.1–1.0)
Monocytes Relative: 8 %
Neutro Abs: 4.9 10*3/uL (ref 1.7–7.7)
Neutrophils Relative %: 62 %
Platelets: 225 10*3/uL (ref 150–400)
RBC: 4.8 MIL/uL (ref 3.87–5.11)
RDW: 13.9 % (ref 11.5–15.5)
WBC: 7.8 10*3/uL (ref 4.0–10.5)
nRBC: 0 % (ref 0.0–0.2)

## 2020-11-30 LAB — RAPID URINE DRUG SCREEN, HOSP PERFORMED
Amphetamines: NOT DETECTED
Barbiturates: NOT DETECTED
Benzodiazepines: NOT DETECTED
Cocaine: NOT DETECTED
Opiates: NOT DETECTED
Tetrahydrocannabinol: NOT DETECTED

## 2020-11-30 LAB — ETHANOL: Alcohol, Ethyl (B): <10 mg/dL (ref <10)

## 2020-11-30 LAB — COMPREHENSIVE METABOLIC PANEL
ALT: 12 U/L (ref 0–44)
AST: 13 U/L — ABNORMAL LOW (ref 15–41)
Albumin: 3.7 g/dL (ref 3.5–5.0)
Alkaline Phosphatase: 52 U/L (ref 38–126)
Anion gap: 9 (ref 5–15)
BUN: 11 mg/dL (ref 6–20)
CO2: 23 mmol/L (ref 22–32)
Calcium: 8.4 mg/dL — ABNORMAL LOW (ref 8.9–10.3)
Chloride: 106 mmol/L (ref 98–111)
Creatinine, Ser: 0.63 mg/dL (ref 0.44–1.00)
GFR, Estimated: >60 mL/min (ref >60)
Glucose, Bld: 93 mg/dL (ref 70–99)
Potassium: 3.8 mmol/L (ref 3.5–5.1)
Sodium: 138 mmol/L (ref 135–145)
Total Bilirubin: 0.4 mg/dL (ref 0.3–1.2)
Total Protein: 6.7 g/dL (ref 6.5–8.1)

## 2020-11-30 LAB — PREGNANCY, URINE: Preg Test, Ur: NEGATIVE

## 2020-11-30 MED ORDER — ZOLPIDEM TARTRATE 5 MG PO TABS: 5.0000 mg | ORAL_TABLET | Freq: Every evening | ORAL | Status: DC | PRN

## 2020-11-30 MED ORDER — CEPHALEXIN 500 MG PO CAPS
500.0000 mg | ORAL_CAPSULE | Freq: Three times a day (TID) | ORAL | Status: DC
  Administered 2020-11-30 – 2020-12-01 (×3): 500 mg via ORAL
  Filled 2020-11-30 (×3): qty 1

## 2020-11-30 MED ORDER — ACETAMINOPHEN 325 MG PO TABS
650.0000 mg | ORAL_TABLET | ORAL | Status: DC | PRN
  Administered 2020-11-30 – 2020-12-01 (×2): 650 mg via ORAL
  Filled 2020-11-30 (×2): qty 2

## 2020-11-30 MED ORDER — DICYCLOMINE HCL 20 MG PO TABS
20.0000 mg | ORAL_TABLET | Freq: Three times a day (TID) | ORAL | Status: DC | PRN
  Administered 2020-11-30: 20 mg via ORAL
  Filled 2020-11-30: qty 1

## 2020-11-30 MED ORDER — ARIPIPRAZOLE 5 MG PO TABS
5.0000 mg | ORAL_TABLET | Freq: Every day | ORAL | Status: DC
  Administered 2020-11-30: 5 mg via ORAL
  Filled 2020-11-30: qty 1

## 2020-11-30 MED ORDER — VENLAFAXINE HCL ER 75 MG PO CP24
75.0000 mg | ORAL_CAPSULE | Freq: Every day | ORAL | Status: DC
  Administered 2020-11-30: 75 mg via ORAL
  Filled 2020-11-30: qty 1

## 2020-11-30 MED ORDER — ALUM & MAG HYDROXIDE-SIMETH 200-200-20 MG/5ML PO SUSP: 30.0000 mL | Freq: Four times a day (QID) | ORAL | Status: DC | PRN

## 2020-11-30 MED ORDER — ONDANSETRON HCL 4 MG PO TABS: 4.0000 mg | ORAL_TABLET | Freq: Three times a day (TID) | ORAL | Status: DC | PRN

## 2020-11-30 MED ORDER — SUMATRIPTAN SUCCINATE 25 MG PO TABS
25.0000 mg | ORAL_TABLET | ORAL | Status: DC | PRN
  Filled 2020-11-30: qty 1

## 2020-11-30 NOTE — ED Notes (Signed)
Patients belongings include one navy jacket, 2 shoes, 1 black shirt, 1 pair of tan pants. Stored at nurses station across from room 12. Patient changed into burgundy scrubs.

## 2020-11-30 NOTE — ED Triage Notes (Signed)
Pt came in with c/o SI. Pt states she woke up this morning and started cutting her L wrist. Pt states that work has really been stressing her out. Also, she has had abdominal pain that hasn't gone away despite meds. Pt was seen here on 2/26/ for same. Pt describes pain as "feeling full" and it radiates across her upper quadrants.

## 2020-11-30 NOTE — BH Assessment (Incomplete)
Comprehensive Clinical Assessment (CCA) Note  11/30/2020 Shannon Kemp [id number]     Chief Complaint:  Chief Complaint  Patient presents with  . Suicidal  . Abdominal Pain   Visit Diagnosis: ***   CCA Screening, Triage and Referral (STR)  Patient Reported Information How did you hear about [place]? No data recorded Referral name: No data recorded Referral phone number: No data recorded  Whom do you see for routine medical problems? No data recorded Practice/Facility Name: No data recorded Practice/Facility Phone Number: No data recorded Name of Contact: No data recorded Contact Number: No data recorded Contact Fax Number: No data recorded Prescriber Name: No data recorded Prescriber Address (if known): No data recorded  What Is the Reason for Your Visit/Call Today? No data recorded How Long Has This Been Causing You Problems? No data recorded What Do You Feel Would Help You the Most Today? No data recorded  Have You Recently Been in Any Inpatient Treatment (Hospital/Detox/Crisis Center/28-Day Program)? No data recorded Name/Location of Program/Hospital:No data recorded How Long Were You There? No data recorded When Were You Discharged? No data recorded  Have You Ever Received Services From [facility] Before? No data recorded Who Do You See at [facility]? No data recorded  Have You Recently Had Any Thoughts About Hurting Yourself? No data recorded Are You Planning to Commit Suicide/Harm Yourself At This time? No data recorded  Have you Recently Had Thoughts About [place]? No data recorded Explanation: No data recorded  Have You Used Any Alcohol or Drugs in the Past 24 Hours? No data recorded How Long Ago Did You Use Drugs or Alcohol? No data recorded What Did You Use and How Much? No data recorded  Do You Currently Have a Therapist/Psychiatrist? No data recorded Name of Therapist/Psychiatrist: No data recorded  Have You Been Recently Discharged  From Any Office Practice or Programs? No data recorded Explanation of Discharge From Practice/Program: No data recorded    CCA Screening Triage Referral Assessment Type of Contact: No data recorded Is this Initial or Reassessment? No data recorded Date Telepsych consult ordered in [facility]:  No data recorded Time Telepsych consult ordered in [facility]:  No data recorded  Patient Reported Information Reviewed? No data recorded Patient Left Without Being Seen? No data recorded Reason for Not Completing Assessment: No data recorded  Collateral Involvement: No data recorded  Does Patient Have a [place]? No data recorded Name and Contact of Legal Guardian: No data recorded If Minor and Not Living with Parent(s), Who has Custody? No data recorded Is CPS involved or ever been involved? No data recorded Is APS involved or ever been involved? No data recorded  Patient Determined To Be At Risk for Harm To Self or Others Based on Review of Patient Reported Information or Presenting Complaint? No data recorded Method: No data recorded Availability of Means: No data recorded Intent: No data recorded Notification Required: No data recorded Additional Information for Danger to Others Potential: No data recorded Additional Comments for Danger to Others Potential: No data recorded Are There Guns or Other Weapons in Your Home? No data recorded Types of Guns/Weapons: No data recorded Are These Weapons Safely Secured?                            No data recorded Who Could Verify You Are Able To Have These Secured: No data recorded Do You Have any Outstanding Charges, Pending Court  Dates, Parole/Probation? No data recorded Contacted To Inform of Risk of Harm To Self or Others: No data recorded  Location of Assessment: No data recorded  Does Patient Present under Involuntary Commitment? No data recorded IVC Papers Initial File Date: No data recorded  [place] of Residence: No data  recorded  Patient Currently Receiving the Following Services: No data recorded  Determination of Need: No data recorded  Options For Referral: No data recorded    CCA Biopsychosocial Intake/Chief Complaint:  No data recorded Current Symptoms/Problems: No data recorded  Patient Reported Schizophrenia/Schizoaffective Diagnosis in Past: No data recorded  Strengths: No data recorded Preferences: No data recorded Abilities: No data recorded  Type of Services Patient Feels are Needed: No data recorded  Initial Clinical Notes/Concerns: No data recorded  Mental Health Symptoms Depression:  No data recorded  Duration of Depressive symptoms: No data recorded  Mania:  No data recorded  Anxiety:   No data recorded  Psychosis:  No data recorded  Duration of Psychotic symptoms: No data recorded  Trauma:  No data recorded  Obsessions:  No data recorded  Compulsions:  No data recorded  Inattention:  No data recorded  Hyperactivity/Impulsivity:  No data recorded  Oppositional/Defiant Behaviors:  No data recorded  Emotional Irregularity:  No data recorded  Other Mood/Personality Symptoms:  No data recorded   Mental Status Exam Appearance and self-care  Stature:  No data recorded  Weight:  No data recorded  Clothing:  No data recorded  Grooming:  No data recorded  Cosmetic use:  No data recorded  Posture/gait:  No data recorded  Motor activity:  No data recorded  Sensorium  Attention:  No data recorded  Concentration:  No data recorded  Orientation:  No data recorded  Recall/memory:  No data recorded  Affect and Mood  Affect:  No data recorded  Mood:  No data recorded  Relating  Eye contact:  No data recorded  Facial expression:  No data recorded  Attitude toward examiner:  No data recorded  Thought and Language  Speech flow: No data recorded  Thought content:  No data recorded  Preoccupation:  No data recorded  Hallucinations:  No data recorded  Organization:  No data  recorded  [employer] of Knowledge:  No data recorded  Intelligence:  No data recorded  Abstraction:  No data recorded  Judgement:  No data recorded  Reality Testing:  No data recorded  Insight:  No data recorded  Decision Making:  No data recorded  Social Functioning  Social Maturity:  No data recorded  Social Judgement:  No data recorded  Stress  Stressors:  No data recorded  Coping Ability:  No data recorded  Skill Deficits:  No data recorded  Supports:  No data recorded    Religion:    Leisure/Recreation:    Exercise/Diet:     CCA Employment/Education Employment/Work Situation:    Education:     CCA Family/Childhood History Family and Relationship History:    Childhood History:     Child/Adolescent Assessment:     CCA Substance Use Alcohol/Drug Use:                           ASAM's:  Six Dimensions of Multidimensional Assessment  Dimension 1:  Acute Intoxication and/or Withdrawal Potential:      Dimension 2:  Biomedical Conditions and Complications:      Dimension 3:  Emotional, Behavioral, or Cognitive Conditions and  Complications:     Dimension 4:  Readiness to Change:     Dimension 5:  Relapse, Continued use, or Continued Problem Potential:     Dimension 6:  Recovery/Living Environment:     ASAM Severity Score:    ASAM Recommended Level of Treatment:     Substance use Disorder (SUD)    Recommendations for Services/Supports/Treatments:    DSM5 Diagnoses: Patient Active Problem List   Diagnosis Date Noted  . Major depression 07/19/2017  . Bipolar disorder ([place]) 07/18/2017    Patient Centered Plan: Patient is on the following Treatment Plan(s):  {[facility] AMB BH OP Treatment Plans:[id number]}   Referrals to Alternative Service(s): Referred to Alternative Service(s):   Place:   Date:   Time:    Referred to Alternative Service(s):   Place:   Date:   Time:    Referred to Alternative Service(s):   Place:   Date:    Time:    Referred to Alternative Service(s):   Place:   Date:   Time:     Venora Maples, [facility]

## 2020-11-30 NOTE — ED Provider Notes (Signed)
[facility] DEPT Provider Note   CSN: [id number] Arrival date & time: 11/30/20  3664     History Chief Complaint  Patient presents with  . Suicidal  . Abdominal Pain    Shannon Kemp is a 45 y.o. female.  The history is provided by the patient and medical records. No language interpreter was used.  Abdominal Pain    45 year old female significant history of bipolar, depression, presenting with 2 different complaints.  Patient report feeling suicidal.  Patient report for the past week she has had recurrent upper abdominal discomfort.  She described more as a fullness sensation with occasional stabbing sensation to her right upper abdomen.  Pain is been waxing waning she feels bloated all the time, with excessive burping and normal bowel movement. Patient was seen 2 days ago for abdominal discomfort.  Patient has a dissection study including CT scan of the chest and abdomen pelvis without any acute finding.  She mentions she has been seen in the ER twice for this abdominal pain within the past week without definitive answer.  She initially did try to follow-up with a GI specialist but unable to pay the upfront charge and was not able to seen by 1.  Her PCP did schedule another GI appointment sometime today but this morning she woke up feeling overwhelmed and frustrated of her her current situation of not knowing what is going on her abdomen, as well as having feeling of depression with vague suicidal ideation.  She thought about cutting herself but was able to call her husband who came and brought her here.  She denies homicidal ideation or hallucination.  She does not self medicate with drugs or alcohol.  She denies any recent change in her medication and states she is compliant with her medication.  She report self-harm in the past.  Past Medical History:  Diagnosis Date  . Allergy   . Breast pain   . Bruises easily   . Fatigue   . Generalized headaches    . Nipple discharge   . Pneumothorax   . Sleep difficulties   . Weight increase     Patient Active Problem List   Diagnosis Date Noted  . Major depression 07/19/2017  . Bipolar disorder ([place]) 07/18/2017    Past Surgical History:  Procedure Laterality Date  . BILATERAL OOPHORECTOMY    . LAPAROSCOPY  04/01/2012   Procedure: LAPAROSCOPY OPERATIVE;  Surgeon: Claiborne Billings A. Pamala Hurry, MD;  Location: [facility];  Service: Gynecology;  Laterality: N/A;  Operative laparoscopy/right salpingectomy and removal of ectopic pregnancy  . LAPAROSCOPY Left 05/09/2013   Procedure: LAPAROSCOPY OPERATIVE/ECTOPIC;  Surgeon: Linda Hedges, DO;  Location: [facility];  Service: Gynecology;  Laterality: Left;  WITH LEFT SALPINGECTOMY  . ORIF ULNAR FRACTURE    . PLEURAL SCARIFICATION       OB History    Gravida  5   Para  2   Term  0   Preterm  2   AB  2   Living  2     SAB  1   IAB  0   Ectopic  1   Multiple  0   Live Births              Family History  Problem Relation Age of Onset  . Hypertension Mother   . Diabetes Mother   . Osteoarthritis Mother   . Hypertension Father   . Cancer Father   . Stroke Father   . Osteoarthritis  Father     Social History   Tobacco Use  . Smoking status: Former Research scientist (life sciences)  . Smokeless tobacco: Never Used  Vaping Use  . Vaping Use: Never used  Substance Use Topics  . Alcohol use: No  . Drug use: No    Home Medications Prior to Admission medications   Medication Sig Start Date End Date Taking? Authorizing Provider  ARIPiprazole (ABILIFY) 5 MG tablet Take 1 tablet (5 mg total) by mouth daily. 10/16/20 10/16/21  Arfeen, Arlyce Harman, MD  cephALEXin (KEFLEX) 500 MG capsule Take 1 capsule (500 mg total) by mouth 3 (three) times daily for 10 days. 11/28/20 12/08/20  Gareth Morgan, MD  cyclobenzaprine (FLEXERIL) 10 MG tablet Take 1 tablet (10 mg total) by mouth 2 (two) times daily as needed for muscle spasms. 05/18/20   Dorie Rank, MD  dicyclomine (BENTYL) 20 MG  tablet Take 1 tablet (20 mg total) by mouth 3 (three) times daily as needed. 11/24/20   Long, Wonda Olds, MD  hydrOXYzine (ATARAX/VISTARIL) 50 MG tablet Take 1 tablet (50 mg total) by mouth at bedtime as needed for anxiety. 04/23/20   Arfeen, Arlyce Harman, MD  ibuprofen (ADVIL,MOTRIN) 200 MG tablet Take 800 mg by mouth every 6 (six) hours as needed for headache (leg pain).    [provider]  Multiple Vitamin (MULTIVITAMIN WITH MINERALS) TABS tablet Take 1 tablet by mouth daily.    [provider]  naproxen (NAPROSYN) 375 MG tablet Take 1 tablet (375 mg total) by mouth 2 (two) times daily. 05/18/20   Dorie Rank, MD  ondansetron (ZOFRAN ODT) 4 MG disintegrating tablet Take 1 tablet (4 mg total) by mouth every 8 (eight) hours as needed. 11/24/20   Long, Wonda Olds, MD  pantoprazole (PROTONIX) 40 MG tablet Take 1 tablet (40 mg total) by mouth daily. 11/24/20 12/24/20  Long, Wonda Olds, MD  SUMAtriptan (IMITREX) 100 MG tablet Take 1 tablet earliest onset of migraine.  May repeat x1 in 2 hours if headache persists or recurs.  Do not exceed 2 tablets in 24h 06/11/18   Tomi Likens, Adam R, DO  venlafaxine XR (EFFEXOR-XR) 75 MG 24 hr capsule Take 1 capsule (75 mg total) by mouth daily. For mood control 10/16/20   Arfeen, Arlyce Harman, MD    Allergies    Morphine and related, Adhesive [tape], and Latex  Review of Systems   Review of Systems  Gastrointestinal: Positive for abdominal pain.  All other systems reviewed and are negative.   Physical Exam Updated Vital Signs BP (!) 126/97 (BP Location: Left Arm)   Pulse 89   Temp 98.2 F (36.8 C) (Oral)   Resp 17   Ht 5\' 5"  (1.651 m)   Wt 78.9 kg   LMP 11/14/2020 Comment: tubaligation  SpO2 99%   BMI 28.96 kg/m   Physical Exam Vitals and nursing note reviewed.  Constitutional:      General: She is not in acute distress.    Appearance: She is well-developed and well-nourished. She is obese.  HENT:     Head: Atraumatic.  Eyes:     Conjunctiva/sclera:  Conjunctivae normal.  Cardiovascular:     Rate and Rhythm: Normal rate and regular rhythm.  Pulmonary:     Effort: Pulmonary effort is normal.  Abdominal:     General: Abdomen is flat. Bowel sounds are normal. There is abdominal bruit (Mild tenderness epigastric and right upper quadrant on palpation no guarding no rebound tenderness.).     Palpations: Abdomen is soft.  Musculoskeletal:     Cervical back: Neck supple.  Skin:    Findings: No rash.  Neurological:     Mental Status: She is alert.     GCS: GCS eye subscore is 4. GCS verbal subscore is 5. GCS motor subscore is 6.  Psychiatric:        Attention and Perception: Attention normal.        Mood and Affect: Mood is depressed.        Speech: Speech normal.        Behavior: Behavior is cooperative.        Thought Content: Thought content is not paranoid. Thought content does not include homicidal or suicidal ideation.     ED Results / Procedures / Treatments   Labs (all labs ordered are listed, but only abnormal results are displayed) Labs Reviewed  COMPREHENSIVE METABOLIC PANEL - Abnormal; Notable for the following components:      Result Value   Calcium 8.4 (*)    AST 13 (*)    All other components within normal limits  CBC WITH DIFFERENTIAL/PLATELET  RAPID URINE DRUG SCREEN, HOSP PERFORMED  PREGNANCY, URINE  ETHANOL  I-STAT BETA HCG BLOOD, ED ([facility], [facility], [facility] ONLY)    EKG None  Radiology DG Chest 2 View  Result Date: 11/28/2020 CLINICAL DATA:  Chest pain with reported hematemesis EXAM: CHEST - 2 VIEW COMPARISON:  Chest radiograph November 24, 2020 FINDINGS: The heart size and mediastinal contours are unchanged. No focal consolidation. No pleural effusion. No pneumothorax. The visualized skeletal structures are unchanged. IMPRESSION: No active cardiopulmonary disease. Electronically Signed   By: Dahlia Bailiff MD   On: 11/28/2020 12:21   CT Angio Chest/Abd/Pel for Dissection W and/or Wo Contrast  Result Date:  11/28/2020 CLINICAL DATA:  Lower abdominal pain since Monday with chest pain and shortness of breath today EXAM: CT ANGIOGRAPHY CHEST, ABDOMEN AND PELVIS TECHNIQUE: Multidetector CT imaging through the chest, abdomen and pelvis was performed using the standard protocol during bolus administration of intravenous contrast. Multiplanar reconstructed images and MIPs were obtained and reviewed to evaluate the vascular anatomy. CONTRAST:  139mL OMNIPAQUE IOHEXOL 350 MG/ML SOLN COMPARISON:  CT abdomen pelvis November 24, 2020 and CT a chest March 27, 2019. FINDINGS: CTA CHEST FINDINGS Cardiovascular: No evidence of intramural hematoma on noncontrast sequence. Preferential opacification of the thoracic aorta. No evidence of thoracic aortic aneurysm or dissection. Normal heart size. No pericardial effusion. Mediastinum/Nodes: No enlarged mediastinal, hilar, or axillary lymph nodes. Thyroid gland, trachea, and esophagus demonstrate no significant findings. Lungs/Pleura: Unchanged biapical pleuroparenchymal scarring. No suspicious pulmonary nodules or masses. No pleural effusion or pneumothorax. Musculoskeletal: No chest wall abnormality. No acute or significant osseous findings. Review of the MIP images confirms the above findings. CTA ABDOMEN AND PELVIS FINDINGS VASCULAR Aorta: Normal caliber aorta without aneurysm, dissection, vasculitis or significant stenosis. Celiac: Patent without evidence of aneurysm, dissection, vasculitis or significant stenosis. SMA: Patent without evidence of aneurysm, dissection, vasculitis or significant stenosis. Renals: Both renal arteries are patent without evidence of aneurysm, dissection, vasculitis, fibromuscular dysplasia or significant stenosis. IMA: Patent without evidence of aneurysm, dissection, vasculitis or significant stenosis. Inflow: Patent without evidence of aneurysm, dissection, vasculitis or significant stenosis. Veins: No obvious venous abnormality within the limitations of  this arterial phase study. Review of the MIP images confirms the above findings. NON-VASCULAR Hepatobiliary: No focal liver abnormality is seen. No gallstones, gallbladder wall thickening, or biliary dilatation. Pancreas: Unremarkable. No pancreatic ductal dilatation or surrounding inflammatory changes. Spleen: Normal  in size without focal abnormality. Adrenals/Urinary Tract: Adrenal glands are unremarkable. Kidneys are normal, without renal calculi, focal lesion, or hydronephrosis. Bladder is unremarkable. Stomach/Bowel: Small gastric diverticulum on image 74/6 otherwise within normal limits. Normal positioning of the duodenum/ligament of Treitz. Appendix and terminal ileum appear grossly unremarkable. Colonic diverticula without evidence of acute diverticulitis. No evidence of bowel wall thickening, distention, or inflammatory changes. Lymphatic: No abdominopelvic adenopathy. Reproductive: Left ovarian corpus luteal cyst. Uterus appears unremarkable. Right ovary is unremarkable. Other: Trace pelvic free fluid. 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 T12, L1 and L5 spina bifida occulta (normal variant). No acute osseous abnormality. Review of the MIP images confirms the above findings. IMPRESSION: 1. No evidence of acute aortic syndrome. 2. Left ovarian corpus luteal cyst with trace likely physiologic pelvic free fluid, which can be a cause of pelvic pain (Mittelschmerz). 3. Colonic diverticulosis without evidence of acute diverticulitis. 4. 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 Electronically Signed   By: Dahlia Bailiff MD   On: 11/28/2020 13:51    Procedures Procedures   Medications Ordered in ED Medications  acetaminophen (TYLENOL) tablet 650 mg (has no administration in time range)  zolpidem (AMBIEN) tablet 5 mg (has no administration in time range)  ondansetron (ZOFRAN) tablet 4 mg (has no  administration in time range)  alum & mag hydroxide-simeth (MAALOX/MYLANTA) 200-200-20 MG/5ML suspension 30 mL (has no administration in time range)  ARIPiprazole (ABILIFY) tablet 5 mg (has no administration in time range)  cephALEXin (KEFLEX) capsule 500 mg (has no administration in time range)  dicyclomine (BENTYL) tablet 20 mg (has no administration in time range)  SUMAtriptan (IMITREX) tablet 25 mg (has no administration in time range)  venlafaxine XR (EFFEXOR-XR) 24 hr capsule 75 mg (has no administration in time range)    ED Course  I have reviewed the triage vital signs and the nursing notes.  Pertinent labs & imaging results that were available during my care of the patient were reviewed by me and considered in my medical decision making (see chart for details).    MDM Rules/Calculators/A&P                          BP (!) 131/98 (BP Location: Right Arm)   Pulse 93   Temp 98.2 F (36.8 C) (Oral)   Resp 16   Ht 5\' 5"  (1.651 m)   Wt 78.9 kg   LMP 11/14/2020 Comment: tubaligation  SpO2 99%   BMI 28.96 kg/m   Final Clinical Impression(s) / ED Diagnoses Final diagnoses:  Suicidal ideation  Abdominal pain, unspecified abdominal location    Rx / DC Orders ED Discharge Orders    None     7:06 AM Patient here with suicidal ideation and report due to frustration of her abdominal pain for the past week without any definitive diagnosis despite being seen in the ED twice.  She has had extensive work-up including chest abdomen pelvis CT scan without acute finding.  She describes more of a indigestion and likely would benefit from GI follow-up.  She does have psychiatric history and has had self-harm in the past.  At this time she is voluntary committed, will consult psychiatry to evaluate patient.  Otherwise, patient is medically cleared.  I have low suspicion for cholecystitis causing her symptoms.  10:13 AM Patient is medically cleared and can be assessed further by TTS and  psychiatry  for her suicidal ideation   Domenic Moras, Hershal Coria 11/30/20 1514    Veryl Speak, MD 12/04/20 [phone]

## 2020-11-30 NOTE — ED Notes (Signed)
Patient wanded by security. 

## 2020-12-01 LAB — SARS CORONAVIRUS 2 (TAT 6-24 HRS): SARS Coronavirus 2: NEGATIVE

## 2020-12-01 NOTE — ED Notes (Signed)
Patient moved to room 22 for TTS consult

## 2020-12-01 NOTE — BH Assessment (Signed)
Comprehensive Clinical Assessment (CCA) Note  12/01/2020 Shannon Kemp [id number]   Shannon Kemp is a female presenting voluntarily to [facility] due to [facility] and self-harming behaviors. Patient denied HI and psychosis. Patient reported SI related to unknown continual dull sharp pain in her abdomin. Patient reported waking up this morning and holding a knife to her wrist for a few minutes and cutting herself, stating "I was SI, but I don't want to do, I am fighting with in my self". Patient reported main stressors include, unknown abdominal pain and work related stressors of being under sales.   Patient reported in 2018 patient was inpatient at [facility] due to attempted suicide of cutting wrist. Patient reported receiving medication management from [facility]. Patient is currently taking Abilify, Effexor and Vistaril. Patient reported that her medications are working.   Patient has had abdominal pain that hasn't gone away despite medications. Patient was seen by TTS on 11/28/20 with same presentation. Patient describes pain as "feeling dull" and it radiates across her upper quadrants. Patient was   Collateral contact, Shannon Kemp, husband, consent given by patient.  [phone]) [phone]. Shannon Kemp provided additional information listed above. Shannon Kemp reported patient being in major pain without reason. Shannon Kemp reported no concern regarding patient harming herself and stated patient has never had SI in the past. Shannon Kemp has agreed to provide care to wife if needed.   Disposition Shannon Romp, NP, patient does not meet inpatient criteria. Patient will follow up with outpatient resources faxed to patient.   Chief Complaint:  Chief Complaint  Patient presents with  . Suicidal  . Abdominal Pain   Visit Diagnosis: Major depressive disorder  CCA Screening, Triage and Referral (STR)  Patient Reported Information How did you hear about [place]? No data recorded Referral name: No data recorded Referral phone  number: No data recorded  Whom do you see for routine medical problems? No data recorded Practice/Facility Name: No data recorded Practice/Facility Phone Number: No data recorded Name of Contact: No data recorded Contact Number: No data recorded Contact Fax Number: No data recorded Prescriber Name: No data recorded Prescriber Address (if known): No data recorded  What Is the Reason for Your Visit/Call Today? No data recorded How Long Has This Been Causing You Problems? No data recorded What Do You Feel Would Help You the Most Today? No data recorded  Have You Recently Been in Any Inpatient Treatment (Hospital/Detox/Crisis Center/28-Day Program)? No data recorded Name/Location of Program/Hospital:No data recorded How Long Were You There? No data recorded When Were You Discharged? No data recorded  Have You Ever Received Services From [facility] Before? No data recorded Who Do You See at [facility]? No data recorded  Have You Recently Had Any Thoughts About Hurting Yourself? No data recorded Are You Planning to Commit Suicide/Harm Yourself At This time? No data recorded  Have you Recently Had Thoughts About [place]? No data recorded Explanation: No data recorded  Have You Used Any Alcohol or Drugs in the Past 24 Hours? No data recorded How Long Ago Did You Use Drugs or Alcohol? No data recorded What Did You Use and How Much? No data recorded  Do You Currently Have a Therapist/Psychiatrist? No data recorded Name of Therapist/Psychiatrist: No data recorded  Have You Been Recently Discharged From Any Office Practice or Programs? No data recorded Explanation of Discharge From Practice/Program: No data recorded    CCA Screening Triage Referral Assessment Type of Contact: No data recorded Is this Initial or Reassessment?  No data recorded Date Telepsych consult ordered in [facility]:  No data recorded Time Telepsych consult ordered in [facility]:  No data recorded  Patient  Reported Information Reviewed? No data recorded Patient Left Without Being Seen? No data recorded Reason for Not Completing Assessment: No data recorded  Collateral Involvement: No data recorded  Does Patient Have a [place]? No data recorded Name and Contact of Legal Guardian: No data recorded If Minor and Not Living with Parent(s), Who has Custody? No data recorded Is CPS involved or ever been involved? No data recorded Is APS involved or ever been involved? No data recorded  Patient Determined To Be At Risk for Harm To Self or Others Based on Review of Patient Reported Information or Presenting Complaint? No data recorded Method: No data recorded Availability of Means: No data recorded Intent: No data recorded Notification Required: No data recorded Additional Information for Danger to Others Potential: No data recorded Additional Comments for Danger to Others Potential: No data recorded Are There Guns or Other Weapons in Your Home? No data recorded Types of Guns/Weapons: No data recorded Are These Weapons Safely Secured?                            No data recorded Who Could Verify You Are Able To Have These Secured: No data recorded Do You Have any Outstanding Charges, Pending Court Dates, Parole/Probation? No data recorded Contacted To Inform of Risk of Harm To Self or Others: No data recorded  Location of Assessment: No data recorded  Does Patient Present under Involuntary Commitment? No data recorded IVC Papers Initial File Date: No data recorded  [place] of Residence: No data recorded  Patient Currently Receiving the Following Services: No data recorded  Determination of Need: No data recorded  Options For Referral: No data recorded  CCA Biopsychosocial Intake/Chief Complaint:  Self harming behaviors of cutting wrist.  Current Symptoms/Problems: Medical related pain  Patient Reported Schizophrenia/Schizoaffective Diagnosis in Past:  No  Strengths: self-awareness  Preferences: uta  Abilities: uta  Type of Services Patient Feels are Needed: diagnosis of abdominal pain  Initial Clinical Notes/Concerns: No data recorded  Mental Health Symptoms Depression:  Hopelessness; Worthlessness   Duration of Depressive symptoms: No data recorded  Mania:  None   Anxiety:   Worrying   Psychosis:  None   Duration of Psychotic symptoms: No data recorded  Trauma:  None   Obsessions:  None   Compulsions:  None   Inattention:  None   Hyperactivity/Impulsivity:  N/A   Oppositional/Defiant Behaviors:  None   Emotional Irregularity:  None   Other Mood/Personality Symptoms:  No data recorded   Mental Status Exam Appearance and self-care  Stature:  Average   Weight:  Average weight   Clothing:  No data recorded  Grooming:  Normal   Cosmetic use:  No data recorded  Posture/gait:  Normal   Motor activity:  Not Remarkable   Sensorium  Attention:  Normal   Concentration:  Normal   Orientation:  X5   Recall/memory:  No data recorded  Affect and Mood  Affect:  Appropriate   Mood:  Anxious   Relating  Eye contact:  Normal   Facial expression:  Sad   Attitude toward examiner:  Cooperative   Thought and Language  Speech flow: Clear and Coherent   Thought content:  Appropriate to Mood and Circumstances   Preoccupation:  None   Hallucinations:  None  Organization:  No data recorded  [employer] of Knowledge:  Fair   Intelligence:  Average   Abstraction:  Normal   Judgement:  Fair   Art therapist:  No data recorded  Insight:  Fair   Decision Making:  Normal; Impulsive   Social Functioning  Social Maturity:  No data recorded  Social Judgement:  No data recorded  Stress  Stressors:  Work; Illness   Coping Ability:  Exhausted; Overwhelmed   Skill Deficits:  Self-control   Supports:  No data recorded   Religion:   Leisure/Recreation: Leisure / Recreation Do  You Have Hobbies?: Yes Leisure and Hobbies: reading, journaling and coloring.  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, manufacturing systems job has been impacted by current illness: No What is the longest time patient has a held a job?: 62yrs Where was the patient employed at that time?: [employer] [place] Has patient ever been in the [employer]?: No  Education: Education Is Patient Currently Attending School?: No Last Grade Completed: 12 Did Teacher, adult education From [employer]?: Yes Did You Have An Individualized Education Program (IIEP): No Did You Have Any Difficulty At [employer]?: No Patient's Education Has Been Impacted by Current Illness: No  CCA Family/Childhood History Family and Relationship History: Family history Marital status: Married Does patient have children?: Yes How many children?: 2 How is patient's relationship with their children?: good  Childhood History:  Childhood History Additional childhood history information: uta Description of patient's relationship with caregiver when they were a child: uta Patient's description of current relationship with people who raised him/her: uta How were you disciplined when you got in trouble as a child/adolescent?: uta Did patient suffer any verbal/emotional/physical/sexual abuse as a child?: No Did patient suffer from severe childhood neglect?: No Has patient ever been sexually abused/assaulted/raped as an adolescent or adult?: No Was the patient ever a victim of a crime or a disaster?: No Witnessed domestic violence?: No Has patient been affected by domestic violence as an adult?: No  Child/Adolescent Assessment:   CCA Substance Use Alcohol/Drug Use: Alcohol / Drug Use Pain Medications: see  MAR Prescriptions: see MAR Over the Counter: see MAR History of alcohol / drug use?: No history of alcohol / drug abuse   ASAM's:  Six Dimensions of Multidimensional Assessment  Dimension 1:  Acute Intoxication and/or Withdrawal Potential:      Dimension 2:  Biomedical Conditions and Complications:      Dimension 3:  Emotional, Behavioral, or Cognitive Conditions and Complications:     Dimension 4:  Readiness to Change:     Dimension 5:  Relapse, Continued use, or Continued Problem Potential:     Dimension 6:  Recovery/Living Environment:     ASAM Severity Score:    ASAM Recommended Level of Treatment:     Substance use Disorder (SUD)   Recommendations for Services/Supports/Treatments:   DSM5 Diagnoses: Patient Active Problem List   Diagnosis Date Noted  . Major depression 07/19/2017  . Bipolar disorder ([place]) 07/18/2017   Patient Centered Plan: Patient is on the following Treatment Plan(s):    Referrals to Alternative Service(s): Referred to Alternative Service(s):   Place:   Date:   Time:    Referred to Alternative Service(s):   Place:   Date:   Time:    Referred to Alternative Service(s):  Place:   Date:   Time:    Referred to Alternative Service(s):   Place:   Date:   Time:     Venora Maples, [facility]

## 2020-12-01 NOTE — ED Notes (Signed)
Shannon Kemp [phone]

## 2020-12-01 NOTE — ED Notes (Signed)
Lindon Romp, NP, patient does not meet inpatient criteria. Patient will follow up with outpatient resources faxed to patient.

## 2020-12-04 ENCOUNTER — Encounter: Payer: Self-pay | Admitting: Nurse Practitioner

## 2020-12-04 ENCOUNTER — Ambulatory Visit (INDEPENDENT_AMBULATORY_CARE_PROVIDER_SITE_OTHER): Payer: Managed Care, Other (non HMO) | Admitting: Nurse Practitioner

## 2020-12-04 ENCOUNTER — Encounter: Payer: Self-pay | Admitting: Gastroenterology

## 2020-12-04 VITALS — BP 126/72 | HR 70 | Ht 65.0 in | Wt 176.0 lb

## 2020-12-04 DIAGNOSIS — R1010 Upper abdominal pain, unspecified: Secondary | ICD-10-CM

## 2020-12-04 DIAGNOSIS — R142 Eructation: Secondary | ICD-10-CM

## 2020-12-04 NOTE — Progress Notes (Signed)
ASSESSMENT AND PLAN    # 45 yo female referred for abdominal pain. She has chronic generalized abdominal pain generally relieved with defecation but now recent development of RUQ pain radiating toward epigastrium with associated belching. Three recent ED visits. Lipase, liver chemistries, CBC, CT scan and CTA abd/ pelvis all unremarkable. She took NSAID on a regular basis until a few weeks ago, rule out PUD --Patient will be scheduled for EGD . The risks and benefits of EGD were discussed and the patient agrees to proceed.  --Do not resume NSAIDs for now, will need to see what EGD shows --Continue daily PPI --Gallbladder disease seems less likely but if EGD negative and pain persists consider RUQ ultrasound   # UTI diagnosed by ED. On Keflex. Culture wasn't done. She was asymptomatic at time of diagnosis.   HISTORY OF PRESENT ILLNESS     Primary Gastroenterologist : newJustice Britain, MD   Chief Complaint : belching, burning in chest swallowing problems, abdominal pain    Shannon Kemp is a 46 y.o. female with past medical history significant bipolar disorder, headaches, arthritis , urinary tract infections kidney stones and diverticulosis.   Patient is new to the practice and referred by PCP for abdominal pain.  Patien has seen her PCP and been to the ED three times within the last couple weeks for evaluation of abdominal pain, chest pain, nausea and vomiting. Her liver chemistries, lipase and CBC have been normal. CT abd / pelvis and CTA of chest / abdomen were both negative.    Patient describes generalized abdominal pain for years related to certain foods and relieved with defection. Dairy and greasy foods tend to cause her problems. Over the last few months she has had excessive intestinal gas and generalized mid to upper abdominal pain unrelieved with defecation. Pain starts in RUQ and radiates toward epigastrium. Pain is sharp and stabbing.  Initially when the pain  started it was associated with vomiting but she feels like that was more related to coughing / gagging. Vomiting has resolved.  The ED gave her Bentyl TID and it helps dull the pain but doesn't take it way. Patient has history of frequent NSAID use but none in last few weeks. She feels constipated but says that she isn't. She is having a BM daily or at least every other days. No blood in stool. She gives a family history of gallbladder disease.    Additionally patient gives a longstanding history of heartburn for which she used to take Tums. ED started her on Protonix which has helped heartburn. However she has lately been experiencing burning pain in her chest when she eats. She also describes occasionally dysphagia to meats   PREVIOUS EVALUATIONS:  11/24/2020 CT scan abdomen and pelvis with contrast --Right nephrolithiasis without obstruction . No acute findings.   --Chronic L5 pars fracture  11/28/20 CTA chest and abdomen 1. No evidence of acute aortic syndrome. 2. Left ovarian corpus luteal cyst with trace likely physiologic pelvic free fluid, which can be a cause of pelvic pain (Mittelschmerz). 3. Colonic diverticulosis without evidence of acute diverticulitis. 4. 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   Past Medical History:  Diagnosis Date  . Allergy   . Bruises easily   . Generalized headaches   . Pneumothorax   . Sleep difficulties      Past Surgical History:  Procedure Laterality Date  . BILATERAL OOPHORECTOMY    . LAPAROSCOPY  04/01/2012   Procedure: LAPAROSCOPY OPERATIVE;  Surgeon: Floyce Stakes. Shannon Hurry, MD;  Location: [facility];  Service: Gynecology;  Laterality: N/A;  Operative laparoscopy/right salpingectomy and removal of ectopic pregnancy  . LAPAROSCOPY Left 05/09/2013   Procedure: LAPAROSCOPY OPERATIVE/ECTOPIC;  Surgeon: Linda Hedges, DO;  Location: [facility];  Service: Gynecology;  Laterality: Left;  WITH LEFT SALPINGECTOMY  . ORIF  ULNAR FRACTURE    . PLEURAL SCARIFICATION     Family History  Problem Relation Age of Onset  . Hypertension Mother   . Diabetes Mother   . Osteoarthritis Mother   . Hypertension Father   . Cancer Father   . Stroke Father   . Osteoarthritis Father    Social History   Tobacco Use  . Smoking status: Former Research scientist (life sciences)  . Smokeless tobacco: Never Used  Vaping Use  . Vaping Use: Never used  Substance Use Topics  . Alcohol use: No  . Drug use: No   Current Outpatient Medications  Medication Sig Dispense Refill  . acetaminophen (TYLENOL) 325 MG tablet Take 650 mg by mouth every 6 (six) hours as needed for mild pain, fever or headache.    . ARIPiprazole (ABILIFY) 5 MG tablet Take 1 tablet (5 mg total) by mouth daily. 90 tablet 0  . cephALEXin (KEFLEX) 500 MG capsule Take 1 capsule (500 mg total) by mouth 3 (three) times daily for 10 days. 30 capsule 0  . dicyclomine (BENTYL) 20 MG tablet Take 1 tablet (20 mg total) by mouth 3 (three) times daily as needed. (Patient taking differently: Take 20 mg by mouth 3 (three) times daily as needed for spasms.) 20 tablet 0  . ibuprofen (ADVIL,MOTRIN) 200 MG tablet Take 800 mg by mouth every 6 (six) hours as needed for headache (leg pain).    . Multiple Vitamin (MULTIVITAMIN WITH MINERALS) TABS tablet Take 1 tablet by mouth daily.    . ondansetron (ZOFRAN ODT) 4 MG disintegrating tablet Take 1 tablet (4 mg total) by mouth every 8 (eight) hours as needed. (Patient taking differently: Take 4 mg by mouth every 8 (eight) hours as needed for nausea or vomiting.) 20 tablet 0  . pantoprazole (PROTONIX) 40 MG tablet Take 1 tablet (40 mg total) by mouth daily. 30 tablet 0  . polyvinyl alcohol (LIQUIFILM TEARS) 1.4 % ophthalmic solution Place 1 drop into both eyes as needed for dry eyes.    Marland Kitchen venlafaxine XR (EFFEXOR-XR) 75 MG 24 hr capsule Take 1 capsule (75 mg total) by mouth daily. For mood control 90 capsule 0  . Vitamin D, Ergocalciferol, (DRISDOL) 1.25 MG (50000  UNIT) CAPS capsule Take 50,000 Units by mouth once a week.     No current facility-administered medications for this visit.   Allergies  Allergen Reactions  . Morphine And Related Itching and Other (See Comments)    hallucinations  . Adhesive [Tape] Rash  . Latex Rash     Review of Systems: Positive for anxiety, back pain, vision changes, cough, depression, headaches, shortness of breath, sleeping problems.  All other systems reviewed and negative except where noted in HPI.   PHYSICAL EXAM :    Wt Readings from Last 3 Encounters:  11/30/20 174 lb (78.9 kg)  11/24/20 171 lb (77.6 kg)  05/18/20 181 lb (82.1 kg)    LMP 11/14/2020 Comment: tubaligation Constitutional:  Pleasant female in no acute distress. Psychiatric: Normal mood and affect. Behavior is normal. EENT: Pupils normal.  Conjunctivae are normal. No scleral icterus. Neck supple.  Cardiovascular: Normal rate,  regular rhythm. No edema Pulmonary/chest: Effort normal and breath sounds normal. No wheezing, rales or rhonchi. Abdominal: Soft, nondistended, nontender. Bowel sounds active throughout. There are no masses palpable. No hepatomegaly. Neurological: Alert and oriented to person place and time. Skin: Skin is warm and dry. No rashes noted.  Tye Savoy, NP  12/04/2020, 8:39 AM  Cc:  Referring Provider Bartholome Bill, MD

## 2020-12-04 NOTE — Patient Instructions (Addendum)
If you are age 43 or younger, your body mass index should be between 19-25. Your Body mass index is 29.29 kg/m. If this is out of the aformentioned range listed, please consider follow up with your Primary Care Provider.   PROCEDURES: You have been scheduled for a endoscopy. Please follow the written instructions given to you at your visit today. If you use inhalers (even only as needed), please bring them with you on the day of your procedure.  RECOMMENDATIONS:  Continue Pantoprazole.  Please Avoid all NSAID's. Some examples of NSAID's are as follows: Aspirin (Bufferin, Bayer, and Excedrin) Ibuprofen (Advil, Motrin, Nuprin) Ketoprofen (Actron, Orudis) Naproxen (Aleve) Daypro  Indocin  Lodine  Naprosyn  Relafen  Vimovo Voltaren   It was great seeing you today! Thank you for entrusting me with your care and choosing [facility].  Tye Savoy, NP

## 2020-12-05 NOTE — Progress Notes (Signed)
Attending Physician's Attestation   I have reviewed the chart.   I agree with the Advanced Practitioner's note, impression, and recommendations with any updates as below. Will proceed with expedited EGD/Colonoscopy.  Would recommend as well in workup to get a RUQ U/S to ensure no stone disease of gallbladder as it is more sensitive for stone disease than Cts.   Justice Britain, MD [facility] Advanced Endoscopy Office # [phone]

## 2020-12-08 ENCOUNTER — Ambulatory Visit (AMBULATORY_SURGERY_CENTER): Payer: Managed Care, Other (non HMO) | Admitting: Gastroenterology

## 2020-12-08 ENCOUNTER — Encounter: Payer: Self-pay | Admitting: Gastroenterology

## 2020-12-08 ENCOUNTER — Other Ambulatory Visit: Payer: Self-pay

## 2020-12-08 VITALS — BP 138/89 | HR 87 | Temp 97.2°F | Resp 13 | Ht 65.0 in | Wt 176.0 lb

## 2020-12-08 DIAGNOSIS — K921 Melena: Secondary | ICD-10-CM

## 2020-12-08 DIAGNOSIS — K225 Diverticulum of esophagus, acquired: Secondary | ICD-10-CM | POA: Diagnosis not present

## 2020-12-08 DIAGNOSIS — K2100 Gastro-esophageal reflux disease with esophagitis, without bleeding: Secondary | ICD-10-CM

## 2020-12-08 DIAGNOSIS — K449 Diaphragmatic hernia without obstruction or gangrene: Secondary | ICD-10-CM

## 2020-12-08 DIAGNOSIS — K2970 Gastritis, unspecified, without bleeding: Secondary | ICD-10-CM

## 2020-12-08 DIAGNOSIS — K209 Esophagitis, unspecified without bleeding: Secondary | ICD-10-CM

## 2020-12-08 DIAGNOSIS — R1010 Upper abdominal pain, unspecified: Secondary | ICD-10-CM

## 2020-12-08 MED ORDER — PANTOPRAZOLE SODIUM 40 MG PO TBEC: 40.0000 mg | DELAYED_RELEASE_TABLET | Freq: Two times a day (BID) | ORAL | 3 refills | Status: DC

## 2020-12-08 MED ORDER — SODIUM CHLORIDE 0.9 % IV SOLN: 500.0000 mL | INTRAVENOUS | Status: DC

## 2020-12-08 NOTE — Progress Notes (Signed)
Called to room to assist during endoscopic procedure.  Patient ID and intended procedure confirmed with present staff. Received instructions for my participation in the procedure from the performing physician.  

## 2020-12-08 NOTE — Op Note (Signed)
[place] Patient Name: Shannon Kemp Procedure Date: 12/08/2020 7:53 AM MRN: [id number] Endoscopist: Justice Britain , MD Age: 45 Referring MD:  Date of Birth: 09-Oct-1975 Gender: Female Account #: [id number] Procedure:                Upper GI endoscopy Indications:              Upper abdominal pain, Dyspepsia, Heartburn,                            Hematemesis Medicines:                Monitored Anesthesia Care Procedure:                Pre-Anesthesia Assessment:                           - Prior to the procedure, a History and Physical                            was performed, and patient medications and                            allergies were reviewed. The patient's tolerance of                            previous anesthesia was also reviewed. The risks                            and benefits of the procedure and the sedation                            options and risks were discussed with the patient.                            All questions were answered, and informed consent                            was obtained. Prior Anticoagulants: The patient has                            taken no previous anticoagulant or antiplatelet                            agents. ASA Grade Assessment: II - A patient with                            mild systemic disease. After reviewing the risks                            and benefits, the patient was deemed in                            satisfactory condition to undergo the procedure.  After obtaining informed consent, the endoscope was                            passed under direct vision. Throughout the                            procedure, the patient's blood pressure, pulse, and                            oxygen saturations were monitored continuously. The                            Endoscope was introduced through the mouth, and                            advanced to the second part of duodenum. The  upper                            GI endoscopy was accomplished without difficulty.                            The patient tolerated the procedure. Scope In: Scope Out: Findings:                 No gross mucosal lesions were noted in the proximal                            esophagus, in the mid esophagus and in the distal                            esophagus. Biopsies were taken with a cold forceps                            for histology.                           LA Grade A (one or more mucosal breaks less than 5                            mm, not extending between tops of 2 mucosal folds)                            esophagitis with no bleeding was found right at the                            gastroesophageal junction and proximal cardia.                           A widely patent and non-obstructing Schatzki ring                            was found at the gastroesophageal junction. This  was biopsied to disrupt the ring.                           After the rest of the EGD was complete, a guidewire                            was placed and the scope was withdrawn. Dilation                            was performed in the entire esophagus with a Savary                            dilator with no resistance at 18 mm. The dilation                            site was examined following endoscope reinsertion                            and showed no change.                           A 2 cm hiatal hernia was present.                           A medium non-bleeding diverticulum was found in the                            gastric fundus.                           Patchy mild inflammation characterized by erythema                            and granularity was found in the gastric body and                            in the gastric antrum.                           No other gross lesions were noted in the entire                            examined stomach. Biopsies were  taken with a cold                            forceps for histology and Helicobacter pylori                            testing.                           No gross lesions were noted in the duodenal bulb,  in the first portion of the duodenum and in the                            second portion of the duodenum. Biopsies were taken                            with a cold forceps for histology. Complications:            No immediate complications. Estimated Blood Loss:     Estimated blood loss was minimal. Impression:               - No gross mucosal lesions in esophagus. Biopsied.                           - LA Grade A esophagitis with no bleeding.                           - Widely patent and non-obstructing Schatzki ring.                            Disrupted via biopsy.                           - Esophageal dilation performed.                           - 2 cm hiatal hernia.                           - Fundal gastric diverticulum noted.                           - Mild gastritis. No other gross lesions in the                            stomach. Biopsied.                           - No gross lesions in the duodenal bulb, in the                            first portion of the duodenum and in the second                            portion of the duodenum. Biopsied. Recommendation:           - The patient will be observed post-procedure,                            until all discharge criteria are met.                           - Discharge patient to home.                           - Patient has a contact number available  for                            emergencies. The signs and symptoms of potential                            delayed complications were discussed with the                            patient. Return to normal activities tomorrow.                            Written discharge instructions were provided to the                            patient.                            - Dilation diet as per protocol.                           - Increase Protonix to 40 mg twice daily for next                            1-2 months then decrease back to once daily.                           - If dysphagia symptoms improve after dilation can                            consider repeat dilation in future but if not                            significantly improved, then query possible                            Manometry evaluation.                           - Consideration of repeat EGD to ensure healing of                            esophagitis (though only Grade A) is not                            unreasonable, but can be based on patient's                            symptoms.                           - I think reasonable to go ahead and get a RUQUS to                            evaluate the gallbladder and ensure no evidence  of                            cholelithiasis. If there is cholelithiasis and                            patient continues to have issues then query                            surgical referral. If negative for cholelithiasis                            then can consider CCK-HIDA.                           - May consider diagnostic colonoscopy in future                            depending on patient's symptoms and if bowel habit                            changes persist.                           - Follow up in clinic to be arranged.                           - Consideration of Carafate empiricially in future                            depending on how patient does can be considered for                            short trial.                           - The findings and recommendations were discussed                            with the patient.                           - The findings and recommendations were discussed                            with the patient's family. Justice Britain, MD 12/08/2020 8:42:06 AM

## 2020-12-08 NOTE — Progress Notes (Signed)
Pt's states no medical or surgical changes since previsit or office visit.  CM IV.

## 2020-12-08 NOTE — Patient Instructions (Signed)
Handouts provided on esophagitis, gastritis, hiatal hernia and post-dilation diet.   Increase Protonix to 40mg  twice daily for next 1-2 months then decrease back to once daily.   See Recommendations on procedure report for detailed information on plan of care.   YOU HAD AN ENDOSCOPIC PROCEDURE TODAY AT [facility]:   Refer to the procedure report that was given to you for any specific questions about what was found during the examination.  If the procedure report does not answer your questions, please call your gastroenterologist to clarify.  If you requested that your care partner not be given the details of your procedure findings, then the procedure report has been included in a sealed envelope for you to review at your convenience later.  YOU SHOULD EXPECT: Some feelings of bloating in the abdomen. Passage of more gas than usual.  Walking can help get rid of the air that was put into your GI tract during the procedure and reduce the bloating. If you had a lower endoscopy (such as a colonoscopy or flexible sigmoidoscopy) you may notice spotting of blood in your stool or on the toilet paper. If you underwent a bowel prep for your procedure, you may not have a normal bowel movement for a few days.  Please Note:  You might notice some irritation and congestion in your nose or some drainage.  This is from the oxygen used during your procedure.  There is no need for concern and it should clear up in a day or so.  SYMPTOMS TO REPORT IMMEDIATELY:   Following upper endoscopy (EGD)  Vomiting of blood or coffee ground material  New chest pain or pain under the shoulder blades  Painful or persistently difficult swallowing  New shortness of breath  Fever of 100F or higher  Black, tarry-looking stools  For urgent or emergent issues, a gastroenterologist can be reached at any hour by calling [phone]. Do not use MyChart messaging for urgent concerns.    DIET:  Post-dilation  diet: Clear Liquids for 2 hours (until 10:30am). Then a Soft diet (see handout) for the rest of today. If you are swallowing feels ok, you may return to your normal diet tomorrow.  Drink plenty of fluids but you should avoid alcoholic beverages for 24 hours.  ACTIVITY:  You should plan to take it easy for the rest of today and you should NOT DRIVE or use heavy machinery until tomorrow (because of the sedation medicines used during the test).    FOLLOW UP: Our staff will call the number listed on your records 48-72 hours following your procedure to check on you and address any questions or concerns that you may have regarding the information given to you following your procedure. If we do not reach you, we will leave a message.  We will attempt to reach you two times.  During this call, we will ask if you have developed any symptoms of COVID 19. If you develop any symptoms (ie: fever, flu-like symptoms, shortness of breath, cough etc.) before then, please call [phone].  If you test positive for Covid 19 in the 2 weeks post procedure, please call and report this information to [place].    If any biopsies were taken you will be contacted by phone or by letter within the next 1-3 weeks.  Please call us at [phone] if you have not heard about the biopsies in 3 weeks.    SIGNATURES/CONFIDENTIALITY: You and/or your care partner have signed paperwork which  will be entered into your electronic medical record.  These signatures attest to the fact that that the information above on your After Visit Summary has been reviewed and is understood.  Full responsibility of the confidentiality of this discharge information lies with you and/or your care-partner.

## 2020-12-08 NOTE — Progress Notes (Signed)
Report to PACU, RN, vss, BBS= Clear.  

## 2020-12-09 ENCOUNTER — Telehealth: Payer: Self-pay

## 2020-12-09 ENCOUNTER — Other Ambulatory Visit: Payer: Self-pay

## 2020-12-09 DIAGNOSIS — R1010 Upper abdominal pain, unspecified: Secondary | ICD-10-CM

## 2020-12-09 NOTE — Telephone Encounter (Signed)
Order has been entered for [place].  Schedulers will be calling the pt with an appt.  Appt with Dr Rush Landmark on 01/14/21 at 3:45 pm.  The pt has been advised

## 2020-12-10 ENCOUNTER — Telehealth: Payer: Self-pay | Admitting: *Deleted

## 2020-12-10 NOTE — Telephone Encounter (Signed)
  Follow up Call-  Call back number 12/08/2020  Post procedure Call Back phone  # [phone]  Permission to leave phone message Yes  Some recent data might be hidden     Patient questions:  Message left to call us if necessary.

## 2020-12-10 NOTE — Telephone Encounter (Signed)
Attempted f/u phone call. No answer. Left message. °

## 2020-12-15 ENCOUNTER — Encounter: Payer: Self-pay | Admitting: Gastroenterology

## 2020-12-16 ENCOUNTER — Other Ambulatory Visit: Payer: Self-pay

## 2020-12-16 ENCOUNTER — Ambulatory Visit (HOSPITAL_COMMUNITY)
Admission: RE | Admit: 2020-12-16 | Discharge: 2020-12-16 | Disposition: A | Discharge status: Home / Self Care | Payer: Managed Care, Other (non HMO) | Source: Ambulatory Visit | Attending: Gastroenterology | Admitting: Gastroenterology

## 2020-12-16 DIAGNOSIS — R1010 Upper abdominal pain, unspecified: Secondary | ICD-10-CM | POA: Insufficient documentation

## 2021-01-13 ENCOUNTER — Other Ambulatory Visit: Payer: Self-pay

## 2021-01-13 ENCOUNTER — Encounter (HOSPITAL_COMMUNITY): Payer: Self-pay | Admitting: Psychiatry

## 2021-01-13 ENCOUNTER — Telehealth (HOSPITAL_COMMUNITY): Payer: Self-pay | Admitting: Psychiatry

## 2021-01-13 ENCOUNTER — Telehealth (INDEPENDENT_AMBULATORY_CARE_PROVIDER_SITE_OTHER): Payer: 59 | Admitting: Psychiatry

## 2021-01-13 VITALS — Wt 179.0 lb

## 2021-01-13 DIAGNOSIS — F419 Anxiety disorder, unspecified: Secondary | ICD-10-CM | POA: Diagnosis not present

## 2021-01-13 DIAGNOSIS — F319 Bipolar disorder, unspecified: Secondary | ICD-10-CM | POA: Diagnosis not present

## 2021-01-13 MED ORDER — VENLAFAXINE HCL ER 37.5 MG PO CP24: 112.5000 mg | ORAL_CAPSULE | Freq: Every day | ORAL | 0 refills | Status: DC

## 2021-01-13 MED ORDER — HYDROXYZINE HCL 10 MG PO TABS
50.0000 mg | ORAL_TABLET | Freq: Every evening | ORAL | 0 refills | Status: DC | PRN
Start: 2021-01-13 — End: 2021-02-16

## 2021-01-13 MED ORDER — ARIPIPRAZOLE 5 MG PO TABS: 5.0000 mg | ORAL_TABLET | Freq: Every day | ORAL | 0 refills | Status: DC
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
## 2021-01-13 NOTE — Telephone Encounter (Signed)
D:  Dr. Adele Schilder referred pt to [facility].  A:  Placed call and oriented pt.  Encouraged pt to verify her benefits.  [facility] scheduled for tomorrow at 9:45 a.m and possibly start on 01-15-21.  Inform Dr. Adele Schilder. R:  Pt receptive.

## 2021-01-14 ENCOUNTER — Other Ambulatory Visit (HOSPITAL_COMMUNITY): Payer: 59 | Attending: Psychiatry | Admitting: Psychiatry

## 2021-01-14 ENCOUNTER — Ambulatory Visit (INDEPENDENT_AMBULATORY_CARE_PROVIDER_SITE_OTHER): Payer: Managed Care, Other (non HMO) | Admitting: Gastroenterology

## 2021-01-14 ENCOUNTER — Other Ambulatory Visit: Payer: Self-pay

## 2021-01-14 VITALS — BP 114/94 | HR 72 | Ht 65.0 in | Wt 178.4 lb

## 2021-01-14 DIAGNOSIS — Z62810 Personal history of physical and sexual abuse in childhood: Secondary | ICD-10-CM | POA: Insufficient documentation

## 2021-01-14 DIAGNOSIS — R198 Other specified symptoms and signs involving the digestive system and abdomen: Secondary | ICD-10-CM | POA: Diagnosis not present

## 2021-01-14 DIAGNOSIS — Z79899 Other long term (current) drug therapy: Secondary | ICD-10-CM | POA: Insufficient documentation

## 2021-01-14 DIAGNOSIS — R1310 Dysphagia, unspecified: Secondary | ICD-10-CM

## 2021-01-14 DIAGNOSIS — F411 Generalized anxiety disorder: Secondary | ICD-10-CM | POA: Insufficient documentation

## 2021-01-14 DIAGNOSIS — K449 Diaphragmatic hernia without obstruction or gangrene: Secondary | ICD-10-CM | POA: Diagnosis not present

## 2021-01-14 DIAGNOSIS — F319 Bipolar disorder, unspecified: Secondary | ICD-10-CM | POA: Insufficient documentation

## 2021-01-14 DIAGNOSIS — R45851 Suicidal ideations: Secondary | ICD-10-CM | POA: Insufficient documentation

## 2021-01-14 DIAGNOSIS — Z9151 Personal history of suicidal behavior: Secondary | ICD-10-CM | POA: Insufficient documentation

## 2021-01-14 DIAGNOSIS — R1030 Lower abdominal pain, unspecified: Secondary | ICD-10-CM

## 2021-01-14 DIAGNOSIS — Z87891 Personal history of nicotine dependence: Secondary | ICD-10-CM | POA: Insufficient documentation

## 2021-01-14 DIAGNOSIS — R0989 Other specified symptoms and signs involving the circulatory and respiratory systems: Secondary | ICD-10-CM

## 2021-01-14 DIAGNOSIS — K2100 Gastro-esophageal reflux disease with esophagitis, without bleeding: Secondary | ICD-10-CM | POA: Diagnosis not present

## 2021-01-14 NOTE — Progress Notes (Signed)
globus
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
## 2021-01-14 NOTE — Patient Instructions (Signed)
You have been scheduled for a modified barium swallow on __________________________ at __________________________. Please arrive 15 minutes prior to your test for registration. You will go to ________________ Radiology (1st Floor) for your appointment. Should you need to cancel or reschedule your appointment, please contact [phone] [facility] [place]) or [phone] [place]). _____________________________________________________________________ A Modified Barium Swallow Study, or MBS, is a special x-ray that is taken to check swallowing skills. It is carried out by a Stage manager and a Psychologist, clinical (SLP). During this test, yourmouth, throat, and esophagus, a muscular tube which connects your mouth to your stomach, is checked. The test will help you, your doctor, and the SLP plan what types of foods and liquids are easier for you to swallow. The SLP will also identify positions and ways to help you swallow more easily and safely. What will happen during an MBS? You will be taken to an x-ray room and seated comfortably. You will be asked to swallow small amounts of food and liquid mixed with barium. Barium is a liquid or paste that allows images of your mouth, throat and esophagus to be seen on x-ray. The x-ray captures moving images of the food you are swallowing as it travels from your mouth through your throat and into your esophagus. This test helps identify whether food or liquid is entering your lungs (aspiration). The test also shows which part of your mouth or throat lacks strength or coordination to move the food or liquid in the right direction. This test typically takes 30 minutes to 1 hour to complete. _____________________________________________________________   Continue Pantoprazole  40mg  twice daily until May 2022, then decrease to  Once daily.   Start : A high fiber diet with plenty of fluids (up to 8 glasses of water daily) is suggested to relieve these symptoms.   Metamucil, 1 tablespoon once or twice daily can be used to keep bowels regular if needed.  Follow-up in 3 month- office will schedule at a later time.   Thank you for choosing me and [facility].  Dr. Rush Landmark

## 2021-01-15 ENCOUNTER — Other Ambulatory Visit (HOSPITAL_COMMUNITY): Payer: 59 | Admitting: Psychiatry

## 2021-01-15 ENCOUNTER — Encounter (HOSPITAL_COMMUNITY): Payer: Self-pay | Admitting: Psychiatry

## 2021-01-15 ENCOUNTER — Other Ambulatory Visit: Payer: Self-pay

## 2021-01-15 DIAGNOSIS — F319 Bipolar disorder, unspecified: Secondary | ICD-10-CM

## 2021-01-15 DIAGNOSIS — Z9151 Personal history of suicidal behavior: Secondary | ICD-10-CM | POA: Diagnosis not present

## 2021-01-15 DIAGNOSIS — Z62810 Personal history of physical and sexual abuse in childhood: Secondary | ICD-10-CM | POA: Diagnosis not present

## 2021-01-15 DIAGNOSIS — F411 Generalized anxiety disorder: Secondary | ICD-10-CM | POA: Diagnosis not present

## 2021-01-15 DIAGNOSIS — Z79899 Other long term (current) drug therapy: Secondary | ICD-10-CM | POA: Diagnosis not present

## 2021-01-15 DIAGNOSIS — Z87891 Personal history of nicotine dependence: Secondary | ICD-10-CM | POA: Diagnosis not present

## 2021-01-15 DIAGNOSIS — R45851 Suicidal ideations: Secondary | ICD-10-CM | POA: Diagnosis not present

## 2021-01-15 NOTE — Progress Notes (Signed)
Virtual Visit via Video Note  I connected with Shannon Kemp on 01/15/21 at  9:00 AM EDT by a video enabled telemedicine application and verified that I am speaking with the correct person using two identifiers.  At orientation to the IOP program, Case Manager discussed the limitations of evaluation and management by telemedicine and the availability of in person appointments. The patient expressed understanding and agreed to proceed with virtual visits throughout the duration of the program.   Location:  Patient: Patient Home Provider: Home Office   History of Present Illness: Bipolar 1 DO  Observations/Objective: Check In: Case Manager checked in with all participants to review discharge dates, insurance authorizations, work-related documents and needs from the treatment team regarding medications. Client stated needs and engaged in discussion. Case Manager reminded of contact and crisis numbers, and plan for program coverage next week. Case Manager introduced a new Client to the group, with group members welcoming and starting the joining process.   Initial Therapeutic Activity: Counselor facilitated a check-in with group members to assess mood and current functioning. Counselor facilitated group discussion on following topics, sharing coping skills and strategies to address: relational challenges, boundary setting, assertive communication, post-partum depression, social anxiety, familial and generational patterns. Client presents with moderate depression and moderate anxiety. Client engaged in discussion and provided feedback for other group members. Client denied any current SI/HI/psychosis.   Second Therapeutic Activity: Counselor prompted group members to share needs and recommendations for topics to cover in upcoming sessions to best address current mental health challenges. Group Members each shared their ideas, including time management, discharge planning, coping skills, managing  work stress, vulnerability, authenticity, and grief and loss. Client engaged well in discussion.    Check Out: Counselor prompted group members to choose a productivity activity or self-care practice to engage in over the weekend. Client plans to spend quality time with family. Client endorsed safety plan to be followed to prevent safety issues.   Assessment and Plan: Clinician recommends that Client remain in IOP treatment to better manage mental health symptoms, stabilization and to address treatment plan goals. Clinician recommends adherence to crisis/safety plan, taking medications as prescribed, and following up with medical professionals if any issues arise.   Follow Up Instructions: Clinician will send Webex link for next session. The Client was advised to call back or seek an in-person evaluation if the symptoms worsen or if the condition fails to improve as anticipated.     I provided 180 minutes of non-face-to-face time during this encounter.     Lise Auer, LCSW
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
## 2021-01-18 ENCOUNTER — Other Ambulatory Visit (HOSPITAL_COMMUNITY): Payer: 59 | Admitting: Licensed Clinical Social Worker

## 2021-01-18 ENCOUNTER — Other Ambulatory Visit: Payer: Self-pay

## 2021-01-18 ENCOUNTER — Encounter: Payer: Self-pay | Admitting: Gastroenterology

## 2021-01-18 DIAGNOSIS — R1030 Lower abdominal pain, unspecified: Secondary | ICD-10-CM | POA: Insufficient documentation

## 2021-01-18 DIAGNOSIS — K449 Diaphragmatic hernia without obstruction or gangrene: Secondary | ICD-10-CM | POA: Insufficient documentation

## 2021-01-18 DIAGNOSIS — K2100 Gastro-esophageal reflux disease with esophagitis, without bleeding: Secondary | ICD-10-CM | POA: Insufficient documentation

## 2021-01-18 DIAGNOSIS — F319 Bipolar disorder, unspecified: Secondary | ICD-10-CM | POA: Diagnosis not present

## 2021-01-18 DIAGNOSIS — R1310 Dysphagia, unspecified: Secondary | ICD-10-CM | POA: Insufficient documentation

## 2021-01-18 DIAGNOSIS — R198 Other specified symptoms and signs involving the digestive system and abdomen: Secondary | ICD-10-CM | POA: Insufficient documentation

## 2021-01-18 DIAGNOSIS — R0989 Other specified symptoms and signs involving the circulatory and respiratory systems: Secondary | ICD-10-CM | POA: Insufficient documentation

## 2021-01-18 NOTE — Progress Notes (Signed)
[facility] VISIT   Primary Care Provider Bartholome Bill, MD [place] [address] [phone]  Patient Profile: Shannon Kemp is a 45 y.o. female with a pmh significant for Allergies, Headaches, Bipolar Disorder with MDD, GERD with esophagitis, Hiatal hernia.  The patient presents to the [facility] for an evaluation and management of problem(s) noted below:  Problem List 1. Globus sensation   2. Dysphagia, unspecified type   3. Gastroesophageal reflux disease with esophagitis, unspecified whether hemorrhage   4. Hiatal hernia   5. Lower abdominal pain     History of Present Illness Please see initial consultation note by NP [facility] for full details of HPI.  Interval History The patient returns for scheduled follow up.  Still having some symptoms of GERD.  Better than prior now that she is monitoring her food intake.  Dysphagia persists but is improved somewhat.  Has more globus sensation with pills.  Still having lower abdominal discomfort that improves after defecation.  Previous RUQ pain has improved.  No blood in stools.  Continues on current PPI dosing.  GI Review of Systems Positive as above Negative for odynophagia, nausea, vomiting, change in bowel habits, melena, hematochezia  Review of Systems General: Denies fevers/chills/weight loss unintentionally Cardiovascular: Denies chest pain Pulmonary: Denies shortness of breath Gastroenterological: See HPI Genitourinary: Denies darkened urine Hematological: Denies easy bruising/bleeding Endocrine: Denies temperature intolerance Dermatological: Denies jaundice Psychological: Mood is stable   Medications Current Outpatient Medications  Medication Sig Dispense Refill  . acetaminophen (TYLENOL) 325 MG tablet Take 650 mg by mouth every 6 (six) hours as needed for mild pain, fever or headache.    . ARIPiprazole (ABILIFY) 5 MG  tablet Take 1 tablet (5 mg total) by mouth daily. 90 tablet 0  . hydrOXYzine (ATARAX/VISTARIL) 10 MG tablet Take 5 tablets (50 mg total) by mouth at bedtime as needed for anxiety. 30 tablet 0  . Multiple Vitamin (MULTIVITAMIN WITH MINERALS) TABS tablet Take 1 tablet by mouth daily.    . ondansetron (ZOFRAN ODT) 4 MG disintegrating tablet Take 1 tablet (4 mg total) by mouth every 8 (eight) hours as needed. 20 tablet 0  . pantoprazole (PROTONIX) 40 MG tablet Take 1 tablet (40 mg total) by mouth daily. (Patient taking differently: Take 40 mg by mouth 2 (two) times daily.) 30 tablet 0  . polyvinyl alcohol (LIQUIFILM TEARS) 1.4 % ophthalmic solution Place 1 drop into both eyes as needed for dry eyes.    Marland Kitchen venlafaxine XR (EFFEXOR-XR) 37.5 MG 24 hr capsule Take 3 capsules (112.5 mg total) by mouth daily. For mood control 90 capsule 0  . Vitamin D, Ergocalciferol, (DRISDOL) 1.25 MG (50000 UNIT) CAPS capsule Take 50,000 Units by mouth once a week.    . dicyclomine (BENTYL) 20 MG tablet Take 1 tablet (20 mg total) by mouth 3 (three) times daily as needed. 20 tablet 0   No current facility-administered medications for this visit.    Allergies Allergies  Allergen Reactions  . Morphine And Related Itching and Other (See Comments)    hallucinations  . Adhesive [Tape] Rash  . Latex Rash    Histories Past Medical History:  Diagnosis Date  . Allergy   . Breast pain   . Bruises easily   . Fatigue   . Generalized headaches   . Nipple discharge   . Pneumothorax   . Sleep difficulties   . Weight increase    Past Surgical History:  Procedure Laterality Date  . BILATERAL OOPHORECTOMY    . LAPAROSCOPY  04/01/2012   Procedure: LAPAROSCOPY OPERATIVE;  Surgeon: Claiborne Billings A. Pamala Hurry, MD;  Location: [facility];  Service: Gynecology;  Laterality: N/A;  Operative laparoscopy/right salpingectomy and removal of ectopic pregnancy  . LAPAROSCOPY Left 05/09/2013   Procedure: LAPAROSCOPY OPERATIVE/ECTOPIC;  Surgeon: Linda Hedges, DO;  Location: [facility];  Service: Gynecology;  Laterality: Left;  WITH LEFT SALPINGECTOMY  . ORIF ULNAR FRACTURE    . PLEURAL SCARIFICATION     Social History   Socioeconomic History  . Marital status: Married    Spouse name: Not on file  . Number of children: Not on file  . Years of education: Not on file  . Highest education level: Not on file  Occupational History  . Not on file  Tobacco Use  . Smoking status: Former Research scientist (life sciences)  . Smokeless tobacco: Never Used  Vaping Use  . Vaping Use: Never used  Substance and Sexual Activity  . Alcohol use: No  . Drug use: No  . Sexual activity: Not Currently    Birth control/protection: Surgical  Other Topics Concern  . Not on file  Social History Narrative   Pt lives in single story home with her husband and son   Has 2 children   12th grade education, will be starting online college soon   Social Determinants of Radio broadcast assistant Strain: Not on file  Food Insecurity: Not on file  Transportation Needs: Not on file  Physical Activity: Not on file  Stress: Not on file  Social Connections: Not on file  Intimate Partner Violence: Not on file   Family History  Problem Relation Age of Onset  . Hypertension Mother   . Diabetes Mother   . Osteoarthritis Mother   . Hypertension Father   . Cancer Father   . Stroke Father   . Osteoarthritis Father   . Stomach cancer Paternal Aunt   . Esophageal cancer Neg Hx   . Rectal cancer Neg Hx   . Inflammatory bowel disease Neg Hx   . Liver disease Neg Hx   . Pancreatic cancer Neg Hx    I have reviewed her medical, social, and family history in detail and updated the electronic medical record as necessary.    PHYSICAL EXAMINATION  BP (!) 114/94 (BP Location: Left Arm, Patient Position: Sitting, Cuff Size: Normal)   Pulse 72   Ht 5\' 5"  (1.651 m)   Wt 178 lb 6 oz (80.9 kg)   BMI 29.68 kg/m  Wt Readings from Last 3 Encounters:  01/14/21 178 lb 6 oz (80.9 kg)  12/08/20  176 lb (79.8 kg)  12/04/20 176 lb (79.8 kg)  GEN: NAD, appears stated age, doesn't appear chronically ill PSYCH: Cooperative, without pressured speech EYE: Conjunctivae pink, sclerae anicteric ENT: Masked CV: Nontachycardic RESP: No audible wheezing GI: NABS, soft, NT/ND, without rebound or guarding MSK/EXT: No LE edema SKIN: No jaundice NEURO:  Alert & Oriented x 3, no focal deficits   REVIEW OF DATA  I reviewed the following data at the time of this encounter:  GI Procedures and Studies  March 2022 EGD - No gross mucosal lesions in esophagus. Biopsied. - LA Grade A esophagitis with no bleeding. - Widely patent and non-obstructing Schatzki ring. Disrupted via biopsy. - Esophageal dilation performed. - 2 cm hiatal hernia. - Fundal gastric diverticulum noted. - Mild gastritis. No other gross lesions in the stomach. Biopsied. - No gross lesions in the  duodenal bulb, in the first portion of the duodenum and in the second portion of the duodenum. Biopsied.  Pathology Diagnosis 1. Surgical [P], duodenum - DUODENAL MUCOSA WITH NO SIGNIFICANT PATHOLOGIC FINDINGS. - NEGATIVE FOR INCREASED INTRAEPITHELIAL LYMPHOCYTES AND VILLOUS ARCHITECTURAL CHANGES. 2. Surgical [P], random gastric - MILD CHRONIC GASTRITIS. - WARTHIN-STARRY STAIN IS NEGATIVE FOR HELICOBACTER PYLORI. 3. Surgical [P], random esophagus - SQUAMOUS ESOPHAGEAL EPITHELIUM WITH NO SIGNIFICANT PATHOLOGIC FINDINGS. - NEGATIVE FOR INCREASED INTRAEPITHELIAL EOSINOPHILS. 4. Surgical [P], esophagus GE junction - GASTROESOPHAGEAL JUNCTION MUCOSA WITH REACTIVE/REGENERATIVE CHANGES. - NEGATIVE FOR SIGNIFICANT INFLAMMATORY COMPONENT, EOSINOPHILIC OR OTHERWISE. - NEGATIVE FOR INTESTINAL METAPLASIA (GOBLET CELL METAPLASIA).  Laboratory Studies  Reviewed those in epic  Imaging Studies  No new imaging studies to review   ASSESSMENT  Ms. Casalino is a 45 y.o. female with a pmh significant for Allergies, Headaches, Bipolar Disorder  with MDD, GERD with esophagitis, Hiatal hernia.  The patient is seen today for evaluation and management of:  1. Globus sensation   2. Dysphagia, unspecified type   3. Gastroesophageal reflux disease with esophagitis, unspecified whether hemorrhage   4. Hiatal hernia   5. Lower abdominal pain    Patient is hemodynamically stable.  Clinically somewhat improved though not completely from initial consultation and EGD procedure.  More symptoms of globus-like issues rather than dysphagia.  Will continue PPI therapy.  She will increase her fiber intake.  She seems to have IBS symptoms as well.     PLAN  Proceed with SLP evaluation  Consider repeat EGD in the future Maintain Protonix 40 mg twice daily until May 2022 then decrease to once daily High Fiber diet Metamucil 1-2 times daily Holding on Manometry at this time F/U in 62-months Consider TCA in future with Psychiatry input if pain becomes more significant issue   Orders Placed This Encounter  Procedures  . SLP modified barium swallow    New Prescriptions   No medications on file   Modified Medications   No medications on file    Planned Follow Up No follow-ups on file.   Total Time in Face-to-Face and in Coordination of Care for patient including independent/personal interpretation/review of prior testing, medical history, examination, medication adjustment, communicating results with the patient directly, and documentation with the EHR is 25 minutes.  Justice Britain, MD [facility] Office # [phone]

## 2021-01-19 ENCOUNTER — Other Ambulatory Visit: Payer: Self-pay

## 2021-01-19 ENCOUNTER — Other Ambulatory Visit (HOSPITAL_COMMUNITY): Payer: 59

## 2021-01-19 ENCOUNTER — Other Ambulatory Visit (HOSPITAL_COMMUNITY): Payer: Self-pay | Admitting: *Deleted

## 2021-01-19 DIAGNOSIS — R1310 Dysphagia, unspecified: Secondary | ICD-10-CM

## 2021-01-20 ENCOUNTER — Other Ambulatory Visit: Payer: Self-pay

## 2021-01-20 ENCOUNTER — Other Ambulatory Visit (HOSPITAL_COMMUNITY): Payer: 59

## 2021-01-21 ENCOUNTER — Other Ambulatory Visit (HOSPITAL_COMMUNITY): Payer: 59

## 2021-01-21 ENCOUNTER — Other Ambulatory Visit: Payer: Self-pay

## 2021-01-22 ENCOUNTER — Telehealth (HOSPITAL_COMMUNITY): Payer: Self-pay | Admitting: Licensed Clinical Social Worker

## 2021-01-22 ENCOUNTER — Other Ambulatory Visit (HOSPITAL_COMMUNITY): Payer: 59

## 2021-01-22 ENCOUNTER — Other Ambulatory Visit: Payer: Self-pay

## 2021-01-25 ENCOUNTER — Other Ambulatory Visit: Payer: Self-pay

## 2021-01-25 ENCOUNTER — Other Ambulatory Visit (HOSPITAL_COMMUNITY): Payer: 59 | Admitting: Psychiatry

## 2021-01-25 ENCOUNTER — Telehealth (HOSPITAL_COMMUNITY): Payer: Self-pay | Admitting: Psychiatry

## 2021-01-26 ENCOUNTER — Other Ambulatory Visit (HOSPITAL_COMMUNITY): Payer: 59 | Admitting: Psychiatry

## 2021-01-26 ENCOUNTER — Other Ambulatory Visit: Payer: Self-pay

## 2021-01-26 ENCOUNTER — Telehealth (HOSPITAL_COMMUNITY): Payer: Self-pay | Admitting: Psychiatry

## 2021-01-26 NOTE — Telephone Encounter (Signed)
D:  Patient didn't attend virtual MH-IOP nor did she call the case manager.  A: Placed call to pt and left her a vm strongly encouraging her to contact case manager.  Inform Dr. Adele Schilder and Ricky Ala, NP again today.  Will possibly resend short term disability if Dr. Adele Schilder and Ricky Ala, NP approves.

## 2021-01-27 ENCOUNTER — Other Ambulatory Visit (HOSPITAL_COMMUNITY): Payer: 59

## 2021-01-27 ENCOUNTER — Other Ambulatory Visit: Payer: Self-pay

## 2021-01-28 ENCOUNTER — Other Ambulatory Visit: Payer: Self-pay

## 2021-01-28 ENCOUNTER — Other Ambulatory Visit (HOSPITAL_COMMUNITY): Payer: 59

## 2021-01-28 ENCOUNTER — Telehealth (HOSPITAL_COMMUNITY): Payer: Self-pay | Admitting: Psychiatry

## 2021-01-28 NOTE — Progress Notes (Signed)
  Virtual Visit via Telephone Note  I connected with Shannon Kemp on @TODAY @ at  9:00 AM EDT by telephone and verified that I am speaking with the correct person using two identifiers.  Location: Patient: at home Provider: at office   I discussed the limitations, risks, security and privacy concerns of performing an evaluation and management service by telephone and the availability of in person appointments. I also discussed with the patient that there may be a patient responsible charge related to this service. The patient expressed understanding and agreed to proceed.   I discussed the assessment and treatment plan with the patient. The patient was provided an opportunity to ask questions and all were answered. The patient agreed with the plan and demonstrated an understanding of the instructions.   The patient was advised to call back or seek an in-person evaluation if the symptoms worsen or if the condition fails to improve as anticipated.  I provided 10 minutes of non-face-to-face time during this encounter.   Dellia Nims, M.Ed,CNA  Patient ID: Shannon Kemp, female   DOB: 1976-09-26, 45 y.o.   MRN: [id number] Intake/Chief Complaint:  This is a 45 yr old, employed, married, Caucasian female who was referred per Dr. Adele Schilder treatment for worsening depressive/anxiety symptoms.  Stressor/Trigger:  1) Medical:  Dx with a kidney stone, hiatal hernia, narrowing of esophagus and she ruptured her ovarian cyst.  Reports increased stomach pain, neck pain and difficulty eating and swallowing.  Pt has been out of work a lot d/t medical issues.  Pt admits to one prior admit at [facility] (inpt unit) d/t cutting wrists in 2018.  Reports her husband and her two kids are her support system.  Current Symptoms/Problems: Sadness, anxiety, poor concentration, difficulty falling asleep, fair appetite (eating smaller meals), no energy, isolated, irritable, ruminating thoughts, decreased ADL's (ie. hair  hasn't been washed over a week), decreased self esteem  Pt only attended virtual MH-IOP two days (April 15 and 18th); pt never returned nor did she call case manager back.  Last day in group, the group leader Sonia Baller [place], [place]) states she was very active.  Will go ahead and discharge pt today d/t non-compliancy with attendance.  A:  D/C today (01-28-21).  F/U with Dr. Adele Schilder and therapist of choice.  Inform treatment team and Dr. Adele Schilder.  Dellia Nims, M.Ed,CNA

## 2021-01-28 NOTE — Patient Instructions (Signed)
D:  Discharge patient due to non-compliancy with treatment.  A:  Follow up with Dr.Arfeen and therapist of choice.

## 2021-01-29 ENCOUNTER — Encounter (HOSPITAL_COMMUNITY): Payer: Self-pay | Admitting: Family

## 2021-01-29 ENCOUNTER — Other Ambulatory Visit (HOSPITAL_COMMUNITY): Payer: 59 | Admitting: Family

## 2021-01-29 ENCOUNTER — Other Ambulatory Visit: Payer: Self-pay

## 2021-01-29 DIAGNOSIS — F419 Anxiety disorder, unspecified: Secondary | ICD-10-CM

## 2021-01-29 DIAGNOSIS — F319 Bipolar disorder, unspecified: Secondary | ICD-10-CM

## 2021-01-29 NOTE — Progress Notes (Unsigned)
  [facility] Discharge Summary  Shannon Kemp [id number]  Admission date: 01/15/2021 Discharge date: 01/29/2021  Reason for admission: Per admission assessment note: Shannon Kemp is a 45 year old Caucasian female with a charted history of Bipolar, Depression and  Generalized anxiety disorder.  Reports due to her declining health dealing with IBS, kidney stones and ovarian cyst.  States pain has become unbearable. reported due to multiple medical issues this has caused passive suicidal ideations.  Reports recent medication adjustment from Abilify to Effexor.  States she is currently now prescribed Effexor 112.5 daily.  States taking hydroxyzine 4 times daily due to "intense" anxiety. Shannon Kemp reports mood irritability, decreased motivation.  Passive suicidal ideations.  Denied plan or intent currently.     Progress in Program Toward Treatment Goals: [name] attended and participated sporadically.  Patient is discharged due to noncompliance with attendance.  Patient to be referred back to attending psychiatrist.  Multiple attempts to reach patient.  Without success.  Progress (rationale): Keep follow-up with Psychiatrist Afreen.  Take all medications as prescribed. Keep all follow-up appointments as scheduled.  Do not consume alcohol or use illegal drugs while on prescription medications. Report any adverse effects from your medications to your primary care provider promptly.  In the event of recurrent symptoms or worsening symptoms, call 911, a crisis hotline, or go to the nearest emergency department for evaluation.   Derrill Center, NP 01/29/2021

## 2021-02-02 ENCOUNTER — Telehealth (HOSPITAL_COMMUNITY): Payer: Self-pay

## 2021-02-02 NOTE — Telephone Encounter (Addendum)
I completed paperwork for patient to take her out of work, per dr, from 01/13/21 to approx 02/08/21 in order for pt to complete the IOP Program. Since I received the first set of paperwork, we have also received paperowrk for Short-Term Disabliity. However, I spoke with Velva Harman and pt has been discharged from the program due to noncompliance with attendance. She attended and participated sporadically. Therefore, Rita's thoughts are to rescind this patient's paperwork. Please review and advise. Thank you   Paperwork has been completed and sent out to patient

## 2021-02-02 NOTE — Telephone Encounter (Signed)
Please call patient to clarify about attend ence and STD. Thanks

## 2021-02-02 NOTE — Telephone Encounter (Signed)
I have clarified with Velva Harman that this patient has been discharged from IOP due to non-compliance with attendance. What would you like me to do with the STD paperwork we received?

## 2021-02-03 ENCOUNTER — Other Ambulatory Visit: Payer: Self-pay

## 2021-02-03 ENCOUNTER — Ambulatory Visit (HOSPITAL_COMMUNITY)
Admission: RE | Admit: 2021-02-03 | Discharge: 2021-02-03 | Disposition: A | Discharge status: Home / Self Care | Payer: Managed Care, Other (non HMO) | Source: Ambulatory Visit | Attending: Gastroenterology | Admitting: Gastroenterology

## 2021-02-03 DIAGNOSIS — R09A2 Foreign body sensation, throat: Secondary | ICD-10-CM

## 2021-02-03 DIAGNOSIS — K2100 Gastro-esophageal reflux disease with esophagitis, without bleeding: Secondary | ICD-10-CM | POA: Diagnosis present

## 2021-02-03 DIAGNOSIS — R1310 Dysphagia, unspecified: Secondary | ICD-10-CM | POA: Diagnosis present

## 2021-02-03 DIAGNOSIS — R1030 Lower abdominal pain, unspecified: Secondary | ICD-10-CM | POA: Insufficient documentation

## 2021-02-03 DIAGNOSIS — K449 Diaphragmatic hernia without obstruction or gangrene: Secondary | ICD-10-CM | POA: Insufficient documentation

## 2021-02-03 DIAGNOSIS — R198 Other specified symptoms and signs involving the digestive system and abdomen: Secondary | ICD-10-CM | POA: Insufficient documentation

## 2021-02-03 DIAGNOSIS — R0989 Other specified symptoms and signs involving the circulatory and respiratory systems: Secondary | ICD-10-CM

## 2021-02-03 NOTE — Progress Notes (Incomplete)
   02/03/21 1500  SLP Visit Information  SLP Received On 02/03/21  Subjective  Subjective pt awake sitting in chair in room during session  Patient/Family Stated Goal MD wanted to make sure swallowing in throat is ok per pt  Pain Assessment  Pain Assessment No/denies pain  General Information  Date of Onset 02/03/21  HPI 44 yo female referred by Dr Rush Landmark for OP MBS to rule out oropharyngeal dysphagia.  Pt with PMH + for Bipolar disease with MDD, GERD, allergies, headaches and is taking Abilify, Vistaril, Protonix, Effexor and Bentyl.  She is s/p EGD March 2022 showing esophagitis, Nonobstructing Schatzki's ring, 2 cm hiatal hernia and mild gastritis.  In addition, concerns for potential IBS present and pt is taking Bentyl.  Pt was put on a PPI BID after her endoscopy and reports improvement with odynophagia but continues to report sensing something sticking in her throat when eating foods.  She denies weight loss, pneumonias nor requiring heimlich manuever.  Pt has also modified her eating behavior by decreasing gluten, taking small bites, masticating well, drinking liquids during meal which is improving her symptoms.  Pt admits to some tremoring when starting on Bipolar medication but states the specific medication was stopped and tremoring stopped.  Type of Study MBS-Modified Barium Swallow Study  Previous Swallow Assessment see HPI  Diet Prior to this Study Regular  Temperature Spikes Noted No  Respiratory Status Room air  History of Recent Intubation No  Behavior/Cognition Alert  Oral Cavity Assessment WFL  Oral Care Completed by SLP No  Oral Cavity - Dentition Adequate natural dentition  Vision Functional for self feeding  Self-Feeding Abilities Able to feed self  Patient Positioning  (standing)  Baseline Vocal Quality Normal  Volitional Cough Strong  Volitional Swallow Able to elicit  [facility]  Pharyngeal Secretions Not observed secondary MBS  Oral Motor/Sensory Function   Overall Oral Motor/Sensory Function WFL  Oral Preparation/Oral Phase  Oral Phase WFL  Pharyngeal Phase  Pharyngeal Phase WFL  Cervical Esophageal Phase  Cervical Esophageal Phase [facility]  Clinical Impression  Clinical Impression Pt with normal oropharyngeal swallow ability without retention nor penetration/aspiration. Swallow is strong and timely.  Symptoms were replicated during testing - with pt reporting sensation of pudding and cracker lodging in throat - when pharynx was clear.  Upon esophageal sweep, barium cleared - radiologist not present to confirm findings.  RSI (reflux symptom index) administered to pt with her scoring 23/45 - which per authors Dr Grover Canavan, et al is highly indicative of LPR.   Pt was tested with thin, nectar, pudding, cracker and barium tablet.  She was also given boluses of pudding and cracker to consume without xray in hopes to provide enough intake to elicit symptoms.  Thanks for this consult.  SLP Visit Diagnosis Dysphagia, unspecified (R13.10)  Impact on safety and function No limitations  Swallow Evaluation Recommendations  SLP Diet Recommendations Regular solids;Thin liquid  Liquid Administration via Cup;Straw  Medication Administration Whole meds with liquid  Supervision Patient able to self feed  Compensations Slow rate;Small sips/bites  Postural Changes Remain semi-upright after after feeds/meals (Comment);Seated upright at 90 degrees  Individuals Consulted  Consulted and Agree with Results and Recommendations Patient  SLP Time Calculation  SLP Start Time (ACUTE ONLY) 1315  SLP Stop Time (ACUTE ONLY) 1345  SLP Time Calculation (min) (ACUTE ONLY) 30 min  SLP Evaluations  $ SLP Speech Visit 1 Visit  SLP Evaluations  $Outpatient MBS Swallow 1 Procedure

## 2021-02-04 NOTE — Telephone Encounter (Signed)
Please fill up the paperwork for short-term disability.  Patient has upcoming appointment on May 17.  We will discuss further on her next appointment.

## 2021-02-16 ENCOUNTER — Other Ambulatory Visit: Payer: Self-pay

## 2021-02-16 ENCOUNTER — Encounter (HOSPITAL_COMMUNITY): Payer: Self-pay | Admitting: Psychiatry

## 2021-02-16 ENCOUNTER — Telehealth (HOSPITAL_COMMUNITY): Payer: Self-pay | Admitting: Psychiatry

## 2021-02-16 ENCOUNTER — Telehealth (INDEPENDENT_AMBULATORY_CARE_PROVIDER_SITE_OTHER): Payer: 59 | Admitting: Psychiatry

## 2021-02-16 DIAGNOSIS — F319 Bipolar disorder, unspecified: Secondary | ICD-10-CM | POA: Diagnosis not present

## 2021-02-16 DIAGNOSIS — F419 Anxiety disorder, unspecified: Secondary | ICD-10-CM

## 2021-02-16 MED ORDER — VENLAFAXINE HCL ER 37.5 MG PO CP24: 112.5000 mg | ORAL_CAPSULE | Freq: Every day | ORAL | 0 refills | Status: DC

## 2021-02-16 MED ORDER — HYDROXYZINE HCL 10 MG PO TABS: 50.0000 mg | ORAL_TABLET | Freq: Every evening | ORAL | 0 refills | Status: DC | PRN

## 2021-02-16 NOTE — Telephone Encounter (Signed)
D:  Dr. Adele Schilder referred pt back to [facility].  A:  Placed call to re-orient pt.  Pt states she stopped attending recently d/t not receiving the group links.  "I guess they went to my junk mail."  When asked did she not receive the case manager's calls; pt replied I missed all of your voicemails.  Pt will start tomorrow at 9 a.m..  Inform the treatment team and Dr. Adele Schilder.  R:  Pt receptive.
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
## 2021-02-17 ENCOUNTER — Other Ambulatory Visit (HOSPITAL_COMMUNITY): Payer: 59

## 2021-02-17 ENCOUNTER — Other Ambulatory Visit: Payer: Self-pay

## 2021-02-18 ENCOUNTER — Other Ambulatory Visit: Payer: Self-pay

## 2021-02-18 ENCOUNTER — Other Ambulatory Visit (HOSPITAL_COMMUNITY): Payer: 59 | Attending: Psychiatry | Admitting: Psychiatry

## 2021-02-18 DIAGNOSIS — Z62810 Personal history of physical and sexual abuse in childhood: Secondary | ICD-10-CM | POA: Insufficient documentation

## 2021-02-18 DIAGNOSIS — R45851 Suicidal ideations: Secondary | ICD-10-CM | POA: Insufficient documentation

## 2021-02-18 DIAGNOSIS — F411 Generalized anxiety disorder: Secondary | ICD-10-CM | POA: Diagnosis not present

## 2021-02-18 DIAGNOSIS — Z79899 Other long term (current) drug therapy: Secondary | ICD-10-CM | POA: Insufficient documentation

## 2021-02-18 DIAGNOSIS — F319 Bipolar disorder, unspecified: Secondary | ICD-10-CM | POA: Insufficient documentation

## 2021-02-19 ENCOUNTER — Telehealth (HOSPITAL_COMMUNITY): Payer: Self-pay | Admitting: Psychiatry

## 2021-02-19 ENCOUNTER — Other Ambulatory Visit (HOSPITAL_COMMUNITY): Payer: 59

## 2021-02-19 ENCOUNTER — Other Ambulatory Visit: Payer: Self-pay

## 2021-02-19 ENCOUNTER — Encounter (HOSPITAL_COMMUNITY): Payer: Self-pay | Admitting: Psychiatry

## 2021-02-19 NOTE — Progress Notes (Signed)
Virtual Visit via Video Note  I connected with Shannon Kemp on5/19/22 at  9:00 AM EDT by a video enabled telemedicine application and verified that I am speaking with the correct person using two identifiers.   At orientation to the IOP program, Case Manager discussed the limitations of evaluation and management by telemedicine and the availability of in person appointments. The patient expressed understanding and agreed to proceed with virtual visits throughout the duration of the program.   Location:  Patient: Patient Home Provider: Home Office   History of Present Illness: Bipolar 1 DO  Observations/Objective: Check In: Case Manager checked in with all participants to review discharge dates, insurance authorizations, work-related documents and needs from the treatment team regarding medications. Client stated needs and engaged in discussion.   Initial Therapeutic Activity: Counselor facilitated a check-in with group members to assess mood and current functioning. Client shared details of their mental health management since our last session, including challenges and successes. Counselor engaged group in discussion, covering the following topics: life stressors, securing employment and housing strategies, navigating relationships, COVID survival strategies and chronic pain impacting mental health. Client presents with moderate depression and moderate anxiety. Client denied any current SI/HI/psychosis.   Second Therapeutic Activity: Counselor introduced [place], [place] Chaplain to present information and discussion on Grief and Loss. Group members engaged in discussion, sharing how grief impacts them, what comforts them, what emotions are felt, labeling losses, etc. After guest speaker logged off, Counselor prompted group to spend 10-15 minutes journaling to process personal grief and loss situations. Counselor processed entries with group and client's identified areas for  additional processing in individual therapy. Client noted grief related to new diagnosis and childhood truamas.   Check Out: Counselor closed program by allowing time to celebrate a graduating group member. Counselor shared reflections on progress and allow space for group members to share well wishes and appreciates to the graduating client. Counselor prompted graduating client to share takeaways, reflect on progress and final thoughts for the group. Counselor prompted group members to share what self-care practice or productivity activity they will engage in today. Group members shared their plans with the group. Client endorsed safety plan to be followed to prevent safety issues.   Assessment and Plan: Clinician recommends that Client remain in IOP treatment to better manage mental health symptoms, stabilization and to address treatment plan goals. Clinician recommends adherence to crisis/safety plan, taking medications as prescribed, and following up with medical professionals if any issues arise.    Follow Up Instructions: Clinician will send Webex link for next session. The Client was advised to call back or seek an in-person evaluation if the symptoms worsen or if the condition fails to improve as anticipated.     I provided 180 minutes of non-face-to-face time during this encounter.     Lise Auer, LCSW

## 2021-02-21 NOTE — Progress Notes (Signed)
Virtual Visit via Video Note  I connected with Taytum Scheck Babic on 01/18/21 at  9:00 AM EDT by a video enabled telemedicine application and verified that I am speaking with the correct person using two identifiers.  Location: Patient: patient home Provider: clinical home office   At orientation to the IOP program, Case Managerdiscussed the limitations of evaluation and management by telemedicine and the availability of in person appointments. The patient expressed understanding and agreed to proceed with virtual visits throughout the duration of the program.  History of Present Illness: Bipolar 1 Disorder   Observations/Objective: 9:00 - 10:00: Clinician led check-in regarding current stressors and situation.Clinician utilized active listening and empathetic response and validated patient emotions. Clinician facilitated processing group on pertinent issues.  Patient arrived within time allowed and reports that she is feeling "blah." [facility] 1-10 with 10 being great. Pt reports she was around family over the weekend and it was stressful. Pt reports struggling with anxiety around people she is not close with. Pt denies SI/HI. Pt able to process.Pt engaged in discussion.   10:00 - 11:00: Cln facilitated processing group around difficult relationships. Group members shared struggles they face in relationships. Cln brought in topics of self-esteem, CBT thought challenging, boundaries, and communication to aid growth. Pt engaged in discussion and is able to process.   11:00 - 12:00: Cln introduced topic of healthy relationships. Cln discussed the way in which self-esteem can affect what we accept in relationships. Cln emphasized focusing on actions rather than words with those we are in relationship with. Group members shared ways in which their self-esteem has affected relationship dynamics.  Pt engaged in discussion and is able to process and gain insight.    At check-out, cln prompted group members to utilize skills and engage in self-care during their afternoon. Pt denies SI/HI and will follow safety plan should safety issues arise.   Assessment and Plan: Clinician recommends that Client remain in IOP treatment to better manage mental health symptoms, stabilization and to address treatment plan goals. Clinician recommends adherence to crisis/safety plan, taking medications as prescribed, and following up with medical professionals if any issues arise.  Follow Up Instructions: Clinician will send Webex link for next session. The Client was advised to call back or seek an in-person evaluation if the symptoms worsen or if the condition fails to improve as anticipated.  I provided 180 minutes of non-face-to-face time during this encounter.   Lorin Glass, LCSW

## 2021-02-22 ENCOUNTER — Encounter (HOSPITAL_COMMUNITY): Payer: Self-pay

## 2021-02-22 ENCOUNTER — Other Ambulatory Visit (HOSPITAL_COMMUNITY): Payer: 59 | Admitting: Psychiatry

## 2021-02-22 ENCOUNTER — Other Ambulatory Visit: Payer: Self-pay

## 2021-02-22 DIAGNOSIS — F319 Bipolar disorder, unspecified: Secondary | ICD-10-CM | POA: Diagnosis not present

## 2021-02-22 NOTE — Progress Notes (Signed)
Virtual Visit via Video Note  I connected with Shannon Kemp Near on 02/22/21 at  9:00 AM EDT by a video enabled telemedicine application and verified that I am speaking with the correct person using two identifiers.  At orientation to the IOP program, Case Manager discussed the limitations of evaluation and management by telemedicine and the availability of in person appointments. The patient expressed understanding and agreed to proceed with virtual visits throughout the duration of the program.   Location:  Patient: Patient Home Provider: Home Office   History of Present Illness: Bipolar DO  Observations/Objective: Check In: Case Manager checked in with all participants to review discharge dates, insurance authorizations, work-related documents and needs from the treatment team regarding medications. Client stated needs and engaged in discussion.   Initial Therapeutic Activity: Counselor facilitated a check-in with group members to assess mood and current functioning. Client shared details of their mental health management since our last session, including challenges and successes. Counselor engaged group in discussion, covering the following topics: mental health in relation to religion/spirituality, support systems, mental health and family planning, coping strategies, and follow up care. Client presents with moderate depression and moderate anxiety. Client denied any current SI/HI/psychosis.   Second Therapeutic Activity: Counselor engaged group in discussion on current self-compassion practices. Counselor provided psychoeducation on self-compassion. Counselor provided group with a self-assessment, then discussed scores and recommendations as a group. Client identified with challenges regarding practicing self-compassion. Counselor facilitated a self-compassion practice of guided soothing touch. Client identified which soothing touch was most effective for her.   Check Out: Counselor  prompted group members to share what self-care practice or productivity activity they will engage in today. Group members shared their plans with the group. Client endorsed safety plan to be followed to prevent safety issues.   Assessment and Plan: Clinician recommends that Client remain in IOP treatment to better manage mental health symptoms, stabilization and to address treatment plan goals. Clinician recommends adherence to crisis/safety plan, taking medications as prescribed, and following up with medical professionals if any issues arise.    Follow Up Instructions: Clinician will send Webex link for next session. The Client was advised to call back or seek an in-person evaluation if the symptoms worsen or if the condition fails to improve as anticipated.     I provided 180 minutes of non-face-to-face time during this encounter.     Lise Auer, LCSW

## 2021-02-23 ENCOUNTER — Other Ambulatory Visit: Payer: Self-pay

## 2021-02-23 ENCOUNTER — Encounter (HOSPITAL_COMMUNITY): Payer: Self-pay | Admitting: Psychiatry

## 2021-02-23 ENCOUNTER — Other Ambulatory Visit (HOSPITAL_COMMUNITY): Payer: 59 | Admitting: Psychiatry

## 2021-02-23 DIAGNOSIS — F319 Bipolar disorder, unspecified: Secondary | ICD-10-CM | POA: Diagnosis not present

## 2021-02-23 DIAGNOSIS — F411 Generalized anxiety disorder: Secondary | ICD-10-CM

## 2021-02-23 DIAGNOSIS — F419 Anxiety disorder, unspecified: Secondary | ICD-10-CM
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
## 2021-02-24 ENCOUNTER — Other Ambulatory Visit (HOSPITAL_COMMUNITY): Payer: 59 | Admitting: Psychiatry

## 2021-02-24 ENCOUNTER — Other Ambulatory Visit: Payer: Self-pay

## 2021-02-24 DIAGNOSIS — F319 Bipolar disorder, unspecified: Secondary | ICD-10-CM | POA: Diagnosis not present

## 2021-02-25 ENCOUNTER — Encounter (HOSPITAL_COMMUNITY): Payer: Self-pay

## 2021-02-25 ENCOUNTER — Encounter (HOSPITAL_COMMUNITY): Payer: Self-pay | Admitting: Psychiatry

## 2021-02-25 ENCOUNTER — Other Ambulatory Visit (HOSPITAL_COMMUNITY): Payer: 59 | Admitting: Psychiatry

## 2021-02-25 ENCOUNTER — Telehealth (HOSPITAL_COMMUNITY): Payer: Self-pay | Admitting: Psychiatry

## 2021-02-25 ENCOUNTER — Other Ambulatory Visit: Payer: Self-pay

## 2021-02-25 NOTE — Progress Notes (Signed)
Virtual Visit via Video Note  I connected with Shannon Kemp on 02/23/21 at  9:00 AM EDT by a video enabled telemedicine application and verified that I am speaking with the correct person using two identifiers.  At orientation to the IOP program, Case Manager discussed the limitations of evaluation and management by telemedicine and the availability of in person appointments. The patient expressed understanding and agreed to proceed with virtual visits throughout the duration of the program.   Location:  Patient: Patient Home Provider: Home Office   History of Present Illness: Bipolar 1 DO  Observations/Objective: Check In: Case Manager checked in with all participants to review discharge dates, insurance authorizations, work-related documents and needs from the treatment team regarding medications. Client stated needs and engaged in discussion. Case Manager introduced new Client to the group, with group members welcoming and starting the joining process.   Initial Therapeutic Activity: Counselor facilitated a check-in with group members to assess mood and current functioning. Client shared details of their mental health management since our last session, including challenges and successes. Counselor engaged group in discussion, covering the following topics: physical symptoms related to [place], safe alternatives to self-harm, passive SI, applying coping skills, utilizing support system. Client presents with moderate depression and moderate anxiety. Client denied any current SI/HI/psychosis.   Second Therapeutic Activity: Counselor engaged group in discussion on boundaries by sharing about the [employer]. Counselor prompted group members to take a brief boundaries assessment. Counselor shared resources on the website to further understanding and application of boundaries in their relationships and environments. Group members shared their scores, where they find boundaries  easy to apply and where they have challenges. We will continue conversation tomorrow.   Check Out: Counselor prompted group members to share what self-care practice or productivity activity they will engage in today. Group members shared their plans with the group. Client endorsed safety plan to be followed to prevent safety issues.   Assessment and Plan: Clinician recommends that Client remain in IOP treatment to better manage mental health symptoms, stabilization and to address treatment plan goals. Clinician recommends adherence to crisis/safety plan, taking medications as prescribed, and following up with medical professionals if any issues arise.    Follow Up Instructions: Clinician will send Webex link for next session. The Client was advised to call back or seek an in-person evaluation if the symptoms worsen or if the condition fails to improve as anticipated.     I provided 180 minutes of non-face-to-face time during this encounter.  Lise Auer, LCSW

## 2021-02-25 NOTE — Progress Notes (Signed)
Virtual Visit via Video Note  I connected with Florinda Taflinger Wherley on 02/24/21 at  9:00 AM EDT by a video enabled telemedicine application and verified that I am speaking with the correct person using two identifiers.  At orientation to the IOP program, Case Manager discussed the limitations of evaluation and management by telemedicine and the availability of in person appointments. The patient expressed understanding and agreed to proceed with virtual visits throughout the duration of the program.   Location:  Patient: Patient Home Provider: Home Office   History of Present Illness: Bipolar 1 DO  Observations/Objective: Check In: Case Manager checked in with all participants to review discharge dates, insurance authorizations, work-related documents and needs from the treatment team regarding medications. Client stated needs and engaged in discussion. Case Manager introduced new Client to the group, with group members welcoming and starting the joining process.   Initial Therapeutic Activity: Counselor facilitated a check-in with group members to assess mood and current functioning. Client shared details of their mental health management since our last session, including challenges and successes. Counselor engaged group in discussion, covering the following topics: relational issues, sex drive/sexual health issues and trauma. Client presents with moderate depression and moderate anxiety. Client denied any current SI/HI/psychosis.   Second Therapeutic Activity: Counselor continued psychoeducation on boundaries with the group sharing a handout on personal boundaries and the boundary types. Counselor prompted group members to share where they are rigid, porous and health in their boundaries. Group engaged in discussion, sharing specific examples. Counselor shared additional information on boundary areas, such as emotional, material, sexual, intellectual, time and physical. Counselor shared resources on  how to use additional worksheets to further improve their boundary application for homework.   Check Out: Counselor closed program by allowing time to celebrate a graduating group member. Counselor shared reflections on progress and allow space for group members to share well wishes and appreciates to the graduating client. Counselor prompted graduating client to share takeaways, reflect on progress and final thoughts for the group. Counselor prompted group members to share what self-care practice or productivity activity they will engage in today. Group members shared their plans with the group. Client endorsed safety plan to be followed to prevent safety issues.   Assessment and Plan: Clinician recommends that Client remain in IOP treatment to better manage mental health symptoms, stabilization and to address treatment plan goals. Clinician recommends adherence to crisis/safety plan, taking medications as prescribed, and following up with medical professionals if any issues arise.    Follow Up Instructions: Clinician will send Webex link for next session. The Client was advised to call back or seek an in-person evaluation if the symptoms worsen or if the condition fails to improve as anticipated.     I provided 180 minutes of non-face-to-face time during this encounter.     Lise Auer, LCSW

## 2021-02-26 ENCOUNTER — Other Ambulatory Visit (HOSPITAL_COMMUNITY): Payer: 59

## 2021-02-26 ENCOUNTER — Other Ambulatory Visit: Payer: Self-pay

## 2021-03-02 ENCOUNTER — Other Ambulatory Visit (HOSPITAL_COMMUNITY): Payer: 59 | Admitting: Licensed Clinical Social Worker

## 2021-03-02 ENCOUNTER — Other Ambulatory Visit: Payer: Self-pay

## 2021-03-02 DIAGNOSIS — F319 Bipolar disorder, unspecified: Secondary | ICD-10-CM | POA: Diagnosis not present

## 2021-03-02 DIAGNOSIS — F411 Generalized anxiety disorder: Secondary | ICD-10-CM

## 2021-03-03 ENCOUNTER — Other Ambulatory Visit: Payer: Self-pay

## 2021-03-03 ENCOUNTER — Other Ambulatory Visit (HOSPITAL_COMMUNITY): Payer: 59 | Attending: Psychiatry

## 2021-03-03 DIAGNOSIS — F411 Generalized anxiety disorder: Secondary | ICD-10-CM | POA: Insufficient documentation

## 2021-03-03 DIAGNOSIS — Z79899 Other long term (current) drug therapy: Secondary | ICD-10-CM | POA: Insufficient documentation

## 2021-03-03 DIAGNOSIS — F319 Bipolar disorder, unspecified: Secondary | ICD-10-CM | POA: Insufficient documentation

## 2021-03-03 DIAGNOSIS — R45851 Suicidal ideations: Secondary | ICD-10-CM | POA: Insufficient documentation

## 2021-03-04 ENCOUNTER — Other Ambulatory Visit (HOSPITAL_COMMUNITY): Payer: 59 | Admitting: Psychiatry

## 2021-03-04 ENCOUNTER — Other Ambulatory Visit: Payer: Self-pay

## 2021-03-04 ENCOUNTER — Telehealth (HOSPITAL_COMMUNITY): Payer: Self-pay | Admitting: Psychiatry

## 2021-03-05 ENCOUNTER — Other Ambulatory Visit (HOSPITAL_COMMUNITY): Payer: 59 | Admitting: Psychiatry

## 2021-03-05 ENCOUNTER — Other Ambulatory Visit: Payer: Self-pay

## 2021-03-05 ENCOUNTER — Encounter (HOSPITAL_COMMUNITY): Payer: Self-pay | Admitting: Psychiatry

## 2021-03-05 DIAGNOSIS — Z79899 Other long term (current) drug therapy: Secondary | ICD-10-CM | POA: Diagnosis not present

## 2021-03-05 DIAGNOSIS — F319 Bipolar disorder, unspecified: Secondary | ICD-10-CM | POA: Diagnosis present

## 2021-03-05 DIAGNOSIS — R45851 Suicidal ideations: Secondary | ICD-10-CM | POA: Diagnosis not present

## 2021-03-05 DIAGNOSIS — F411 Generalized anxiety disorder: Secondary | ICD-10-CM | POA: Diagnosis not present

## 2021-03-05 NOTE — Progress Notes (Signed)
Virtual Visit via Video Note  I connected with Shannon Kemp on 03/05/21 at  9:00 AM EDT by a video enabled telemedicine application and verified that I am speaking with the correct person using two identifiers.  At orientation to the IOP program, Case Manager discussed the limitations of evaluation and management by telemedicine and the availability of in person appointments. The patient expressed understanding and agreed to proceed with virtual visits throughout the duration of the program.   Location:  Patient: Patient Home Provider: Home Office   History of Present Illness: Bipolar 1 DO  Observations/Objective: Check In: Case Manager checked in with all participants to review discharge dates, insurance authorizations, work-related documents and needs from the treatment team regarding medications. Client stated needs and engaged in discussion.   Initial Therapeutic Activity: Counselor facilitated a check-in with group members to assess mood and current functioning. Client shared details of their mental health management since our last session, including challenges and successes. Counselor engaged group in discussion, covering the following topics: legal processes, community safety, managing MH symptoms with wellness practices, dietary considerations, and boundary setting. Client presents with moderate depression and moderate anxiety. Client denied any current SI/HI/psychosis.  Second Therapeutic Activity: Counselor introduced group to a resource that provides scripts for guided imagery. Counselor chose a healing relaxation script to read aloud to the group. The relaxation practice incorporated deep breathing, progressive muscle relaxation, and imagery. Counselor allowed group members to share about their experience with the practice. Client stated that she felt deeply relaxed and enjoyed the opportunity to connect with her body in that way. Client would like to add more practices to her  self-care routine.   Check Out: Counselor prompted group members to share what self-care practice or productivity activity they will engage in over the weekend. Group members shared their plans with the group. Client endorsed safety plan to be followed to prevent safety issues.   Assessment and Plan: Clinician recommends that Client remain in IOP treatment to better manage mental health symptoms, stabilization and to address treatment plan goals. Clinician recommends adherence to crisis/safety plan, taking medications as prescribed, and following up with medical professionals if any issues arise.    Follow Up Instructions: Clinician will send Webex link for next session. The Client was advised to call back or seek an in-person evaluation if the symptoms worsen or if the condition fails to improve as anticipated.     I provided 180 minutes of non-face-to-face time during this encounter.     Lise Auer, LCSW

## 2021-03-08 ENCOUNTER — Other Ambulatory Visit: Payer: Self-pay

## 2021-03-08 ENCOUNTER — Encounter (HOSPITAL_COMMUNITY): Payer: Self-pay

## 2021-03-08 ENCOUNTER — Other Ambulatory Visit (HOSPITAL_COMMUNITY): Payer: 59 | Admitting: Psychiatry

## 2021-03-08 DIAGNOSIS — F319 Bipolar disorder, unspecified: Secondary | ICD-10-CM | POA: Diagnosis not present

## 2021-03-08 DIAGNOSIS — F419 Anxiety disorder, unspecified: Secondary | ICD-10-CM

## 2021-03-08 NOTE — Progress Notes (Signed)
Virtual Visit via Video Note  I connected with Shannon Kemp on 03/08/21 at  9:00 AM EDT by a video enabled telemedicine application and verified that I am speaking with the correct person using two identifiers.  At orientation to the IOP program, Case Manager discussed the limitations of evaluation and management by telemedicine and the availability of in person appointments. The patient expressed understanding and agreed to proceed with virtual visits throughout the duration of the program.   Location:  Patient: Patient Home Provider: Home Office   History of Present Illness: Bipolar 1 DO and Anxiety  Observations/Objective: Check In: Case Manager checked in with all participants to review discharge dates, insurance authorizations, work-related documents and needs from the treatment team regarding medications. Client stated needs and engaged in discussion.   Initial Therapeutic Activity: Counselor facilitated a check-in with group members to assess mood and current functioning. Client shared details of their mental health management since our last session, including challenges and successes. Counselor engaged group in discussion, covering the following topics: warning signs of self-harm/SI, generational differences, coping with mental health stigma, behavioral train analysis, being empathetic, DBT and setting boundaries. Client presents with moderate depression and moderate anxiety. Client denied any current SI/HI/psychosis.  Second Therapeutic Activity: Group collectively decided that another progressive muscle relaxation and guided imagery would help reduce tension and anxiety within the group today. Counselor played an audio for group to follow prompts. Group members shared that it was relaxing, soothing and helpful in calming their anxiety symptoms. Group members shared about additional local and online resources to access similar exercises.   Check Out: Counselor prompted group  members to share what self-care practice or productivity activity they will engage in today. Client plans to go for a walk outside. Group members shared their plans with the group. Client endorsed safety plan to be followed to prevent safety issues.   Assessment and Plan: Clinician recommends that Client remain in IOP treatment to better manage mental health symptoms, stabilization and to address treatment plan goals. Clinician recommends adherence to crisis/safety plan, taking medications as prescribed, and following up with medical professionals if any issues arise.    Follow Up Instructions: Clinician will send Webex link for next session. The Client was advised to call back or seek an in-person evaluation if the symptoms worsen or if the condition fails to improve as anticipated.     I provided 180 minutes of non-face-to-face time during this encounter.     Shannon Auer, LCSW

## 2021-03-09 ENCOUNTER — Other Ambulatory Visit (HOSPITAL_COMMUNITY): Payer: 59 | Admitting: Psychiatry

## 2021-03-09 ENCOUNTER — Encounter (HOSPITAL_COMMUNITY): Payer: Self-pay

## 2021-03-09 ENCOUNTER — Other Ambulatory Visit: Payer: Self-pay

## 2021-03-09 DIAGNOSIS — F419 Anxiety disorder, unspecified: Secondary | ICD-10-CM

## 2021-03-09 DIAGNOSIS — F319 Bipolar disorder, unspecified: Secondary | ICD-10-CM

## 2021-03-09 NOTE — Progress Notes (Signed)
Virtual Visit via Video Note  I connected with Shannon Kemp on 03/09/21 at  9:00 AM EDT by a video enabled telemedicine application and verified that I am speaking with the correct person using two identifiers.  At orientation to the IOP program, Case Manager discussed the limitations of evaluation and management by telemedicine and the availability of in person appointments. The patient expressed understanding and agreed to proceed with virtual visits throughout the duration of the program.   Location:  Patient: Patient Home Provider: Home Office   History of Present Illness: Bipolar 1 DO  Observations/Objective: Check In: Case Manager checked in with all participants to review discharge dates, insurance authorizations, work-related documents and needs from the treatment team regarding medications. Client stated needs and engaged in discussion.   Initial Therapeutic Activity: Counselor facilitated a check-in with group members to assess mood and current functioning. Client shared details of their mental health management since our last session, including challenges and successes. Counselor engaged group in discussion, covering the following topics: warning signs of self-harm/SI, generational differences, coping with mental health stigma, behavioral train analysis, being empathetic, DBT and setting boundaries. Client presents with moderate depression and moderate anxiety. Client denied any current SI/HI/psychosis.  Second Therapeutic Activity: Counselor presented the cycle of anxiety and cycle of depression to the group, highlighting that avoidance exacerbates the issues/symptoms. Counselor prompted group to create 2 lists, one how anxiety presents for them, and likewise for depression. Counselor then had group members review lists to note similarities and differences to promote awareness and understanding of warning signs. Group members then shared their observations and reflections with the  group. Client engaged well and participated appropriately in activity with thoughtful responses. Counselor finally challenged group to identify action steps in alleviating symptoms and combat avoidance.   Check Out: Counselor prompted group members to share what self-care practice or productivity activity they will engage in today. Group members shared their plans with the group. Counselor closed program by allowing time to celebrate a graduating group member. Counselor shared reflections on progress and allow space for group members to share well wishes and appreciates to the graduating client. Counselor prompted graduating client to share takeaways, reflect on progress and final thoughts for the group. Client endorsed safety plan to be followed to prevent safety issues.   Assessment and Plan: Clinician recommends that Client remain in IOP treatment to better manage mental health symptoms, stabilization and to address treatment plan goals. Clinician recommends adherence to crisis/safety plan, taking medications as prescribed, and following up with medical professionals if any issues arise.    Follow Up Instructions: Clinician will send Webex link for next session. The Client was advised to call back or seek an in-person evaluation if the symptoms worsen or if the condition fails to improve as anticipated.     I provided 180 minutes of non-face-to-face time during this encounter.     Lise Auer, LCSW

## 2021-03-10 ENCOUNTER — Other Ambulatory Visit: Payer: Self-pay

## 2021-03-10 ENCOUNTER — Other Ambulatory Visit (HOSPITAL_COMMUNITY): Payer: 59

## 2021-03-10 MED ORDER — VENLAFAXINE HCL ER 37.5 MG PO CP24: 112.5000 mg | ORAL_CAPSULE | Freq: Every day | ORAL | 0 refills | Status: DC

## 2021-03-10 NOTE — Progress Notes (Signed)
Effexor XR 112.mg daily was refilled.

## 2021-03-10 NOTE — Addendum Note (Signed)
Addended by: Derrill Center on: 03/10/2021 02:25 PM   Modules accepted: Orders

## 2021-03-11 ENCOUNTER — Other Ambulatory Visit (HOSPITAL_COMMUNITY): Payer: 59 | Admitting: Psychiatry

## 2021-03-11 DIAGNOSIS — F319 Bipolar disorder, unspecified: Secondary | ICD-10-CM

## 2021-03-11 NOTE — Patient Instructions (Addendum)
D:  Patient will complete MH-IOP tomorrow (03-12-21).  A:  Discharge on 03-12-21.  Follow up with Dr. Adele Schilder on 04-08-21 @ 9:40 a.m.  Also, follow up with [facility].  Encourage support groups.  R:  Patient receptive.

## 2021-03-12 ENCOUNTER — Encounter (HOSPITAL_COMMUNITY): Payer: Self-pay | Admitting: Psychiatry

## 2021-03-12 ENCOUNTER — Other Ambulatory Visit (HOSPITAL_COMMUNITY): Payer: 59 | Admitting: Psychiatry

## 2021-03-12 ENCOUNTER — Other Ambulatory Visit: Payer: Self-pay

## 2021-03-12 DIAGNOSIS — F319 Bipolar disorder, unspecified: Secondary | ICD-10-CM

## 2021-03-12 NOTE — Progress Notes (Signed)
Virtual Visit via Video Note  I connected with Shannon Kemp on 03/11/21 at  9:00 AM EDT by a video enabled telemedicine application and verified that I am speaking with the correct person using two identifiers.  At orientation to the IOP program, Case Manager discussed the limitations of evaluation and management by telemedicine and the availability of in person appointments. The patient expressed understanding and agreed to proceed with virtual visits throughout the duration of the program.   Location:  Patient: Patient Home Provider: Home Office   History of Present Illness: Bipolar 1 DO  Observations/Objective: Check In: Case Manager checked in with all participants to review discharge dates, insurance authorizations, work-related documents and needs from the treatment team regarding medications. Client stated needs and engaged in discussion.   Initial Therapeutic Activity: Counselor facilitated a check-in with group members to assess mood and current functioning. Client shared details of their mental health management since our last session, including challenges and successes. Counselor engaged group in discussion, covering the following topics: self-care practices, initiating new relationships, gradual exposure, support groups, and therapy animals. Client presents with moderate depression and moderate anxiety. Client denied any current SI/HI/psychosis.  Second Therapeutic Activity: Counselor introduced [place], [place] Chaplain to present information and discussion on Grief and Loss. Group members engaged in discussion, sharing how grief impacts them, what comforts them, what emotions are felt, labeling losses, etc. After guest speaker logged off, Counselor prompted group to spend 10-15 minutes journaling to process personal grief and loss situations. Counselor processed entries with group and client's identified areas for additional processing in individual therapy. Client noted  health conditions and stage of life.   Check Out: Counselor prompted group members to share what self-care practice or productivity activity they will engage in today. Group members shared their plans with the group. Client endorsed safety plan to be followed to prevent safety issues.   Assessment and Plan: Clinician recommends that Client remain in IOP treatment to better manage mental health symptoms, stabilization and to address treatment plan goals. Clinician recommends adherence to crisis/safety plan, taking medications as prescribed, and following up with medical professionals if any issues arise.    Follow Up Instructions: Clinician will send Webex link for next session. The Client was advised to call back or seek an in-person evaluation if the symptoms worsen or if the condition fails to improve as anticipated.     I provided 180 minutes of non-face-to-face time during this encounter.     Lise Auer, LCSW

## 2021-03-12 NOTE — Progress Notes (Signed)
Virtual Visit via Video Note  I connected with Shannon Kemp on @TODAY @ at  9:00 AM EDT by a video enabled telemedicine application and verified that I am speaking with the correct person using two identifiers.  Location: Patient: at home Provider: at home office   I discussed the limitations of evaluation and management by telemedicine and the availability of in person appointments. The patient expressed understanding and agreed to proceed.  I discussed the assessment and treatment plan with the patient. The patient was provided an opportunity to ask questions and all were answered. The patient agreed with the plan and demonstrated an understanding of the instructions.   The patient was advised to call back or seek an in-person evaluation if the symptoms worsen or if the condition fails to improve as anticipated.  I provided 20 minutes of non-face-to-face time during this encounter.   Shannon Kemp, RITA, M.Ed,CNA  As per previous CCA states:  This is a 45 yr old, employed, married, Caucasian female who was referred per Dr. Adele Schilder treatment for worsening depressive/anxiety symptoms.  Stressor/Trigger:  1) Medical:  Dx with a kidney stone, hiatal hernia, narrowing of esophagus and she ruptured her ovarian cyst.  Reports increased stomach pain, neck pain and difficulty eating and swallowing.  Pt has been out of work a lot d/t medical issues.  Pt admits to one prior admit at [facility] (inpt unit) d/t cutting wrists in 2018.  Reports her husband and her two kids are her support system.   Pt attended 11 virtual MH-IOP days out of 16.  Reports overall feeling better, but still having "moments."  States she continues to struggle with wanting to sleep all day.  "Talking in the groups has really helped me."  Pt had a tendency of being very talkative in the groups; required a lot of redirecting.  Pt is looking forward to going on vacation for two weeks in [place], next week.  On a scale of 1-10 (10 being the  worst); pt scored her anxiety at a 4 and depression at a 2. A:  D/C today.  F/U with Dr. Adele Schilder on 04-08-21 @ 9:40 a.m.Marland Kitchen  Pt to f/u with [facility].  Strongly encouraged support groups.  RTW with date the disability company is stating.  R:  Patient receptive.   Dellia Kemp, M.Ed,CNA

## 2021-03-12 NOTE — Progress Notes (Signed)
Virtual Visit via Video Note  I connected with Shannon Kemp on 03/12/21 at  9:00 AM EDT by a video enabled telemedicine application and verified that I am speaking with the correct person using two identifiers.  At orientation to the IOP program, Case Manager discussed the limitations of evaluation and management by telemedicine and the availability of in person appointments. The patient expressed understanding and agreed to proceed with virtual visits throughout the duration of the program.   Location:  Patient: Patient Home Provider: Home Office   History of Present Illness: Bipolar 1 DO  Observations/Objective: Check In: Case Manager checked in with all participants to review discharge dates, insurance authorizations, work-related documents and needs from the treatment team regarding medications. Client stated needs and engaged in discussion.   Initial Therapeutic Activity: Counselor facilitated a check-in with group members to assess mood and current functioning. Client shared details of their mental health management since our last session, including challenges and successes. Counselor engaged group in discussion, covering the following topics: coping strategies for work related stress, outdoor activities to combat depression, grounding exercises, assertive communication of needs, changing up application of copings strategies and discharge planning. Client presents with moderate depression and moderate anxiety. Client denied any current SI/HI/psychosis.  Second Therapeutic Activity: Counselor introduced [employer], representative with [employer] to share about programming. Group Members asked questions and engaged in discussion, as Cristie Hem shared about Peer Support, Support Groups and the [employer]. Client stated that they are interested in connecting with the [facility] and peer support.  Check Out: Counselor closed program by allowing time to celebrate 2 graduating  group members. Counselor shared reflections on progress and allow space for group members to share well wishes and encouragements for the graduating client. Counselor prompted graduating client to share takeaways, reflect on progress and final thoughts for the group.Counselor prompted group members to share what self-care practice or productivity activity they will engage in today. Group members shared their plans with the group. Client endorsed safety plan to be followed to prevent safety issues.   Assessment and Plan: Clinician recommends that Client remain in IOP treatment to better manage mental health symptoms, stabilization and to address treatment plan goals. Clinician recommends adherence to crisis/safety plan, taking medications as prescribed, and following up with medical professionals if any issues arise.    Follow Up Instructions: Clinician will send Webex link for next session. The Client was advised to call back or seek an in-person evaluation if the symptoms worsen or if the condition fails to improve as anticipated.     I provided 180 minutes of non-face-to-face time during this encounter.     Lise Auer, LCSW

## 2021-03-13 NOTE — Progress Notes (Signed)
Virtual Visit via Video Note  I connected with Shannon Kemp on 03/02/21 at  9:00 AM EDT by a video enabled telemedicine application and verified that I am speaking with the correct person using two identifiers.  At orientation to the IOP program, Case Manager discussed the limitations of evaluation and management by telemedicine and the availability of in person appointments. The patient expressed understanding and agreed to proceed with virtual visits throughout the duration of the program.  Location: Patient: patient home Provider: clinical home office   History of Present Illness: Bipolar 1 disorder and anxiety   Observations/Objective: 9:00 - 10:00: Clinician led check-in regarding current stressors and situation. Clinician utilized active listening and empathetic response and validated patient emotions. Clinician facilitated processing group on pertinent issues. Patient arrived within time allowed and reports that she is feeling "rough" Patient rates her mood at a 7 on a scale of 1-10 with 10 being great. Pt able to process. Pt engaged in discussion.     10:00 - 11:00: Cln led discussion on setting boundaries as a way to increase self-care. Group members discussed things that are stumbling blocks to them engaging in self-care and worked to determine what boundary could address that stumbling block. Group worked together to determine how to address the boundary. Cln brought in topics of boundaries, assertiveness, thought challenging, and self-care.      11:00 - 12:00: Cln introduced grounding techniques as a coping strategy. Cln utilized handout "Detaching from emotional pain" from [employer]. Group reviewed grounding strategies and how they can apply them to their every day life and in which situations.      Check Out: Counselor prompted group members to share what self-care practice or productivity activity they will engage today. Client endorsed safety plan to be followed  to prevent safety issues.  Assessment and Plan: Clinician recommends that Client remain in IOP treatment to better manage mental health symptoms, stabilization and to address treatment plan goals. Clinician recommends adherence to crisis/safety plan, taking medications as prescribed, and following up with medical professionals if any issues arise.  Follow Up Instructions: Clinician will send Webex link for next session. The Client was advised to call back or seek an in-person evaluation if the symptoms worsen or if the condition fails to improve as anticipated.   I provided 180  minutes of non-face-to-face time during this encounter.   Lorin Glass, LCSW

## 2021-03-16 NOTE — Progress Notes (Signed)
Virtual Visit via Telephone Note  I connected with Shannon Kemp on 03/16/21 at  9:00 AM EDT by telephone and verified that I am speaking with the correct person using two identifiers.  Location: Patient: Home Provider: Office   I discussed the limitations, risks, security and privacy concerns of performing an evaluation and management service by telephone and the availability of in person appointments. I also discussed with the patient that there may be a patient responsible charge related to this service. The patient expressed understanding and agreed to proceed.     I discussed the assessment and treatment plan with the patient. The patient was provided an opportunity to ask questions and all were answered. The patient agreed with the plan and demonstrated an understanding of the instructions.   The patient was advised to call back or seek an in-person evaluation if the symptoms worsen or if the condition fails to improve as anticipated.  I provided 03/16/2021 minutes of non-face-to-face time during this encounter.   Shannon Center, NP   [facility] Intensive Out[facility] Program Discharge Summary  Shannon Kemp [id number]  Admission date: 02/23/2021 Discharge date: 03/12/2021  Reason for admission: Shannon Kemp is a 45 year old Caucasian female with a charted history of Bipolar, Depression and  Generalized anxiety disorder.  Reports due to her declining health dealing with IBS, kidney stones and ovarian cyst.  States pain has become unbearable. reported due to multiple medical issues this has caused passive suicidal ideations.  Reports recent medication adjustment from Abilify to Effexor.  States she is currently now prescribed Effexor 112.5 daily.  States taking hydroxyzine 4 times daily due to "intense" anxiety. [name] reports mood irritability, decreased motivation.  Passive suicidal ideations.  Denied plan or intent currently.   Progress in Program  Toward Treatment Goals: Ongoing, patient attended and participated with daily groups session sporadically.  Reported due to declining medical health she had multiple Absences.  She Continued to Deny Suicidal or Homicidal Ideations.  Denies Auditory Visual Hallucinations.  Patient to Keep Follow-Up Appointment with Outpatient Provider Psychiatrist Shannon Kemp.  Medications refilled was provided at discharge.   Progress (rationale): Keep all outpatient follow-up appointments with [facility] Kemp and Dr. Adele Kemp  Take all medications as prescribed. Keep all follow-up appointments as scheduled.  Do not consume alcohol or use illegal drugs while on prescription medications. Report any adverse effects from your medications to your primary care provider promptly.  In the event of recurrent symptoms or worsening symptoms, call 911, a crisis hotline, or go to the nearest emergency department for evaluation.    Shannon Ala, NP 03/16/2021

## 2021-04-02 ENCOUNTER — Encounter (HOSPITAL_COMMUNITY): Payer: Self-pay | Admitting: Psychiatry

## 2021-04-02 ENCOUNTER — Other Ambulatory Visit: Payer: Self-pay

## 2021-04-02 ENCOUNTER — Telehealth (INDEPENDENT_AMBULATORY_CARE_PROVIDER_SITE_OTHER): Payer: 59 | Admitting: Psychiatry

## 2021-04-02 DIAGNOSIS — F419 Anxiety disorder, unspecified: Secondary | ICD-10-CM | POA: Diagnosis not present

## 2021-04-02 DIAGNOSIS — F319 Bipolar disorder, unspecified: Secondary | ICD-10-CM

## 2021-04-02 MED ORDER — ARIPIPRAZOLE 5 MG PO TABS: 5.0000 mg | ORAL_TABLET | Freq: Every day | ORAL | 0 refills | Status: DC

## 2021-04-02 MED ORDER — VENLAFAXINE HCL ER 37.5 MG PO CP24: 112.5000 mg | ORAL_CAPSULE | Freq: Every day | ORAL | 0 refills | Status: DC
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
## 2021-04-08 ENCOUNTER — Encounter (HOSPITAL_COMMUNITY): Payer: Self-pay

## 2021-05-25 ENCOUNTER — Telehealth (HOSPITAL_COMMUNITY): Payer: 59 | Admitting: Psychiatry

## 2021-07-05 ENCOUNTER — Telehealth (HOSPITAL_BASED_OUTPATIENT_CLINIC_OR_DEPARTMENT_OTHER): Payer: 59 | Admitting: Psychiatry

## 2021-07-05 ENCOUNTER — Other Ambulatory Visit: Payer: Self-pay

## 2021-07-05 ENCOUNTER — Encounter (HOSPITAL_COMMUNITY): Payer: Self-pay | Admitting: Psychiatry

## 2021-07-05 DIAGNOSIS — F319 Bipolar disorder, unspecified: Secondary | ICD-10-CM | POA: Diagnosis not present

## 2021-07-05 DIAGNOSIS — F419 Anxiety disorder, unspecified: Secondary | ICD-10-CM | POA: Diagnosis not present

## 2021-07-05 MED ORDER — VENLAFAXINE HCL ER 37.5 MG PO CP24: 112.5000 mg | ORAL_CAPSULE | Freq: Every day | ORAL | 0 refills | Status: DC

## 2021-07-05 MED ORDER — ARIPIPRAZOLE 5 MG PO TABS: 5.0000 mg | ORAL_TABLET | Freq: Every day | ORAL | 0 refills | Status: DC

## 2021-07-05 MED ORDER — HYDROXYZINE HCL 10 MG PO TABS: 10.0000 mg | ORAL_TABLET | Freq: Every evening | ORAL | 0 refills | Status: DC | PRN
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
## 2021-07-13 ENCOUNTER — Telehealth: Payer: Self-pay | Admitting: Nurse Practitioner

## 2021-07-13 NOTE — Telephone Encounter (Signed)
LVM for pt to call back to schedule appt for a follow up with Tye Savoy. Thank you.

## 2021-07-14 NOTE — Telephone Encounter (Signed)
Scheduled 08-18-21 3:30pm w/Dr Mansouraty.

## 2021-08-18 ENCOUNTER — Ambulatory Visit: Payer: Self-pay | Admitting: Gastroenterology

## 2021-09-30 ENCOUNTER — Telehealth: Payer: Self-pay | Admitting: Physician Assistant

## 2021-09-30 DIAGNOSIS — J111 Influenza due to unidentified influenza virus with other respiratory manifestations: Secondary | ICD-10-CM

## 2021-09-30 MED ORDER — BENZONATATE 100 MG PO CAPS: 100.0000 mg | ORAL_CAPSULE | Freq: Three times a day (TID) | ORAL | 0 refills | Status: DC | PRN

## 2021-09-30 MED ORDER — FLUTICASONE PROPIONATE 50 MCG/ACT NA SUSP: 2.0000 | Freq: Every day | NASAL | 0 refills | Status: AC

## 2021-09-30 NOTE — Progress Notes (Signed)
E visit for Flu like symptoms   We are sorry that you are not feeling well.  Here is how we plan to help! Based on what you have shared with me it looks like you may have flu-like symptoms that should be watched but do not seem to indicate anti-viral treatment.  I will prescribe some medications to help you feel better including cough medication and nasal spray.  Please start taking Mucinex over-the-counter to help with congestion and your ear discomfort.  You may also take Tylenol or Advil for body aches and fevers.  Influenza or the flu is   an infection caused by a respiratory virus. The flu virus is highly contagious and persons who did not receive their yearly flu vaccination may catch the flu from close contact.  We have anti-viral medications to treat the viruses that cause this infection. They are not a cure and only shorten the course of the infection. These prescriptions are most effective when they are given within the first 2 days of flu symptoms. Antiviral medication are indicated if you have a high risk of complications from the flu. You should  also consider an antiviral medication if you are in close contact with someone who is at risk. These medications can help patients avoid complications from the flu  but have side effects that you should know. Possible side effects from Tamiflu or oseltamivir include nausea, vomiting, diarrhea, dizziness, headaches, eye redness, sleep problems or other respiratory symptoms. You should not take Tamiflu if you have an allergy to oseltamivir or any to the ingredients in Tamiflu.  Based upon your symptoms and potential risk factors I recommend that you follow the flu symptoms recommendation that I have listed below.  ANYONE WHO HAS FLU SYMPTOMS SHOULD: Stay home. The flu is highly contagious and going out or to work exposes others! Be sure to drink plenty of fluids. Water is fine as well as fruit juices, sodas and electrolyte beverages. You  may want to stay away from caffeine or alcohol. If you are nauseated, try taking small sips of liquids. How do you know if you are getting enough fluid? Your urine should be a pale yellow or almost colorless. Get rest. Taking a steamy shower or using a humidifier may help nasal congestion and ease sore throat pain. Using a saline nasal spray works much the same way. Cough drops, hard candies and sore throat lozenges may ease your cough. Line up a caregiver. Have someone check on you regularly.   GET HELP RIGHT AWAY IF: You cannot keep down liquids or your medications. You become short of breath Your fell like you are going to pass out or loose consciousness. Your symptoms persist after you have completed your treatment plan MAKE SURE YOU  Understand these instructions. Will watch your condition. Will get help right away if you are not doing well or get worse.  Your e-visit answers were reviewed by a board certified advanced clinical practitioner to complete your personal care plan.  Depending on the condition, your plan could have included both over the counter or prescription medications.  If there is a problem please reply  once you have received a response from your provider.  Your safety is important to [place].  If you have drug allergies check your prescription carefully.    You can use MyChart to ask questions about todays visit, request a non-urgent call back, or ask for a work or school excuse for 24 hours related to this e-Visit. If  it has been greater than 24 hours you will need to follow up with your provider, or enter a new e-Visit to address those concerns.  You will get an e-mail in the next two days asking about your experience.  I hope that your e-visit has been valuable and will speed your recovery. Thank you for using e-visits.   Greater than 5 minutes, yet less than 10 minutes of time have been spent researching, coordinating, and implementing care for this patient  today

## 2021-10-05 ENCOUNTER — Telehealth (HOSPITAL_BASED_OUTPATIENT_CLINIC_OR_DEPARTMENT_OTHER): Payer: 59 | Admitting: Psychiatry

## 2021-10-05 ENCOUNTER — Other Ambulatory Visit: Payer: Self-pay

## 2021-10-05 ENCOUNTER — Encounter (HOSPITAL_COMMUNITY): Payer: Self-pay | Admitting: Psychiatry

## 2021-10-05 DIAGNOSIS — F419 Anxiety disorder, unspecified: Secondary | ICD-10-CM

## 2021-10-05 DIAGNOSIS — F319 Bipolar disorder, unspecified: Secondary | ICD-10-CM

## 2021-10-05 MED ORDER — ARIPIPRAZOLE 5 MG PO TABS: 5.0000 mg | ORAL_TABLET | Freq: Every day | ORAL | 0 refills | Status: DC

## 2021-10-05 MED ORDER — VENLAFAXINE HCL ER 37.5 MG PO CP24: 112.5000 mg | ORAL_CAPSULE | Freq: Every day | ORAL | 0 refills | Status: DC

## 2021-10-05 MED ORDER — HYDROXYZINE HCL 10 MG PO TABS: 10.0000 mg | ORAL_TABLET | Freq: Every evening | ORAL | 0 refills | Status: DC | PRN
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
## 2021-10-15 ENCOUNTER — Telehealth: Payer: Managed Care, Other (non HMO) | Admitting: Nurse Practitioner

## 2021-10-15 DIAGNOSIS — J029 Acute pharyngitis, unspecified: Secondary | ICD-10-CM | POA: Diagnosis not present

## 2021-10-15 MED ORDER — AMOXICILLIN 500 MG PO CAPS: 500.0000 mg | ORAL_CAPSULE | Freq: Two times a day (BID) | ORAL | 0 refills | Status: AC

## 2021-10-15 NOTE — Progress Notes (Signed)
E-Visit for Sore Throat - Strep Symptoms ° °We are sorry that you are not feeling well.  Here is how we plan to help! ° °Based on what you have shared with me it is likely that you have strep pharyngitis.  Strep pharyngitis is inflammation and infection in the back of the throat.  This is an infection cause by bacteria and is treated with antibiotics.  I have prescribed Amoxicillin 500 mg twice a day for 10 days. For throat pain, we recommend over the counter oral pain relief medications such as acetaminophen or aspirin, or anti-inflammatory medications such as ibuprofen or naproxen sodium. Topical treatments such as oral throat lozenges or sprays may be used as needed. Strep infections are not as easily transmitted as other respiratory infections, however we still recommend that you avoid close contact with loved ones, especially the very young and elderly.  Remember to wash your hands thoroughly throughout the day as this is the number one way to prevent the spread of infection and wipe down door knobs and counters with disinfectant. ° ° °Home Care: °Only take medications as instructed by your medical team. °Complete the entire course of an antibiotic. °Do not take these medications with alcohol. °A steam or ultrasonic humidifier can help congestion.  You can place a towel over your head and breathe in the steam from hot water coming from a faucet. °Avoid close contacts especially the very young and the elderly. °Cover your mouth when you cough or sneeze. °Always remember to wash your hands. ° °Get Help Right Away If: °You develop worsening fever or sinus pain. °You develop a severe head ache or visual changes. °Your symptoms persist after you have completed your treatment plan. ° °Make sure you °Understand these instructions. °Will watch your condition. °Will get help right away if you are not doing well or get worse. ° ° °Thank you for choosing an e-visit. ° °Your e-visit answers were reviewed by a board  certified advanced clinical practitioner to complete your personal care plan. Depending upon the condition, your plan could have included both over the counter or prescription medications. ° °Please review your pharmacy choice. Make sure the pharmacy is open so you can pick up prescription now. If there is a problem, you may contact your provider through MyChart messaging and have the prescription routed to another pharmacy.  Your safety is important to us. If you have drug allergies check your prescription carefully.  ° °For the next 24 hours you can use MyChart to ask questions about today's visit, request a non-urgent call back, or ask for a work or school excuse. °You will get an email in the next two days asking about your experience. I hope that your e-visit has been valuable and will speed your recovery. ° °5-10 minutes spent reviewing and documenting in chart. ° °

## 2021-12-08 ENCOUNTER — Emergency Department (HOSPITAL_BASED_OUTPATIENT_CLINIC_OR_DEPARTMENT_OTHER)
Admission: EM | Admit: 2021-12-08 | Discharge: 2021-12-08 | Disposition: A | Discharge status: Home / Self Care | Payer: 59 | Attending: Emergency Medicine | Admitting: Emergency Medicine

## 2021-12-08 ENCOUNTER — Encounter (HOSPITAL_BASED_OUTPATIENT_CLINIC_OR_DEPARTMENT_OTHER): Payer: Self-pay

## 2021-12-08 ENCOUNTER — Other Ambulatory Visit: Payer: Self-pay

## 2021-12-08 DIAGNOSIS — R000 Tachycardia, unspecified: Secondary | ICD-10-CM | POA: Diagnosis not present

## 2021-12-08 DIAGNOSIS — Z91040 Latex allergy status: Secondary | ICD-10-CM | POA: Insufficient documentation

## 2021-12-08 DIAGNOSIS — R519 Headache, unspecified: Secondary | ICD-10-CM | POA: Diagnosis not present

## 2021-12-08 DIAGNOSIS — R55 Syncope and collapse: Secondary | ICD-10-CM | POA: Insufficient documentation

## 2021-12-08 DIAGNOSIS — N390 Urinary tract infection, site not specified: Secondary | ICD-10-CM

## 2021-12-08 LAB — CBC
HCT: 45.1 % (ref 36.0–46.0)
Hemoglobin: 15 g/dL (ref 12.0–15.0)
MCH: 28.7 pg (ref 26.0–34.0)
MCHC: 33.3 g/dL (ref 30.0–36.0)
MCV: 86.2 fL (ref 80.0–100.0)
Platelets: 268 10*3/uL (ref 150–400)
RBC: 5.23 MIL/uL — ABNORMAL HIGH (ref 3.87–5.11)
RDW: 13.8 % (ref 11.5–15.5)
WBC: 7.1 10*3/uL (ref 4.0–10.5)
nRBC: 0 % (ref 0.0–0.2)

## 2021-12-08 LAB — URINALYSIS, ROUTINE W REFLEX MICROSCOPIC
Bilirubin Urine: NEGATIVE
Glucose, UA: NEGATIVE mg/dL
Hgb urine dipstick: NEGATIVE
Ketones, ur: NEGATIVE mg/dL
Nitrite: NEGATIVE
Protein, ur: NEGATIVE mg/dL
Specific Gravity, Urine: 1.02 (ref 1.005–1.030)
pH: 5 (ref 5.0–8.0)

## 2021-12-08 LAB — BASIC METABOLIC PANEL
Anion gap: 8 (ref 5–15)
BUN: 12 mg/dL (ref 6–20)
CO2: 25 mmol/L (ref 22–32)
Calcium: 8.7 mg/dL — ABNORMAL LOW (ref 8.9–10.3)
Chloride: 105 mmol/L (ref 98–111)
Creatinine, Ser: 0.75 mg/dL (ref 0.44–1.00)
GFR, Estimated: >60 mL/min (ref >60)
Glucose, Bld: 98 mg/dL (ref 70–99)
Potassium: 4.1 mmol/L (ref 3.5–5.1)
Sodium: 138 mmol/L (ref 135–145)

## 2021-12-08 LAB — URINALYSIS, MICROSCOPIC (REFLEX)

## 2021-12-08 LAB — CBG MONITORING, ED: Glucose-Capillary: 91 mg/dL (ref 70–99)

## 2021-12-08 LAB — TROPONIN I (HIGH SENSITIVITY): Troponin I (High Sensitivity): <2 ng/L (ref <18)

## 2021-12-08 MED ORDER — KETOROLAC TROMETHAMINE 30 MG/ML IJ SOLN
30.0000 mg | Freq: Once | INTRAMUSCULAR | Status: AC
  Administered 2021-12-08: 30 mg via INTRAVENOUS
  Filled 2021-12-08: qty 1

## 2021-12-08 MED ORDER — CEPHALEXIN 250 MG PO CAPS
500.0000 mg | ORAL_CAPSULE | Freq: Once | ORAL | Status: AC
  Administered 2021-12-08: 500 mg via ORAL
  Filled 2021-12-08: qty 2

## 2021-12-08 MED ORDER — SUMATRIPTAN SUCCINATE 100 MG PO TABS: 100.0000 mg | ORAL_TABLET | ORAL | 0 refills | Status: AC | PRN
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
## 2021-12-08 NOTE — ED Notes (Signed)
ED Provider at bedside. 

## 2021-12-08 NOTE — ED Provider Notes (Signed)
[facility] EMERGENCY DEPARTMENT Provider Note   CSN: [id number] Arrival date & time: 12/08/21  1220     History  Chief Complaint  Patient presents with   Loss of Consciousness    Shannon Kemp is a 46 y.o. female w/ hx of bipolar disorder, migraines, resenting to emergency department with an episode of loss of consciousness.  The patient reports that she has chronic, near daily migraine headaches, was having a headache today while she was driving and pulled over her car believe she lost consciousness.  She cannot prescribe the prodrome other than to state "I just felt like it was going to pass out".  She believes she was unconscious for about 3 minutes.  She denies any urinary incontinence when she woke up, denies any tongue biting or history of seizures.  She reports that she has had episodes of near syncope in the past but does not feel that she has lost consciousness before.  She also reports she has baseline resting tachycardia.  She states that she has been having "staring spells right spaced out" intermittently for the past several weeks.  She does not lose consciousness during these spells but she says these can occur in the middle of conversation, where she can hear but just feels like she is staring out of space.  She states that she suffers from migraines, saw neurologist 3 years ago, which I confirmed per external records.  She had an MRI of the brain which was unremarkable at that time.  She was prescribed sumatriptan as an abortive medication and used it with relief but ran out of this medication and has not been back to see the neurologist.  She also follows with behavioral health and psychiatry for her bipolar disorder, noted to have stress episodes in the past  She denies history of sudden family cardiac death or known cardiac disease.  HPI     Home Medications Prior to Admission medications   Medication Sig Start Date End Date Taking? Authorizing  Provider  SUMAtriptan (IMITREX) 100 MG tablet Take 1 tablet (100 mg total) by mouth every 2 (two) hours as needed for up to 15 days for migraine. May repeat in 2 hours if headache persists or recurs. Do not exceed 2 doses in 24 hours. 12/08/21 12/23/21 Yes Erum Cercone, Kermit Balo, MD  acetaminophen (TYLENOL) 325 MG tablet Take 650 mg by mouth every 6 (six) hours as needed for mild pain, fever or headache.    [provider]  ARIPiprazole (ABILIFY) 5 MG tablet Take 1 tablet (5 mg total) by mouth daily. 10/05/21 10/05/22  Arfeen, Phillips Grout, MD  benzonatate (TESSALON PERLES) 100 MG capsule Take 1 capsule (100 mg total) by mouth 3 (three) times daily as needed for cough (cough). 09/30/21   Muthersbaugh, Dahlia Client, PA-C  dicyclomine (BENTYL) 20 MG tablet Take 1 tablet (20 mg total) by mouth 3 (three) times daily as needed. 11/24/20   Long, Arlyss Repress, MD  fluticasone (FLONASE) 50 MCG/ACT nasal spray Place 2 sprays into both nostrils daily. 09/30/21   Muthersbaugh, Dahlia Client, PA-C  hydrOXYzine (ATARAX) 10 MG tablet Take 1 tablet (10 mg total) by mouth at bedtime as needed for anxiety. 10/05/21   Arfeen, Phillips Grout, MD  Multiple Vitamin (MULTIVITAMIN WITH MINERALS) TABS tablet Take 1 tablet by mouth daily.    [provider]  ondansetron (ZOFRAN ODT) 4 MG disintegrating tablet Take 1 tablet (4 mg total) by mouth every 8 (eight) hours as needed. Patient not taking: Reported  on 07/05/2021 11/24/20   Maia Plan, MD  pantoprazole (PROTONIX) 40 MG tablet Take 1 tablet (40 mg total) by mouth daily. Patient taking differently: Take 40 mg by mouth 2 (two) times daily. 11/24/20 12/24/20  Long, Arlyss Repress, MD  polyvinyl alcohol (LIQUIFILM TEARS) 1.4 % ophthalmic solution Place 1 drop into both eyes as needed for dry eyes.    [provider]  venlafaxine XR (EFFEXOR-XR) 37.5 MG 24 hr capsule Take 3 capsules (112.5 mg total) by mouth daily. For mood control 10/05/21   Arfeen, Phillips Grout, MD  Vitamin D, Ergocalciferol, (DRISDOL)  1.25 MG (50000 UNIT) CAPS capsule Take 50,000 Units by mouth once a week. 10/30/20   [provider]      Allergies    Morphine and related, Adhesive [tape], and Latex    Review of Systems   Review of Systems  Physical Exam Updated Vital Signs BP (!) 136/99   Pulse 99   Temp 98.7 F (37.1 C) (Oral)   Resp (!) 22   Ht 5\' 5"  (1.651 m)   Wt 80.7 kg   LMP 12/01/2020 (Exact Date) Comment: tubaligation  SpO2 99%   BMI 29.62 kg/m  Physical Exam Constitutional:      General: She is not in acute distress. HENT:     Head: Normocephalic and atraumatic.  Eyes:     Conjunctiva/sclera: Conjunctivae normal.     Pupils: Pupils are equal, round, and reactive to light.  Cardiovascular:     Rate and Rhythm: Regular rhythm. Tachycardia present.  Pulmonary:     Effort: Pulmonary effort is normal. No respiratory distress.  Abdominal:     General: There is no distension.     Tenderness: There is no abdominal tenderness.  Skin:    General: Skin is warm and dry.  Neurological:     General: No focal deficit present.     Mental Status: She is alert and oriented to person, place, and time. Mental status is at baseline.     Sensory: No sensory deficit.     Motor: No weakness.  Psychiatric:        Mood and Affect: Mood normal.        Behavior: Behavior normal.    ED Results / Procedures / Treatments   Labs (all labs ordered are listed, but only abnormal results are displayed) Labs Reviewed  BASIC METABOLIC PANEL - Abnormal; Notable for the following components:      Result Value   Calcium 8.7 (*)    All other components within normal limits  CBC - Abnormal; Notable for the following components:   RBC 5.23 (*)    All other components within normal limits  URINALYSIS, ROUTINE W REFLEX MICROSCOPIC - Abnormal; Notable for the following components:   Color, Urine STRAW (*)    APPearance HAZY (*)    Leukocytes,Ua MODERATE (*)    All other components within normal limits   URINALYSIS, MICROSCOPIC (REFLEX) - Abnormal; Notable for the following components:   Bacteria, UA MANY (*)    All other components within normal limits  URINE CULTURE  CBG MONITORING, ED  TROPONIN I (HIGH SENSITIVITY)    EKG EKG Interpretation  Date/Time:  Wednesday December 08 2021 12:37:51 EST Ventricular Rate:  122 PR Interval:  112 QRS Duration: 72 QT Interval:  314 QTC Calculation: 447 R Axis:   41 Text Interpretation: Sinus tachycardia Nonspecific ST abnormality Abnormal ECG When compared with ECG of 28-Nov-2020 11:27, PREVIOUS ECG IS PRESENT Confirmed by  Alvester Chou ([id number]) on 12/08/2021 1:16:49 PM  Radiology No results found.  Procedures Procedures    Medications Ordered in ED Medications  cephALEXin (KEFLEX) capsule 500 mg (500 mg Oral Given 12/08/21 1432)  ketorolac (TORADOL) 30 MG/ML injection 30 mg (30 mg Intravenous Given 12/08/21 1432)    ED Course/ Medical Decision Making/ A&P Clinical Course as of 12/08/21 1756  Wed Dec 08, 2021  1521 Patient's husband is not here to take him home.  Work-up has been unremarkable.  My suspicion is this episode may have been related to a stress response or her underlying psychiatric conditions.  It may also be related to complex migraines.  I can represcribe some triptans that she ran out and refer her back to see her neurologist.  However she has not had an echocardiogram, and I think an evaluation by a cardiologist may be reasonable for syncope.  Is a much lower suspicion for seizure.  Do not see evidence of sepsis, severe dehydration or infection.  Troponin is undetectable.  Doubt PE, or ACS. [MT]  1525 I advised that the patient avoid driving if possible; I have a low suspicion for seizure I did not delineate strict no driving precautions for 6 months.  However advised that she avoid high-speed driving, that if she develop any symptoms of lightheadedness that she pull over immediately, as it appears she does experience some  prodromal symptoms. She verbalized understanding and said that she would do so [MT]    Clinical Course User Index [MT] Kylin Genna, Kermit Balo, MD                           Medical Decision Making Amount and/or Complexity of Data Reviewed Labs: ordered.  Risk Prescription drug management.   This patient presents to the ED with concern for syncope versus near syncope, this involves an extensive number of treatment options, and is a complaint that carries with it a high risk of complications and morbidity.  The differential diagnosis includes arrhythmia versus psychiatric illness or event versus dehydration vs other  External records from outside source obtained and reviewed including 2019 neurology evaluation, most recent MRI of the brain, psychiatry office evaluation  Patient's Apple Watch has not noted any tachycardic events with a heart rate over 150 today.  It seems less likely that she experienced an extreme SVT that was resulted in loss of consciousness.  However I think it is reasonable to refer her to cardiology for further evaluation further resting tachycardia and possible syncope  I ordered and personally interpreted labs.  The pertinent results include:   trop <2.  Unclear if UA is truly UTI - we'll send urine culture, hold off on further antibiotics for now (she does not have active dysuria).   The patient was maintained on a cardiac monitor.  I personally viewed and interpreted the cardiac monitored which showed an underlying rhythm of: Sinus tachycardia  Per my interpretation the patient's ECG shows sinus tachycardia without acute ischemic findings   I have reviewed the patients home medicines and have made adjustments as needed  Test Considered:  - Doubt acute PE  Dispostion:  After consideration of the diagnostic results and the patients response to treatment, I feel that the patent would benefit from outpatient PCP f/u.         Final Clinical Impression(s) / ED  Diagnoses Final diagnoses:  Near syncope    Rx / DC Orders ED Discharge Orders  Ordered    Ambulatory referral to Cardiology       Comments: Syncope vs near syncope episode   12/08/21 1519    SUMAtriptan (IMITREX) 100 MG tablet  Every 2 hours PRN        12/08/21 1521              Terald Sleeper, MD 12/08/21 1758

## 2021-12-10 LAB — URINE CULTURE

## 2021-12-14 ENCOUNTER — Encounter (HOSPITAL_COMMUNITY): Payer: Self-pay

## 2021-12-14 ENCOUNTER — Emergency Department (HOSPITAL_COMMUNITY)
Admission: EM | Admit: 2021-12-14 | Discharge: 2021-12-14 | Disposition: A | Discharge status: Home / Self Care | Payer: 59 | Attending: Emergency Medicine | Admitting: Emergency Medicine

## 2021-12-14 ENCOUNTER — Telehealth: Payer: 59 | Admitting: Physician Assistant

## 2021-12-14 ENCOUNTER — Other Ambulatory Visit: Payer: Self-pay

## 2021-12-14 ENCOUNTER — Emergency Department (HOSPITAL_COMMUNITY): Payer: 59

## 2021-12-14 ENCOUNTER — Inpatient Hospital Stay: Admission: RE | Admit: 2021-12-14 | Payer: 59 | Source: Ambulatory Visit

## 2021-12-14 DIAGNOSIS — R0602 Shortness of breath: Secondary | ICD-10-CM | POA: Diagnosis not present

## 2021-12-14 DIAGNOSIS — R000 Tachycardia, unspecified: Secondary | ICD-10-CM | POA: Diagnosis not present

## 2021-12-14 DIAGNOSIS — R079 Chest pain, unspecified: Secondary | ICD-10-CM | POA: Insufficient documentation

## 2021-12-14 DIAGNOSIS — R55 Syncope and collapse: Secondary | ICD-10-CM | POA: Insufficient documentation

## 2021-12-14 DIAGNOSIS — R002 Palpitations: Secondary | ICD-10-CM | POA: Insufficient documentation

## 2021-12-14 DIAGNOSIS — Z91040 Latex allergy status: Secondary | ICD-10-CM | POA: Diagnosis not present

## 2021-12-14 DIAGNOSIS — R0789 Other chest pain: Secondary | ICD-10-CM

## 2021-12-14 LAB — TROPONIN I (HIGH SENSITIVITY)
Troponin I (High Sensitivity): <2 ng/L (ref <18)
Troponin I (High Sensitivity): 2 ng/L (ref <18)

## 2021-12-14 LAB — BASIC METABOLIC PANEL
Anion gap: 8 (ref 5–15)
BUN: 10 mg/dL (ref 6–20)
CO2: 24 mmol/L (ref 22–32)
Calcium: 8.6 mg/dL — ABNORMAL LOW (ref 8.9–10.3)
Chloride: 104 mmol/L (ref 98–111)
Creatinine, Ser: 0.6 mg/dL (ref 0.44–1.00)
GFR, Estimated: >60 mL/min (ref >60)
Glucose, Bld: 93 mg/dL (ref 70–99)
Potassium: 3.7 mmol/L (ref 3.5–5.1)
Sodium: 136 mmol/L (ref 135–145)

## 2021-12-14 LAB — CBC
HCT: 43.6 % (ref 36.0–46.0)
Hemoglobin: 14.7 g/dL (ref 12.0–15.0)
MCH: 28.5 pg (ref 26.0–34.0)
MCHC: 33.7 g/dL (ref 30.0–36.0)
MCV: 84.7 fL (ref 80.0–100.0)
Platelets: 297 10*3/uL (ref 150–400)
RBC: 5.15 MIL/uL — ABNORMAL HIGH (ref 3.87–5.11)
RDW: 13.4 % (ref 11.5–15.5)
WBC: 7.9 10*3/uL (ref 4.0–10.5)
nRBC: 0 % (ref 0.0–0.2)

## 2021-12-14 LAB — I-STAT BETA HCG BLOOD, ED (MC, WL, AP ONLY): I-stat hCG, quantitative: <5 m[IU]/mL (ref <5)

## 2021-12-14 LAB — D-DIMER, QUANTITATIVE: D-Dimer, Quant: 0.43 ug/mL-FEU (ref 0.00–0.50)

## 2021-12-14 NOTE — Discharge Instructions (Addendum)
Your workup was overall reassuring in the ED today.  ? ?Please follow up with both your PCP and cardiology for further evaluation.  ? ?Return to the ED for any new/worsening symptoms ?

## 2021-12-14 NOTE — ED Provider Notes (Signed)
?[facility] DEPT ?Provider Note ? ? ?CSN: [id number] ?Arrival date & time: 12/14/21  1704 ? ?  ? ?History ? ?Chief Complaint  ?Patient presents with  ? Chest Pain  ? Shortness of Breath  ? ? ?Shannon Kemp is a 46 y.o. female who presents to the ED Today with complaint of gradual onset, constant, left sided chest pain x 5-6 days.  Patient also complains of shortness of breath and near syncope. Per chart review she was seen on 03/08 for multiple complaints including near syncope.  Reports that she has follow-up with cardiology for near syncope/syncope at the end of this month.  She states that her heart rate has been significantly high per her Apple Watch at home causing concern. Denies hx DVT/PE. No recent prolonged travel or immobilization. No hemoptysis. No active malignancy. No exogenous hormone use. ?  ? ?The history is provided by the patient and medical records.  ? ?  ? ?Home Medications ?Prior to Admission medications   ?Medication Sig Start Date End Date Taking? Authorizing Provider  ?acetaminophen (TYLENOL) 325 MG tablet Take 650 mg by mouth every 6 (six) hours as needed for mild pain, fever or headache.    [provider]  ?ARIPiprazole (ABILIFY) 5 MG tablet Take 1 tablet (5 mg total) by mouth daily. 10/05/21 10/05/22  Arfeen, Arlyce Harman, MD  ?benzonatate (TESSALON PERLES) 100 MG capsule Take 1 capsule (100 mg total) by mouth 3 (three) times daily as needed for cough (cough). 09/30/21   Muthersbaugh, Jarrett Soho, PA-C  ?dicyclomine (BENTYL) 20 MG tablet Take 1 tablet (20 mg total) by mouth 3 (three) times daily as needed. 11/24/20   Long, Wonda Olds, MD  ?fluticasone (FLONASE) 50 MCG/ACT nasal spray Place 2 sprays into both nostrils daily. 09/30/21   Muthersbaugh, Jarrett Soho, PA-C  ?hydrOXYzine (ATARAX) 10 MG tablet Take 1 tablet (10 mg total) by mouth at bedtime as needed for anxiety. 10/05/21   Arfeen, Arlyce Harman, MD  ?Multiple Vitamin (MULTIVITAMIN WITH MINERALS) TABS tablet Take 1  tablet by mouth daily.    [provider]  ?ondansetron (ZOFRAN ODT) 4 MG disintegrating tablet Take 1 tablet (4 mg total) by mouth every 8 (eight) hours as needed. ?Patient not taking: Reported on 07/05/2021 11/24/20   Margette Fast, MD  ?pantoprazole (PROTONIX) 40 MG tablet Take 1 tablet (40 mg total) by mouth daily. ?Patient taking differently: Take 40 mg by mouth 2 (two) times daily. 11/24/20 12/24/20  Margette Fast, MD  ?polyvinyl alcohol (LIQUIFILM TEARS) 1.4 % ophthalmic solution Place 1 drop into both eyes as needed for dry eyes.    [provider]  ?SUMAtriptan (IMITREX) 100 MG tablet Take 1 tablet (100 mg total) by mouth every 2 (two) hours as needed for up to 15 days for migraine. May repeat in 2 hours if headache persists or recurs. Do not exceed 2 doses in 24 hours. 12/08/21 12/23/21  Wyvonnia Dusky, MD  ?venlafaxine XR (EFFEXOR-XR) 37.5 MG 24 hr capsule Take 3 capsules (112.5 mg total) by mouth daily. For mood control 10/05/21   Arfeen, Arlyce Harman, MD  ?Vitamin D, Ergocalciferol, (DRISDOL) 1.25 MG (50000 UNIT) CAPS capsule Take 50,000 Units by mouth once a week. 10/30/20   [provider]  ?   ? ?Allergies    ?Morphine and related, Adhesive [tape], and Latex   ? ?Review of Systems   ?Review of Systems  ?Constitutional:  Negative for chills and fever.  ?Respiratory:  Positive for shortness  of breath.   ?Cardiovascular:  Positive for chest pain.  ?Gastrointestinal:  Negative for nausea and vomiting.  ?All other systems reviewed and are negative. ? ?Physical Exam ?Updated Vital Signs ?BP (!) 137/95 (BP Location: Right Arm)   Pulse (!) 103   Temp 98.4 ?F (36.9 ?C) (Oral)   Resp 16   Ht '5\' 5"'$  (1.651 m)   Wt 80.7 kg   LMP 12/01/2020 (Exact Date) Comment: tubaligation  SpO2 98%   BMI 29.62 kg/m?  ?Physical Exam ?Vitals and nursing note reviewed.  ?Constitutional:   ?   Appearance: She is not ill-appearing.  ?HENT:  ?   Head: Normocephalic and atraumatic.  ?Eyes:  ?    Conjunctiva/sclera: Conjunctivae normal.  ?Cardiovascular:  ?   Rate and Rhythm: Regular rhythm. Tachycardia present.  ?   Pulses:     ?     Radial pulses are 2+ on the right side and 2+ on the left side.  ?     Dorsalis pedis pulses are 2+ on the right side and 2+ on the left side.  ?Pulmonary:  ?   Effort: Pulmonary effort is normal.  ?   Breath sounds: Normal breath sounds. No decreased breath sounds, wheezing, rhonchi or rales.  ?Chest:  ?   Chest wall: No tenderness.  ?Abdominal:  ?   Palpations: Abdomen is soft.  ?   Tenderness: There is no abdominal tenderness.  ?Musculoskeletal:  ?   Cervical back: Neck supple.  ?   Right lower leg: No tenderness. No edema.  ?   Left lower leg: No tenderness. No edema.  ?Skin: ?   General: Skin is warm and dry.  ?Neurological:  ?   Mental Status: She is alert.  ? ? ?ED Results / Procedures / Treatments   ?Labs ?(all labs ordered are listed, but only abnormal results are displayed) ?Labs Reviewed  ?BASIC METABOLIC PANEL - Abnormal; Notable for the following components:  ?    Result Value  ? Calcium 8.6 (*)   ? All other components within normal limits  ?CBC - Abnormal; Notable for the following components:  ? RBC 5.15 (*)   ? All other components within normal limits  ?D-DIMER, QUANTITATIVE  ?I-STAT BETA HCG BLOOD, ED ([facility], [facility], [facility] ONLY)  ?TROPONIN I (HIGH SENSITIVITY)  ?TROPONIN I (HIGH SENSITIVITY)  ? ? ?EKG ?None ? ?Radiology ?DG Chest 2 View ? ?Result Date: 12/14/2021 ?CLINICAL DATA:  Chest pain and shortness of breath. EXAM: CHEST - 2 VIEW COMPARISON:  11/28/2020 FINDINGS: Cardiac silhouette and mediastinal contours are within normal limits. The lungs are clear. No pleural effusion or pneumothorax. Mild degenerative disc changes of the midthoracic spine. IMPRESSION: No active cardiopulmonary disease. Electronically Signed   By: Yvonne Kendall M.D.   On: 12/14/2021 18:14   ? ?Procedures ?Procedures  ? ? ?Medications Ordered in ED ?Medications - No data to display ? ?ED  Course/ Medical Decision Making/ A&P ?  ?                        ?Medical Decision Making ?70 female who presents to the ED today with recurrent chest pain for the past 5 days with associated shortness of breath and palpitations.  On arrival to the ED patient is tachycardic 126.  Remainder vitals unremarkable.  Per chart review she was seen for near syncope as well as other multiple complaints a week ago however work-up including troponins were negative.  Does not  appear she had D-dimer at that time.  She ultimately denies risk factors related to PE however unable to [facility] out secondary to tachycardia.  We will plan to work-up for chest pain at this time including EKG, chest x-ray, troponin, D-dimer.  Continue to monitor in the ED. ? ?Chest x-ray clear. ?Initial troponin less than 2 with repeat of 2. ?D-dimer negative at 0.43.  Low Wells risk.  Do not feel she requires CTA at this time. ?CBC and BMP without electrolyte abnormalities or any acute findings. ? ?Work-up overall reassuring.  Did have lengthy discussion with patient regarding need for cardiology follow-up.  She admits to me that she has actually been dealing with palpitations for several years.  She states that she wore a Holter monitor several years ago for about 48 hours however was told that it was normal.  She shows me her Apple Watch heart rate monitor and appears that she fluctuates between 70 to 130 bpm throughout the day sporadically.  Have ultimately recommended continuation of cardiology follow-up.  Will likely require additional monitoring, echo, further eval.  Do not feel she requires additional work-up in the ED at this time.  Heart rate is improved while being in the ED today.  She ultimately feels stable for discharge.  Have recommended strict return precautions.  She is in agreement with plan and stable for discharge at this time. ? ? ?Problems Addressed: ?Nonspecific chest pain: acute illness or injury ? ?Amount and/or Complexity of Data  Reviewed ?Labs: ordered. Decision-making details documented in ED Course. ?Radiology: ordered. Decision-making details documented in ED Course. ? ? ? ? ? ? ? ? ? ?Final Clinical Impression(s) / ED Diagnoses ?Final diagnoses:  ?Nonspeci

## 2021-12-14 NOTE — ED Provider Triage Note (Signed)
Emergency Medicine Provider Triage Evaluation Note ? ?Shannon Kemp , a 46 y.o. female  was evaluated in triage.  Pt complains of gradual onset, constant, left sided chest pain x 5-6 days.  Patient also complains of shortness of breath and near syncope.  Per chart review she was seen on 03/08 for multiple complaints including near syncope.  Suspicion for PE at that time, was not worked up for same.  Reports that she has follow-up with cardiology for near syncope/syncope at the end of this month.  She states that her heart rate has been significantly high per her Apple Watch at home causing concern. Denies hx DVT/PE. No recent prolonged travel or immobilization. No hemoptysis. No active malignancy. No exogenous hormone use. ? ?Review of Systems  ?Positive: + cp, sob, palpitations, near syncope ?Negative: - leg swelling, diaphoresis, nausea, vomiting ? ?Physical Exam  ?BP (!) 146/109   Pulse (!) 126   Temp 98.4 ?F (36.9 ?C) (Oral)   Resp 18   Ht '5\' 5"'$  (1.651 m)   Wt 80.7 kg   LMP 12/01/2020 (Exact Date) Comment: tubaligation  SpO2 96%   BMI 29.62 kg/m?  ?Gen:   Awake, no distress   ?Resp:  Normal effort  ?MSK:   Moves extremities without difficulty  ?Other:  Tachy ? ?Medical Decision Making  ?Medically screening exam initiated at 5:38 PM.  Appropriate orders placed.  Shannon Kemp was informed that the remainder of the evaluation will be completed by another provider, this initial triage assessment does not replace that evaluation, and the importance of remaining in the ED until their evaluation is complete. ? ? ?  ?Eustaquio Maize, PA-C ?12/14/21 1739 ? ?

## 2021-12-14 NOTE — ED Triage Notes (Signed)
Patient states she has been having chest pain and SOB x 1 week. Patient went to [facility] on 12/08/21. Patient states since then her chest tightness has increased. Patient states her HR rate increases to the 120's and when her HR increases she gets SOB. ?

## 2021-12-14 NOTE — ED Notes (Signed)
Discharge instructions reviewed with patient.  All questions answered.  Patient voiced understanding.  ?

## 2021-12-14 NOTE — Progress Notes (Signed)
?Virtual Visit Consent  ? ?Shannon Kemp, you are scheduled for a virtual visit with a [facility] provider today.   ?  ?Just as with appointments in the office, your consent must be obtained to participate.  Your consent will be active for this visit and any virtual visit you may have with one of our providers in the next 365 days.   ?  ?If you have a MyChart account, a copy of this consent can be sent to you electronically.  All virtual visits are billed to your insurance company just like a traditional visit in the office.   ? ?As this is a virtual visit, video technology does not allow for your provider to perform a traditional examination.  This may limit your provider's ability to fully assess your condition.  If your provider identifies any concerns that need to be evaluated in person or the need to arrange testing (such as labs, EKG, etc.), we will make arrangements to do so.   ?  ?Although advances in technology are sophisticated, we cannot ensure that it will always work on either your end or our end.  If the connection with a video visit is poor, the visit may have to be switched to a telephone visit.  With either a video or telephone visit, we are not always able to ensure that we have a secure connection.    ? ?I need to obtain your verbal consent now.   Are you willing to proceed with your visit today?  ?  ?MAISHA BOGEN has provided verbal consent on 12/14/2021 for a virtual visit (video or telephone). ?  ?Leeanne Rio, PA-C  ? ?Date: 12/14/2021 4:21 PM ? ? ?Virtual Visit via Video Note  ? ?ILeeanne Rio, connected with  RACQUELLE HYSER  ([id number], 1976/01/12) on 12/14/21 at  4:15 PM EDT by a video-enabled telemedicine application and verified that I am speaking with the correct person using two identifiers. ? ?Location: ?Patient: Virtual Visit Location Patient: Home ?Provider: Virtual Visit Location Provider: Home Office ?  ?I discussed the limitations of evaluation and  management by telemedicine and the availability of in person appointments. The patient expressed understanding and agreed to proceed.   ? ?History of Present Illness: ?Shannon Kemp is a 46 y.o. who identifies as a female who was assigned female at birth, and is being seen today for some ongoing but worsening episodes of labile heart rate -- 60s up to 120s with left sided chest pressure. Notes was seen last week in ER for episode of this causing syncope. ER workup included troponins (negative) , EKG (sinus tachycardia) and other routine labs that were overall unremarkable. Was set up to see cardiology but cannot see them until the end of the month. Notes episodes come and go, lasting about 1-2 minutes. Can happen at rest or exertion. She denies lightheadedness or dizziness at present. Pain is 5-8/10 when present. Has had several episodes throughout the day.  ? ?HPI: HPI  ?Problems:  ?Patient Active Problem List  ? Diagnosis Date Noted  ? Globus sensation 01/18/2021  ? Dysphagia 01/18/2021  ? Gastroesophageal reflux disease with esophagitis 01/18/2021  ? Hiatal hernia 01/18/2021  ? Lower abdominal pain 01/18/2021  ? Major depression 07/19/2017  ? Bipolar disorder ([place]) 07/18/2017  ?  ?Allergies:  ?Allergies  ?Allergen Reactions  ? Morphine And Related Itching and Other (See Comments)  ?  hallucinations  ? Adhesive [Tape] Rash  ? Latex Rash  ? ?  Medications:  ?Current Outpatient Medications:  ?  acetaminophen (TYLENOL) 325 MG tablet, Take 650 mg by mouth every 6 (six) hours as needed for mild pain, fever or headache., Disp: , Rfl:  ?  ARIPiprazole (ABILIFY) 5 MG tablet, Take 1 tablet (5 mg total) by mouth daily., Disp: 90 tablet, Rfl: 0 ?  benzonatate (TESSALON PERLES) 100 MG capsule, Take 1 capsule (100 mg total) by mouth 3 (three) times daily as needed for cough (cough)., Disp: 20 capsule, Rfl: 0 ?  dicyclomine (BENTYL) 20 MG tablet, Take 1 tablet (20 mg total) by mouth 3 (three) times daily as needed., Disp: 20  tablet, Rfl: 0 ?  fluticasone (FLONASE) 50 MCG/ACT nasal spray, Place 2 sprays into both nostrils daily., Disp: 9.9 g, Rfl: 0 ?  hydrOXYzine (ATARAX) 10 MG tablet, Take 1 tablet (10 mg total) by mouth at bedtime as needed for anxiety., Disp: 30 tablet, Rfl: 0 ?  Multiple Vitamin (MULTIVITAMIN WITH MINERALS) TABS tablet, Take 1 tablet by mouth daily., Disp: , Rfl:  ?  ondansetron (ZOFRAN ODT) 4 MG disintegrating tablet, Take 1 tablet (4 mg total) by mouth every 8 (eight) hours as needed. (Patient not taking: Reported on 07/05/2021), Disp: 20 tablet, Rfl: 0 ?  pantoprazole (PROTONIX) 40 MG tablet, Take 1 tablet (40 mg total) by mouth daily. (Patient taking differently: Take 40 mg by mouth 2 (two) times daily.), Disp: 30 tablet, Rfl: 0 ?  polyvinyl alcohol (LIQUIFILM TEARS) 1.4 % ophthalmic solution, Place 1 drop into both eyes as needed for dry eyes., Disp: , Rfl:  ?  SUMAtriptan (IMITREX) 100 MG tablet, Take 1 tablet (100 mg total) by mouth every 2 (two) hours as needed for up to 15 days for migraine. May repeat in 2 hours if headache persists or recurs. Do not exceed 2 doses in 24 hours., Disp: 15 tablet, Rfl: 0 ?  venlafaxine XR (EFFEXOR-XR) 37.5 MG 24 hr capsule, Take 3 capsules (112.5 mg total) by mouth daily. For mood control, Disp: 270 capsule, Rfl: 0 ?  Vitamin D, Ergocalciferol, (DRISDOL) 1.25 MG (50000 UNIT) CAPS capsule, Take 50,000 Units by mouth once a week., Disp: , Rfl:  ? ?Observations/Objective: ?Patient is well-developed, well-nourished in no acute distress.  ?Resting comfortably at home.  ?Head is normocephalic, atraumatic.  ?No labored breathing. ?Speech is clear and coherent with logical content.  ?Patient is alert and oriented at baseline.  ? ?Assessment and Plan: ?1. Atypical chest pain ? ?Requires in-person evaluation. She has had negative ER workup and awaiting cardiology assessment but needs a hands-on examination, assessment of vitals and repeat EKG. Discussed UC/ER disposition. She is going  to have someone take her to be evaluated.  ? ?No charge for video visit.  ? ?Follow Up Instructions: ?I discussed the assessment and treatment plan with the patient. The patient was provided an opportunity to ask questions and all were answered. The patient agreed with the plan and demonstrated an understanding of the instructions.  A copy of instructions were sent to the patient via MyChart unless otherwise noted below.  ? ? ?The patient was advised to call back or seek an in-person evaluation if the symptoms worsen or if the condition fails to improve as anticipated. ? ?Time:  ?I spent 15 minutes with the patient via telehealth technology discussing the above problems/concerns.   ? ?Leeanne Rio, PA-C ?

## 2021-12-14 NOTE — Patient Instructions (Signed)
?[name], thank you for joining Leeanne Rio, PA-C for today's virtual visit.  While this provider is not your primary care provider (PCP), if your PCP is located in our provider database this encounter information will be shared with them immediately following your visit. ? ?Consent: ?(Patient) Shannon Kemp provided verbal consent for this virtual visit at the beginning of the encounter. ? ?Current Medications: ? ?Current Outpatient Medications:  ?  acetaminophen (TYLENOL) 325 MG tablet, Take 650 mg by mouth every 6 (six) hours as needed for mild pain, fever or headache., Disp: , Rfl:  ?  ARIPiprazole (ABILIFY) 5 MG tablet, Take 1 tablet (5 mg total) by mouth daily., Disp: 90 tablet, Rfl: 0 ?  benzonatate (TESSALON PERLES) 100 MG capsule, Take 1 capsule (100 mg total) by mouth 3 (three) times daily as needed for cough (cough)., Disp: 20 capsule, Rfl: 0 ?  dicyclomine (BENTYL) 20 MG tablet, Take 1 tablet (20 mg total) by mouth 3 (three) times daily as needed., Disp: 20 tablet, Rfl: 0 ?  fluticasone (FLONASE) 50 MCG/ACT nasal spray, Place 2 sprays into both nostrils daily., Disp: 9.9 g, Rfl: 0 ?  hydrOXYzine (ATARAX) 10 MG tablet, Take 1 tablet (10 mg total) by mouth at bedtime as needed for anxiety., Disp: 30 tablet, Rfl: 0 ?  Multiple Vitamin (MULTIVITAMIN WITH MINERALS) TABS tablet, Take 1 tablet by mouth daily., Disp: , Rfl:  ?  ondansetron (ZOFRAN ODT) 4 MG disintegrating tablet, Take 1 tablet (4 mg total) by mouth every 8 (eight) hours as needed. (Patient not taking: Reported on 07/05/2021), Disp: 20 tablet, Rfl: 0 ?  pantoprazole (PROTONIX) 40 MG tablet, Take 1 tablet (40 mg total) by mouth daily. (Patient taking differently: Take 40 mg by mouth 2 (two) times daily.), Disp: 30 tablet, Rfl: 0 ?  polyvinyl alcohol (LIQUIFILM TEARS) 1.4 % ophthalmic solution, Place 1 drop into both eyes as needed for dry eyes., Disp: , Rfl:  ?  SUMAtriptan (IMITREX) 100 MG tablet, Take 1 tablet (100 mg  total) by mouth every 2 (two) hours as needed for up to 15 days for migraine. May repeat in 2 hours if headache persists or recurs. Do not exceed 2 doses in 24 hours., Disp: 15 tablet, Rfl: 0 ?  venlafaxine XR (EFFEXOR-XR) 37.5 MG 24 hr capsule, Take 3 capsules (112.5 mg total) by mouth daily. For mood control, Disp: 270 capsule, Rfl: 0 ?  Vitamin D, Ergocalciferol, (DRISDOL) 1.25 MG (50000 UNIT) CAPS capsule, Take 50,000 Units by mouth once a week., Disp: , Rfl:   ? ?Medications ordered in this encounter:  ?No orders of the defined types were placed in this encounter. ?  ? ?*If you need refills on other medications prior to your next appointment, please contact your pharmacy* ? ?Follow-Up: ?Call back or seek an in-person evaluation if the symptoms worsen or if the condition fails to improve as anticipated. ? ?Other Instructions ?Based on what you shared with me, I feel your condition warrants further evaluation as soon as possible at an Emergency department.  ?  ?NOTE: There will be NO CHARGE for this eVisit ?  ?If you are having a true medical emergency please call 911.   ?  ? ?Emergency [facility]  ?Get Driving Directions  ?[phone]  ?[address]  ?[place], [place] [address]  ?Open 24/7/365  ?  ?  ?[facility]  ?Get Driving Directions  ?[place]  ?[place],  [place] [address]  ?Open 24/7/365  ?  ?Emergency [facility]  ?Get Driving Directions  ?[phone]  ?[address]  ?[place], [place] [address]  ?Open 24/7/365  ?  ?  ?[facility] Emergency Department at [facility]  ?Get Driving Directions  ?[phone]  ?[address]  ?[place], [place] [address]  ?Open 24/7/365  ?  ?[place]  ?Emergency [facility]  ?Get Driving Directions  ?[phone]  ?[address]  ?[place], [place] [address]  ?Open 24/7/365  ?  ?[place]  ?Emergency  [place]  ?Get Driving Directions  ?[place]  ?[place], [place] [address]  ?Open 24/7/365  ?  ?[place]  ?Emergency [facility]  ?Get Driving Directions  ?[phone]  ?[address]  ?[place],  [address]  ?Open 24/7/365  ? ? ? ? ?If you have been instructed to have an in-person evaluation today at a local Urgent Care facility, please use the link below. It will take you to a list of all of our available [facility], including address, phone number and hours of operation. Please do not delay care.  ?[facility] ? ?If you or a family member do not have a primary care provider, use the link below to schedule a visit and establish care. When you choose a [facility] primary care physician or advanced practice provider, you gain a long-term partner in health. ?Find a Primary Care Provider ? ?Learn more about [facility]'s in-office and virtual care options: ?[facility]  ?

## 2021-12-15 ENCOUNTER — Telehealth: Payer: Self-pay | Admitting: Internal Medicine

## 2021-12-15 NOTE — Telephone Encounter (Signed)
?  Per MyChart scheduling message: ? ?Reason for request is chest pain and rapid heart rate ? ?Pt c/o of Chest Pain: STAT if CP now or developed within 24 hours ? ?1. Are you having CP right now?  ? ?2. Are you experiencing any other symptoms (ex. SOB, nausea, vomiting, sweating)?  ? ?3. How long have you been experiencing CP?  ? ?4. Is your CP continuous or coming and going?  ? ?5. Have you taken Nitroglycerin?  ? ? ?There are coming and going. I am on no medication right now. I have been to the er twice in the past week trying to figure out what is causing it. ??  ?

## 2021-12-15 NOTE — Telephone Encounter (Signed)
Called pt in regards to my chart message for CP.  Pt reports has had chest pressure for years however over the past week has gotten worse.  Pt has been evaluated at the ED twice in the past week.  Reports chest pressure that occurs with/ without activity.  Also, notes HR jumps around from 70-105 with no activity.  Does not check BP.  Drinks 1 12 oz Dr. Malachi Bonds daily.  Pt would like to be seen placed on DOD schedule to be seen.   ?

## 2021-12-17 ENCOUNTER — Ambulatory Visit: Payer: 59 | Admitting: Cardiovascular Disease

## 2021-12-20 ENCOUNTER — Other Ambulatory Visit: Payer: Self-pay

## 2021-12-20 ENCOUNTER — Ambulatory Visit (INDEPENDENT_AMBULATORY_CARE_PROVIDER_SITE_OTHER): Payer: 59

## 2021-12-20 ENCOUNTER — Ambulatory Visit: Payer: 59 | Admitting: Interventional Cardiology

## 2021-12-20 ENCOUNTER — Encounter: Payer: Self-pay | Admitting: Interventional Cardiology

## 2021-12-20 VITALS — BP 110/70 | HR 96 | Ht 65.5 in | Wt 181.0 lb

## 2021-12-20 DIAGNOSIS — R55 Syncope and collapse: Secondary | ICD-10-CM | POA: Diagnosis not present

## 2021-12-20 DIAGNOSIS — R072 Precordial pain: Secondary | ICD-10-CM

## 2021-12-20 DIAGNOSIS — R404 Transient alteration of awareness: Secondary | ICD-10-CM

## 2021-12-20 DIAGNOSIS — Z8249 Family history of ischemic heart disease and other diseases of the circulatory system: Secondary | ICD-10-CM | POA: Diagnosis not present

## 2021-12-20 MED ORDER — METOPROLOL TARTRATE 100 MG PO TABS: ORAL_TABLET | ORAL | 0 refills | Status: DC

## 2021-12-20 NOTE — Patient Instructions (Addendum)
Medication Instructions:  ?Your physician recommends that you continue on your current medications as directed. Please refer to the Current Medication list given to you today. ? ?*If you need a refill on your cardiac medications before your next appointment, please call your pharmacy* ? ? ?Lab Work: ?none ?If you have labs (blood work) drawn today and your tests are completely normal, you will receive your results only by: ?MyChart Message (if you have MyChart) OR ?A paper copy in the mail ?If you have any lab test that is abnormal or we need to change your treatment, we will call you to review the results. ? ? ?Testing/Procedures: ?Your physician has requested that you have an echocardiogram. Echocardiography is a painless test that uses sound waves to create images of your heart. It provides your doctor with information about the size and shape of your heart and how well your heart?s chambers and valves are working. This procedure takes approximately one hour. There are no restrictions for this procedure. ? ?Your physician has requested that you have cardiac CT. Cardiac computed tomography (CT) is a painless test that uses an x-ray machine to take clear, detailed pictures of your heart. For further information please visit [email or web address]. Please follow instruction sheet as given. ? ?Dr Irish Lack recommends you wear a zio monitor for 2 weeks ? ? ?Follow-Up: ?At [facility], you and your health needs are our priority.  As part of our continuing mission to provide you with exceptional heart care, we have created designated Provider Care Teams.  These Care Teams include your primary Cardiologist (physician) and Advanced Practice Providers (APPs -  Physician Assistants and Nurse Practitioners) who all work together to provide you with the care you need, when you need it. ? ?We recommend signing up for the patient portal called "MyChart".  Sign up information is provided on this After Visit Summary.  MyChart is  used to connect with patients for Virtual Visits (Telemedicine).  Patients are able to view lab/test results, encounter notes, upcoming appointments, etc.  Non-urgent messages can be sent to your provider as well.   ?To learn more about what you can do with MyChart, go to [email or web address].   ? ?Your next appointment:   ?As needed ? ?The format for your next appointment:   ?In Person ? ?Provider:   ?Larae Grooms, MD   ? ? ?Other Instructions ? ? ?Your cardiac CT will be scheduled at one of the below locations:  ? ?[facility] ?[address] ?[place], [place] [address] ?[phone] ? ?OR ? ?[facility] ?[address] ?[place], [place] [address] ?[phone] ?If scheduled at [facility], please arrive at the [facility] and Children's Entrance (Entrance C2) of [facility] 30 minutes prior to test start time. ?You can use the FREE valet parking offered at entrance C (encouraged to control the heart rate for the test)  ?Proceed to the [facility] Radiology Department (first floor) to check-in and test prep. ? ?All radiology patients and guests should use entrance C2 at [facility], accessed from [facility], even though the hospital's physical address listed is [address]. ? ? ? ?If scheduled at [facility], please arrive 15 mins early for check-in and test prep. ? ?Please follow these instructions carefully (unless otherwise directed): ? ?Hold all erectile dysfunction medications at least 3 days (72 hrs) prior to test. ? ?On the Night Before the Test: ?Be sure to  Drink plenty of water. ?Do not consume any caffeinated/decaffeinated beverages or chocolate 12 hours prior to your test. ?Do not take any antihistamines 12 hours prior to your test. ? ?On the Day of the Test: ?Drink plenty of water until 1 hour prior to the test. ?Do not eat any food 4 hours prior to the test. ?You  may take your regular medications prior to the test.  ?Take metoprolol (Lopressor) two hours prior to test. ?HOLD Furosemide/Hydrochlorothiazide morning of the test. ?FEMALES- please wear underwire-free bra if available, avoid dresses & tight clothing ? ? ?     ?After the Test: ?Drink plenty of water. ?After receiving IV contrast, you may experience a mild flushed feeling. This is normal. ?On occasion, you may experience a mild rash up to 24 hours after the test. This is not dangerous. If this occurs, you can take Benadryl 25 mg and increase your fluid intake. ?If you experience trouble breathing, this can be serious. If it is severe call 911 IMMEDIATELY. If it is mild, please call our office. ?If you take any of these medications: Glipizide/Metformin, Avandament, Glucavance, please do not take 48 hours after completing test unless otherwise instructed. ? ?We will call to schedule your test 2-4 weeks out understanding that some insurance companies will need an authorization prior to the service being performed.  ? ?For non-scheduling related questions, please contact the cardiac imaging nurse navigator should you have any questions/concerns: ?Marchia Bond, Cardiac Imaging Nurse Navigator ?Gordy Clement, Cardiac Imaging Nurse Navigator ?[facility] ?Direct Office Dial: [phone]  ? ?For scheduling needs, including cancellations and rescheduling, please call [place], [phone]. ? ?ZIO XT- Long Term Monitor Instructions ? ?Your physician has requested you wear a ZIO patch monitor for 14 days.  ?This is a single patch monitor. [employer] supplies one patch monitor per enrollment. Additional ?stickers are not available. Please do not apply patch if you will be having a Nuclear Stress Test,  ?Echocardiogram, Cardiac CT, MRI, or Chest Xray during the period you would be wearing the  ?monitor. The patch cannot be worn during these tests. You cannot remove and re-apply the  ?ZIO XT patch  monitor.  ?Your ZIO patch monitor will be mailed 3 day USPS to your address on file. It may take 3-5 days  ?to receive your monitor after you have been enrolled.  ?Once you have received your monitor, please review the enclosed instructions. Your monitor  ?has already been registered assigning a specific monitor serial # to you. ? ?Billing and Patient Assistance Program Information ? ?We have supplied [employer] with any of your insurance information on file for billing purposes. ?[employer] offers a sliding scale Patient Assistance Program for patients that do not have  ?insurance, or whose insurance does not completely cover the cost of the ZIO monitor.  ?You must apply for the Patient Assistance Program to qualify for this discounted rate.  ?To apply, please call [employer] at [phone], select option 4, select option 2, ask to apply for  ?Patient Assistance Program. Theodore Demark will ask your household income, and how many people  ?are in your household. They will quote your out-of-pocket cost based on that information.  ?[employer] will also be able to set up a 20-month interest-free payment plan if needed. ? ?Applying the monitor ?  ?Shave hair from upper left chest.  ?Hold abrader disc by orange tab. Rub abrader in 40 strokes over the upper left chest as  ?indicated in your monitor instructions.  ?Clean  area with 4 enclosed alcohol pads. Let dry.  ?Apply patch as indicated in monitor instructions. Patch will be placed under collarbone on left  ?side of chest with arrow pointing upward.  ?Rub patch adhesive wings for 2 minutes. Remove white label marked "1". Remove the white  ?label marked "2". Rub patch adhesive wings for 2 additional minutes.  ?While looking in a mirror, press and release button in center of patch. A small green light will  ?flash 3-4 times. This will be your only indicator that the monitor has been turned on.  ?Do not shower for the first 24 hours. You may shower after the first 24 hours.  ?Press the  button if you feel a symptom. You will hear a small click. Record Date, Time and  ?Symptom in the Patient Logbook.  ?When you are ready to remove the patch, follow instructions on the last 2 pages of Patient  ?
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
## 2021-12-20 NOTE — Progress Notes (Unsigned)
Enrolled for [employer] to mail a ZIO XT long term holter monitor to the patients address on file.  

## 2021-12-27 ENCOUNTER — Ambulatory Visit: Payer: 59 | Admitting: Internal Medicine

## 2021-12-28 ENCOUNTER — Other Ambulatory Visit: Payer: Self-pay

## 2021-12-28 ENCOUNTER — Ambulatory Visit (HOSPITAL_COMMUNITY): Payer: 59 | Attending: Internal Medicine

## 2021-12-28 DIAGNOSIS — R072 Precordial pain: Secondary | ICD-10-CM | POA: Diagnosis present

## 2021-12-28 DIAGNOSIS — R55 Syncope and collapse: Secondary | ICD-10-CM | POA: Diagnosis not present

## 2021-12-28 LAB — ECHOCARDIOGRAM COMPLETE
Area-P 1/2: 8.62 cm2
S' Lateral: 2.7 cm

## 2022-01-03 ENCOUNTER — Encounter (HOSPITAL_COMMUNITY): Payer: Self-pay | Admitting: Psychiatry

## 2022-01-03 ENCOUNTER — Telehealth (HOSPITAL_COMMUNITY): Payer: Self-pay | Admitting: Emergency Medicine

## 2022-01-03 ENCOUNTER — Telehealth (HOSPITAL_BASED_OUTPATIENT_CLINIC_OR_DEPARTMENT_OTHER): Payer: 59 | Admitting: Psychiatry

## 2022-01-03 DIAGNOSIS — F419 Anxiety disorder, unspecified: Secondary | ICD-10-CM

## 2022-01-03 DIAGNOSIS — F319 Bipolar disorder, unspecified: Secondary | ICD-10-CM | POA: Diagnosis not present

## 2022-01-03 MED ORDER — ARIPIPRAZOLE 5 MG PO TABS: 5.0000 mg | ORAL_TABLET | Freq: Every day | ORAL | 0 refills | Status: DC

## 2022-01-03 MED ORDER — VENLAFAXINE HCL ER 37.5 MG PO CP24: 112.5000 mg | ORAL_CAPSULE | Freq: Every day | ORAL | 0 refills | Status: DC

## 2022-01-03 MED ORDER — HYDROXYZINE HCL 10 MG PO TABS: 10.0000 mg | ORAL_TABLET | Freq: Every evening | ORAL | 0 refills | Status: DC | PRN
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
## 2022-01-03 NOTE — Telephone Encounter (Signed)
Attempted to call patient regarding upcoming cardiac CT appointment. °Left message on voicemail with name and callback number °Yannick Steuber RN Navigator Cardiac Imaging °[facility] °[phone] Office °[phone] Cell ° °

## 2022-01-04 ENCOUNTER — Telehealth (HOSPITAL_COMMUNITY): Payer: Self-pay | Admitting: Emergency Medicine

## 2022-01-04 ENCOUNTER — Other Ambulatory Visit (HOSPITAL_COMMUNITY): Payer: Self-pay

## 2022-01-04 DIAGNOSIS — R072 Precordial pain: Secondary | ICD-10-CM

## 2022-01-04 MED ORDER — IVABRADINE HCL 5 MG PO TABS
10.0000 mg | ORAL_TABLET | Freq: Once | ORAL | 0 refills | Status: AC
  Filled 2022-01-04: qty 2, 1d supply, fill #0

## 2022-01-04 MED ORDER — IVABRADINE HCL 5 MG PO TABS: 10.0000 mg | ORAL_TABLET | Freq: Once | ORAL | 0 refills | Status: DC

## 2022-01-04 NOTE — Telephone Encounter (Signed)
Attempted to call patient regarding upcoming cardiac CT appointment. °Left message on voicemail with name and callback number °Kalmen Lollar RN Navigator Cardiac Imaging °[facility] °[phone] Office °[phone] Cell ° °

## 2022-01-04 NOTE — Telephone Encounter (Signed)
Ivabradine (corlanor) not available at patients preferred pharm - submitted new rx to [facility] outpatient pharm. Pt aware and agreeable.  ? ?Marchia Bond RN Navigator Cardiac Imaging ?[facility] ?[phone] Office  ?[phone] Cell ? ?

## 2022-01-04 NOTE — Telephone Encounter (Signed)
Reaching out to patient to offer assistance regarding upcoming cardiac imaging study; pt verbalizes understanding of appt date/time, parking situation and where to check in, pre-test NPO status and medications ordered, and verified current allergies; name and call back number provided for further questions should they arise ?Marchia Bond RN Navigator Cardiac Imaging ?[facility] ?[phone] office ?[phone] cell ? ?Pt reported smart watch HR 104 while on phone. Decided to add '10mg'$  ivabradine to take with '100mg'$  metoprolol tartrate for CCTA HR control. Pt verbalized understanding ?Denies iv issues ?Arrival 400 ?

## 2022-01-05 ENCOUNTER — Ambulatory Visit (HOSPITAL_COMMUNITY)
Admission: RE | Admit: 2022-01-05 | Discharge: 2022-01-05 | Disposition: A | Discharge status: Home / Self Care | Payer: 59 | Source: Ambulatory Visit | Attending: Interventional Cardiology | Admitting: Interventional Cardiology

## 2022-01-05 DIAGNOSIS — R55 Syncope and collapse: Secondary | ICD-10-CM | POA: Diagnosis not present

## 2022-01-05 DIAGNOSIS — R072 Precordial pain: Secondary | ICD-10-CM | POA: Insufficient documentation

## 2022-01-05 MED ORDER — METOPROLOL TARTRATE 5 MG/5ML IV SOLN
5.0000 mg | INTRAVENOUS | Status: DC | PRN
  Administered 2022-01-05: 5 mg via INTRAVENOUS

## 2022-01-05 MED ORDER — IOHEXOL 350 MG/ML SOLN
100.0000 mL | Freq: Once | INTRAVENOUS | Status: AC | PRN
  Administered 2022-01-05: 100 mL via INTRAVENOUS

## 2022-01-05 MED ORDER — DILTIAZEM HCL 25 MG/5ML IV SOLN: 5.0000 mg | INTRAVENOUS | Status: DC | PRN

## 2022-01-05 MED ORDER — NITROGLYCERIN 0.4 MG SL SUBL
0.8000 mg | SUBLINGUAL_TABLET | Freq: Once | SUBLINGUAL | Status: AC
  Administered 2022-01-05: 0.8 mg via SUBLINGUAL

## 2022-01-05 MED ORDER — NITROGLYCERIN 0.4 MG SL SUBL
SUBLINGUAL_TABLET | SUBLINGUAL | Status: AC
  Filled 2022-01-05: qty 2

## 2022-01-05 MED ORDER — METOPROLOL TARTRATE 5 MG/5ML IV SOLN
INTRAVENOUS | Status: AC
  Filled 2022-01-05: qty 10

## 2022-01-05 MED ORDER — DILTIAZEM HCL 25 MG/5ML IV SOLN
INTRAVENOUS | Status: AC
  Administered 2022-01-05: 10 mg via INTRAVENOUS
  Filled 2022-01-05: qty 5

## 2022-01-05 NOTE — Progress Notes (Signed)
Pt's heart rate remains in the 80s range even after IV Metoprolol and IV Diltiazem given. Pt also reports taking prescribed PO metoprolol and PO Ivabradine prior to arrival. Dr. Debara Pickett notified. Per MD, continue with the scan at this time.  ?

## 2022-01-11 ENCOUNTER — Encounter: Payer: Self-pay | Admitting: Neurology

## 2022-02-01 ENCOUNTER — Other Ambulatory Visit (HOSPITAL_COMMUNITY): Payer: Self-pay | Admitting: Psychiatry

## 2022-02-01 DIAGNOSIS — F419 Anxiety disorder, unspecified: Secondary | ICD-10-CM
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
## 2022-03-03 ENCOUNTER — Encounter: Payer: Self-pay | Admitting: Neurology

## 2022-03-03 ENCOUNTER — Ambulatory Visit (INDEPENDENT_AMBULATORY_CARE_PROVIDER_SITE_OTHER): Payer: 59 | Admitting: Neurology

## 2022-03-03 VITALS — BP 137/105 | HR 89 | Ht 65.5 in | Wt 179.0 lb

## 2022-03-03 DIAGNOSIS — M542 Cervicalgia: Secondary | ICD-10-CM

## 2022-03-03 DIAGNOSIS — G43109 Migraine with aura, not intractable, without status migrainosus: Secondary | ICD-10-CM

## 2022-03-03 DIAGNOSIS — M5481 Occipital neuralgia: Secondary | ICD-10-CM | POA: Diagnosis not present

## 2022-03-03 DIAGNOSIS — R404 Transient alteration of awareness: Secondary | ICD-10-CM

## 2022-03-03 DIAGNOSIS — R55 Syncope and collapse: Secondary | ICD-10-CM

## 2022-03-03 NOTE — Addendum Note (Signed)
Addended by: Jake Seats on: 03/03/2022 10:12 AM   Modules accepted: Orders
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
## 2022-03-10 ENCOUNTER — Ambulatory Visit: Payer: 59 | Attending: Neurology

## 2022-03-10 DIAGNOSIS — R42 Dizziness and giddiness: Secondary | ICD-10-CM | POA: Diagnosis present

## 2022-03-10 DIAGNOSIS — R404 Transient alteration of awareness: Secondary | ICD-10-CM | POA: Diagnosis not present

## 2022-03-10 DIAGNOSIS — R55 Syncope and collapse: Secondary | ICD-10-CM | POA: Diagnosis not present

## 2022-03-10 DIAGNOSIS — M6281 Muscle weakness (generalized): Secondary | ICD-10-CM | POA: Insufficient documentation

## 2022-03-10 DIAGNOSIS — R293 Abnormal posture: Secondary | ICD-10-CM | POA: Insufficient documentation

## 2022-03-10 DIAGNOSIS — G43109 Migraine with aura, not intractable, without status migrainosus: Secondary | ICD-10-CM | POA: Diagnosis not present

## 2022-03-10 DIAGNOSIS — M542 Cervicalgia: Secondary | ICD-10-CM | POA: Diagnosis present

## 2022-03-10 DIAGNOSIS — M5481 Occipital neuralgia: Secondary | ICD-10-CM | POA: Diagnosis not present

## 2022-03-10 NOTE — Therapy (Signed)
OUTPATIENT PHYSICAL THERAPY VESTIBULAR EVALUATION     Patient Name: Shannon Kemp MRN: [id number] DOB:Jul 30, 1976, 46 y.o., female Today's Date: 03/10/2022  PCP: Park Meo, PA-C REFERRING PROVIDER: Pieter Partridge, DO    PT End of Session - 03/10/22 1401     Visit Number 1    Number of Visits 1    PT Start Time 1400    PT Stop Time 1448    PT Time Calculation (min) 48 min    Activity Tolerance Patient tolerated treatment well    Behavior During Therapy [facility] for tasks assessed/performed;Anxious;Restless             Past Medical History:  Diagnosis Date   Allergy    Breast pain    Bruises easily    Chest pain    Fatigue    Generalized headaches    Nipple discharge    Pneumothorax    Sleep difficulties    SOB (shortness of breath)    Syncope    Weight increase    Past Surgical History:  Procedure Laterality Date   BILATERAL OOPHORECTOMY     LAPAROSCOPY  04/01/2012   Procedure: LAPAROSCOPY OPERATIVE;  Surgeon: Claiborne Billings A. Pamala Hurry, MD;  Location: [facility];  Service: Gynecology;  Laterality: N/A;  Operative laparoscopy/right salpingectomy and removal of ectopic pregnancy   LAPAROSCOPY Left 05/09/2013   Procedure: LAPAROSCOPY OPERATIVE/ECTOPIC;  Surgeon: Linda Hedges, DO;  Location: [facility];  Service: Gynecology;  Laterality: Left;  WITH LEFT SALPINGECTOMY   ORIF ULNAR FRACTURE     PLEURAL SCARIFICATION     Patient Active Problem List   Diagnosis Date Noted   Globus sensation 01/18/2021   Dysphagia 01/18/2021   Gastroesophageal reflux disease with esophagitis 01/18/2021   Hiatal hernia 01/18/2021   Lower abdominal pain 01/18/2021   Major depression 07/19/2017   Bipolar disorder ([place]) 07/18/2017    ONSET DATE: 12/08/21  REFERRING DIAG: R40.4 (ICD-10-CM) - Transient alteration of awareness R55 (ICD-10-CM) - Syncope, unspecified syncope type G43.109 (ICD-10-CM) - Migraine with aura and without status migrainosus, not intractable M54.81 (ICD-10-CM) - Bilateral  occipital neuralgia M54.2 (ICD-10-CM) - Cervicalgia   THERAPY DIAG:  Cervicalgia  Abnormal posture  Muscle weakness (generalized)  Dizziness and giddiness  Rationale for Evaluation and Treatment Rehabilitation  SUBJECTIVE:   SUBJECTIVE STATEMENT: "I'm doing the best I can" Patient reports that she wakes up every AM with B occipital "burning" pain rated at an 8/10. This has been occurring for ~2-3 years. She also reports temperature dysregulation at times with reports of "hot flashes" without clear medical cause (menopause). Patient reports that these symptoms have been exacerbated since an "episode" that occurred 3/8 where she "zoned out" in her car for ~3 mins (LOC?). She denies incontinence with this. She reports that she felt "off" prior to this event, but was unable to clarify further. She does report intermittent tunnel vision preceding migraines, which she has had for multiple years. Patient reports that since 3/8 she has had "random" muscle twitches in all 4 extremities- this does not seem to precede a "zoning out episode" or a migraine, but rather occurs randomly. Patient reporting that when her B LE are in 1 position for an extended period of time and then she goes to move, she feels as though she has to "unlock" them. Patient also reporting intermittent weakness in her grip, stating that at times her pen falls out of her hand while she's writing. Patient with "random blurred vision" that does get worse with a HA.  Patient reports "buzzing" in her ears intermittently, but is unable to state if this is associated with her "zoning out episodes" or with a migraine.   Pt accompanied by: self  PERTINENT HISTORY: Bi-occipital pain radiating down the neck and into shoulders.  Sometimes preceded by squiggly lines in vision.  Burning in back of head.  Intermittent left sided facial numbness and tingling, intermittent bilateral blurred vision.  Due to the numbness, it was difficult to talk, Bipolar  I disorder  PAIN:  Are you having pain? Yes: NPRS scale: 6/10 Pain location: suboccipital radiating into B shoulders  Pain description: burning, throbbing  Aggravating factors: sleeping, laying down, "curling up in fetal position"  Relieving factors: TENS unit, sitting up  Vitals:  -BP: 121/95  PRECAUTIONS: Fall  WEIGHT BEARING RESTRICTIONS No  FALLS: Has patient fallen in last 6 months? Yes. Number of falls 2; felt dizzy and "went down", denies hitting head or injuries   LIVING ENVIRONMENT: Lives with: lives with their family Lives in: House/apartment Stairs: Yes: Internal: 2 steps; on right going up and External: a few  steps; on right going up Has following equipment at home: None  PLOF: Independent  PATIENT GOALS "stop hurting"  OBJECTIVE:   DIAGNOSTIC FINDINGS:  MRI to be completed 6/17 EEG vs ambulatory EEG to be scheduled   COGNITION: Overall cognitive status: Difficulty to assess   SENSATION: WFL  MUSCLE TONE: WFL  OTHER TESTING: -(+) Roos test, L >R UE  -(-) Spurling  -(-) Lhermitte's sign   -(-) Babinski   -(-)Clonus  POSTURE: rounded shoulders, forward head, and posterior pelvic tilt   Cervical ROM:  WFL  Active A/PROM (deg) eval  Flexion   Extension   Right lateral flexion   Left lateral flexion   Right rotation   Left rotation   (Blank rows = not tested)  LOWER EXTREMITY MMT:   MMT Right eval Left eval  Hip flexion 4 3+  Hip abduction 4 4  Hip adduction 4 4  Hip internal rotation    Hip external rotation    Knee flexion 4 3+  Knee extension 4 3+  Ankle dorsiflexion 4 3+  Ankle plantarflexion 4 4  Ankle inversion    Ankle eversion    (Blank rows = not tested)   TRANSFERS: Assistive device utilized: None  Sit to stand: Complete Independence Stand to sit: Complete Independence Chair to chair: Complete Independence  GAIT: Gait pattern: WFL Distance walked: clinic distance Assistive device utilized: None Level of  assistance: Complete Independence Comments: Patient with apparently slower than expected gait speed given age.    VESTIBULAR ASSESSMENT    SYMPTOM BEHAVIOR:   Subjective history: see subjective above   Non-Vestibular symptoms: changes in hearing, changes in vision, diplopia, neck pain, headaches, migraine symptoms, and loss of consciousness(?)   Type of dizziness: Lightheadedness/Faint and "Funny feeling in the head"   Frequency: "sometimes daily"   Duration: varies   Aggravating factors: No known aggravating factors   Relieving factors: no known relieving factors   Progression of symptoms: unchanged   OCULOMOTOR EXAM:   Ocular Alignment: normal   Ocular ROM: No Limitations   Spontaneous Nystagmus: absent   Gaze-Induced Nystagmus: absent   Smooth Pursuits: intact   Saccades: hypometric/undershoots    VESTIBULAR - OCULAR REFLEX:    Slow VOR: Comment: diffifculty focusing   VOR Cancellation: Comment: difficulty focusing     PATIENT EDUCATION: Education details: Testing results, PT dx, request for further imaging prior to establishing PT  POC Person educated: Patient Education method: Explanation Education comprehension: verbalized understanding   GOALS: Goals to be established upon return to therapy after further medical imaging/workup complete  ASSESSMENT:  CLINICAL IMPRESSION: Patient is a 46 y.o. female who was seen today for physical therapy evaluation and treatment for neuralgia/cervicalgia. Patient with unclear full medical picture at this time given symptom presentation. She would benefit from further medical follow up/imaging to rule out central causes for her reported dizziness and other reported symptoms. No PT warranted at this time until return of testing. Patient agreeable to return upon receiving results.    OBJECTIVE IMPAIRMENTS decreased balance, decreased endurance, decreased knowledge of condition, decreased mobility, decreased strength, increased muscle  spasms, impaired sensation, and pain.   ACTIVITY LIMITATIONS carrying, lifting, bending, standing, squatting, sleeping, stairs, bed mobility, locomotion level, and caring for others  PARTICIPATION LIMITATIONS: cleaning, interpersonal relationship, driving, shopping, community activity, occupation, and yard work  PERSONAL FACTORS Behavior pattern, Fitness, Past/current experiences, Time since onset of injury/illness/exacerbation, Transportation, and 1-2 comorbidities: migraines, Bipolar I  are also affecting patient's functional outcome.   REHAB POTENTIAL: Fair time since onset  CLINICAL DECISION MAKING: Evolving/moderate complexity  EVALUATION COMPLEXITY: Moderate   PLAN: PT FREQUENCY: one time visit  PT DURATION: eval only; additional visits to be determined after imaging results  PLANNED INTERVENTIONS: Therapeutic exercises, Therapeutic activity, Neuromuscular re-education, Balance training, Gait training, Patient/Family education, Joint mobilization, Stair training, Vestibular training, Canalith repositioning, Visual/preceptual remediation/compensation, Orthotic/Fit training, DME instructions, Aquatic Therapy, Cognitive remediation, Cryotherapy, Moist heat, Manual therapy, and Re-evaluation  PLAN FOR NEXT SESSION: re-assess patient as appropriate upon return of additional medical imaging/work up.   Debbora Dus, PT Debbora Dus, PT, DPT, CBIS  03/10/2022, 3:47 PM

## 2022-03-19 ENCOUNTER — Ambulatory Visit
Admission: RE | Admit: 2022-03-19 | Discharge: 2022-03-19 | Disposition: A | Discharge status: Home / Self Care | Payer: 59 | Source: Ambulatory Visit | Attending: Neurology | Admitting: Neurology

## 2022-03-19 DIAGNOSIS — R55 Syncope and collapse: Secondary | ICD-10-CM

## 2022-03-19 DIAGNOSIS — R404 Transient alteration of awareness: Secondary | ICD-10-CM

## 2022-03-19 MED ORDER — GADOBENATE DIMEGLUMINE 529 MG/ML IV SOLN
17.0000 mL | Freq: Once | INTRAVENOUS | Status: AC | PRN
  Administered 2022-03-19: 17 mL via INTRAVENOUS

## 2022-03-23 ENCOUNTER — Ambulatory Visit (HOSPITAL_COMMUNITY)
Admission: RE | Admit: 2022-03-23 | Discharge: 2022-03-23 | Disposition: A | Discharge status: Home / Self Care | Payer: 59 | Source: Ambulatory Visit | Attending: Neurology | Admitting: Neurology

## 2022-03-23 DIAGNOSIS — R404 Transient alteration of awareness: Secondary | ICD-10-CM | POA: Diagnosis not present

## 2022-03-23 DIAGNOSIS — R55 Syncope and collapse: Secondary | ICD-10-CM | POA: Diagnosis not present

## 2022-03-23 NOTE — Progress Notes (Addendum)
Outpatient EEG complete - results pending.  

## 2022-04-04 ENCOUNTER — Encounter (HOSPITAL_COMMUNITY): Payer: Self-pay | Admitting: Psychiatry

## 2022-04-04 ENCOUNTER — Telehealth (HOSPITAL_BASED_OUTPATIENT_CLINIC_OR_DEPARTMENT_OTHER): Payer: 59 | Admitting: Psychiatry

## 2022-04-04 VITALS — Wt 179.0 lb

## 2022-04-04 DIAGNOSIS — R252 Cramp and spasm: Secondary | ICD-10-CM | POA: Diagnosis not present

## 2022-04-04 DIAGNOSIS — F419 Anxiety disorder, unspecified: Secondary | ICD-10-CM

## 2022-04-04 DIAGNOSIS — F319 Bipolar disorder, unspecified: Secondary | ICD-10-CM | POA: Diagnosis not present

## 2022-04-04 MED ORDER — VENLAFAXINE HCL ER 37.5 MG PO CP24: 112.5000 mg | ORAL_CAPSULE | Freq: Every day | ORAL | 0 refills | Status: DC

## 2022-04-04 MED ORDER — BENZTROPINE MESYLATE 0.5 MG PO TABS: 0.5000 mg | ORAL_TABLET | Freq: Every day | ORAL | 0 refills | Status: DC

## 2022-04-04 MED ORDER — ARIPIPRAZOLE 5 MG PO TABS: 5.0000 mg | ORAL_TABLET | Freq: Every day | ORAL | 0 refills | Status: DC

## 2022-04-04 MED ORDER — HYDROXYZINE HCL 10 MG PO TABS: 10.0000 mg | ORAL_TABLET | Freq: Every evening | ORAL | 0 refills | Status: DC | PRN
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
## 2022-04-06 ENCOUNTER — Other Ambulatory Visit (HOSPITAL_COMMUNITY): Payer: Self-pay | Admitting: Psychiatry

## 2022-04-06 DIAGNOSIS — F319 Bipolar disorder, unspecified: Secondary | ICD-10-CM

## 2022-04-06 DIAGNOSIS — F419 Anxiety disorder, unspecified: Secondary | ICD-10-CM

## 2022-04-22 ENCOUNTER — Telehealth: Payer: Self-pay | Admitting: Neurology

## 2022-04-22 NOTE — Telephone Encounter (Signed)
Patient called and said she is still waiting for her EEG results and it has been about a month since the test.

## 2022-04-25 NOTE — Telephone Encounter (Signed)
Patient called back

## 2022-04-25 NOTE — Telephone Encounter (Signed)
Patient advised of her EEG.

## 2022-05-19 ENCOUNTER — Telehealth: Payer: Self-pay

## 2022-05-19 NOTE — Telephone Encounter (Signed)
EEG lab called an asked if Dr Shannon Kemp had read the pt eeg yet?

## 2022-05-20 NOTE — Procedures (Signed)
ELECTROENCEPHALOGRAM REPORT  Date of Study: 03/23/2022  Patient's Name: Shannon Kemp MRN: [id number] Date of Birth: [birth year]   Clinical History: 47 year old female with recurrent staring spells and episode of syncope  Medications: Venlafaxine Hydroxyzine Sumatriptan Abilify  Technical Summary: A multichannel digital EEG recording measured by the international 10-20 system with electrodes applied with paste and impedances below 5000 ohms performed in our laboratory with EKG monitoring in an awake and drowsy patient.  Hyperventilation and photic stimulation were performed.  The digital EEG was referentially recorded, reformatted, and digitally filtered in a variety of bipolar and referential montages for optimal display.    Description: The patient is awake and drowsy during the recording.  During maximal wakefulness, there is a symmetric, medium voltage 10 Hz posterior dominant rhythm that attenuates with eye opening.  The record is symmetric.  Stage 2 sleep was not seen..  Hyperventilation and photic stimulation did not elicit any abnormalities.  There were no epileptiform discharges or electrographic seizures seen.    EKG lead was unremarkable.  Impression: This awake and drowsy EEG is normal.    Clinical Correlation: A normal EEG does not exclude a clinical diagnosis of epilepsy.  If further clinical questions remain, prolonged EEG may be helpful.  Clinical correlation is advised.   Metta Clines, DO

## 2022-05-20 NOTE — Telephone Encounter (Signed)
EEG lab called they will send the note for Dr Tomi Likens to sign.

## 2022-05-20 NOTE — Addendum Note (Signed)
Encounter addended by: Pieter Partridge, DO on: 05/20/2022 3:50 PM  Actions taken: Clinical Note Signed, Charge Capture section accepted

## 2022-05-20 NOTE — Addendum Note (Signed)
Encounter addended by: Charyl Bigger on: 05/20/2022 1:48 PM  Actions taken: Clinical Note Signed

## 2022-05-31 ENCOUNTER — Ambulatory Visit: Payer: 59 | Admitting: Neurology

## 2022-06-07 ENCOUNTER — Telehealth (HOSPITAL_BASED_OUTPATIENT_CLINIC_OR_DEPARTMENT_OTHER): Payer: 59 | Admitting: Psychiatry

## 2022-06-07 ENCOUNTER — Encounter (HOSPITAL_COMMUNITY): Payer: Self-pay | Admitting: Psychiatry

## 2022-06-07 DIAGNOSIS — F319 Bipolar disorder, unspecified: Secondary | ICD-10-CM

## 2022-06-07 DIAGNOSIS — R252 Cramp and spasm: Secondary | ICD-10-CM

## 2022-06-07 DIAGNOSIS — F419 Anxiety disorder, unspecified: Secondary | ICD-10-CM | POA: Diagnosis not present

## 2022-06-07 MED ORDER — BENZTROPINE MESYLATE 0.5 MG PO TABS
0.5000 mg | ORAL_TABLET | Freq: Every day | ORAL | 0 refills | Status: DC
Start: 2022-06-07 — End: 2022-09-05

## 2022-06-07 MED ORDER — VENLAFAXINE HCL ER 37.5 MG PO CP24: 112.5000 mg | ORAL_CAPSULE | Freq: Every day | ORAL | 0 refills | Status: DC

## 2022-06-07 MED ORDER — ARIPIPRAZOLE 5 MG PO TABS: 5.0000 mg | ORAL_TABLET | Freq: Every day | ORAL | 0 refills | Status: DC
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
## 2022-07-05 ENCOUNTER — Telehealth (HOSPITAL_COMMUNITY): Payer: 59 | Admitting: Psychiatry

## 2022-07-11 NOTE — Progress Notes (Deleted)
NEUROLOGY FOLLOW UP OFFICE NOTE  PRESLEI BLAKLEY [id number]  Assessment/Plan:     Recurrent staring spells, episode of syncope.  Migraine with aura, without status migrainosus, not intractable Bilateral occipital neuralgia, cervicalgia   MRI of brain with and without contrast Routine EEG.  If unremarkable, 24 ambulatory EEG Advised to use sumatriptan as first line for migraine rescue. Discussed [place] law stating no driving for 6 months after unexplained loss of consciousness or awareness Refer to physical therapy for neck pain/occipital neuralgia Follow up 4 months.   Subjective:  SADEY YANDELL is a 46 year old female with migraines and Bipolar I disorder who follows up for headache and staring spells..  She is accompanied by her husband who supplements history.    UPDATE: MRI of brain with and without contrast on 03/19/2022 personally reviewed was normal.  EEG on 03/23/2022 was normal.  Would have pursued ambulatory EEG but our current services do not take her insurance.    ***  Current NSAIDS: none Current analgesics:  Acetaminophen (only dulls pain) Current triptans:  no Current ergotamine:  no Current anti-emetic:  no Current muscle relaxants:  no Current anti-anxiolytic:  hydroxyzine Current sleep aide:  no Current Antihypertensive medications:  no Current Antidepressant medications:  Venlafaxine XR 112.'5mg'$  Current Anticonvulsant medications:  no Current anti-CGRP:  no Current Vitamins/Herbal/Supplements:  no Current Antihistamines/Decongestants:  no Other therapy:  no Hormone/birth control:  no Other medication:  Abilify '5mg'$   Caffeine: Dr. Malachi Bonds daily Alcohol:  no Smoker:  no Diet:  Dr. Malachi Bonds daily.  Drinks 2-3 cirkul bottles of water daily. Exercise:  Not routine Depression:  controlled; Anxiety:  controlled Other pain:  Occasionally allodynia of entire body Sleep hygiene:  poor   HISTORY: Longstanding history of daily chronic migraines for many  years.  Bi-occipital pain radiating down the neck and into shoulders.  Sometimes preceded by squiggly lines in vision.  Burning in back of head.  Intermittent left sided facial numbness and tingling, intermittent bilateral blurred vision.  Due to the numbness, it was difficult to talk.  She denies associated nausea, vomiting or unilateral numbness or weakness of extremities.  Prior workup included MRI of brain and CTA of head and neck on 06/04/2018 which were personally reviewed and unremarkable.  Initially were daily.  They now occur every 2 weeks.  They last no more than a day.  Will take Tylenol first line and sumatriptan second line.  However, still often has neck pain and burning in back of head especially when she wakes up in the morning.   Since January-February 2023, she has had staring spells at work.  Usually lasts 2-3 minutes.  It has occurred at least 3-4 times.   On 12/08/2021, she was driving with a headache when she suddenly felt like she was going to pass out.  She pulled over and lost consciousness for about 3 minutes.  No tongue biting, incontinence or confusion.  Prior concussion in 2017.  Denies history of seizures.  She has baseline tachycardia.  She was seen in the ED later that month for chest pain.  She followed up with cardiology.  Workup including echo, ZIO patch and CTA of coronaries was unremarkable.    Past NSAIDS:  Naproxen '500mg'$ , ibuprofen Past analgesics:  Tylenol Past abortive triptans:  sumatriptan '100mg'$  (effective) Past abortive ergotamine:  no Past muscle relaxants:  Robaxin '500mg'$  Past anti-emetic:  Zofran ODT '4mg'$  Past antihypertensive medications:  no Past antidepressant medications:  mirtazepine Past anticonvulsant medications:  no  Past anti-CGRP:  no Past vitamins/Herbal/Supplements:  no Past antihistamines/decongestants:  no Other past therapies:  no    Family history of headache:  no  PAST MEDICAL HISTORY: Past Medical History:  Diagnosis Date   Allergy     Breast pain    Bruises easily    Chest pain    Fatigue    Generalized headaches    Nipple discharge    Pneumothorax    Sleep difficulties    SOB (shortness of breath)    Syncope    Weight increase     MEDICATIONS: Current Outpatient Medications on File Prior to Visit  Medication Sig Dispense Refill   acetaminophen (TYLENOL) 325 MG tablet Take 650 mg by mouth every 6 (six) hours as needed for mild pain, fever or headache.     ARIPiprazole (ABILIFY) 5 MG tablet Take 1 tablet (5 mg total) by mouth daily. 90 tablet 0   benztropine (COGENTIN) 0.5 MG tablet Take 1 tablet (0.5 mg total) by mouth at bedtime. 90 tablet 0   fluticasone (FLONASE) 50 MCG/ACT nasal spray Place 2 sprays into both nostrils daily. (Patient taking differently: Place 2 sprays into both nostrils as needed.) 9.9 g 0   hydrOXYzine (ATARAX) 10 MG tablet Take 1 tablet (10 mg total) by mouth at bedtime as needed for anxiety. 30 tablet 0   metoprolol tartrate (LOPRESSOR) 25 MG tablet Take 1 tablet by mouth daily.     Multiple Vitamin (MULTIVITAMIN WITH MINERALS) TABS tablet Take 1 tablet by mouth daily.     ondansetron (ZOFRAN ODT) 4 MG disintegrating tablet Take 1 tablet (4 mg total) by mouth every 8 (eight) hours as needed. 20 tablet 0   pantoprazole (PROTONIX) 40 MG tablet Take 1 tablet (40 mg total) by mouth daily. 30 tablet 0   polyvinyl alcohol (LIQUIFILM TEARS) 1.4 % ophthalmic solution Place 1 drop into both eyes as needed for dry eyes.     SUMAtriptan (IMITREX) 100 MG tablet Take 1 tablet (100 mg total) by mouth every 2 (two) hours as needed for up to 15 days for migraine. May repeat in 2 hours if headache persists or recurs. Do not exceed 2 doses in 24 hours. 15 tablet 0   venlafaxine XR (EFFEXOR-XR) 37.5 MG 24 hr capsule Take 3 capsules (112.5 mg total) by mouth daily. For mood control 270 capsule 0   Vitamin D, Ergocalciferol, (DRISDOL) 1.25 MG (50000 UNIT) CAPS capsule Take 50,000 Units by mouth once a week.      No current facility-administered medications on file prior to visit.    ALLERGIES: Allergies  Allergen Reactions   Adhesive [Tape] Rash   Latex Rash   Morphine And Related Itching and Other (See Comments)    hallucinations    FAMILY HISTORY: Family History  Problem Relation Age of Onset   Hyperlipidemia Mother    Hypertension Mother    Diabetes Mother    Osteoarthritis Mother    Hyperlipidemia Father    Hypertension Father    Cancer Father    Stroke Father    Osteoarthritis Father    Breast cancer Sister    Stomach cancer Paternal Aunt    Heart attack Maternal Grandmother    Liver cancer Paternal Grandmother    Diabetes Paternal Grandmother    Hypertension Paternal Grandmother    Heart disease Paternal Grandmother    Heart attack Paternal Grandfather    Hypertension Paternal Grandfather    Stroke Paternal Grandfather    Heart disease Paternal Grandfather  Esophageal cancer Neg Hx    Rectal cancer Neg Hx    Inflammatory bowel disease Neg Hx    Liver disease Neg Hx    Pancreatic cancer Neg Hx       Objective:  *** General: No acute distress.  Patient appears ***-groomed.   Head:  Normocephalic/atraumatic Eyes:  Fundi examined but not visualized Neck: supple, no paraspinal tenderness, full range of motion Heart:  Regular rate and rhythm Lungs:  Clear to auscultation bilaterally Back: No paraspinal tenderness Neurological Exam: alert and oriented to person, place, and time.  Speech fluent and not dysarthric, language intact.  CN II-XII intact. Bulk and tone normal, muscle strength 5/5 throughout.  Sensation to light touch intact.  Deep tendon reflexes 2+ throughout, toes downgoing.  Finger to nose testing intact.  Gait normal, Romberg negative.   Metta Clines, DO  CC: ***

## 2022-07-12 ENCOUNTER — Ambulatory Visit: Payer: 59 | Admitting: Neurology

## 2022-07-12 ENCOUNTER — Encounter: Payer: Self-pay | Admitting: Neurology

## 2022-07-12 DIAGNOSIS — Z029 Encounter for administrative examinations, unspecified: Secondary | ICD-10-CM

## 2022-08-01 NOTE — Progress Notes (Signed)
Fax received from [facility]:  Not in network with patient insurance.

## 2022-09-04 ENCOUNTER — Other Ambulatory Visit (HOSPITAL_COMMUNITY): Payer: Self-pay | Admitting: Psychiatry

## 2022-09-04 DIAGNOSIS — R252 Cramp and spasm: Secondary | ICD-10-CM

## 2022-09-05 ENCOUNTER — Encounter (HOSPITAL_COMMUNITY): Payer: Self-pay | Admitting: Psychiatry

## 2022-09-05 ENCOUNTER — Telehealth (HOSPITAL_BASED_OUTPATIENT_CLINIC_OR_DEPARTMENT_OTHER): Payer: 59 | Admitting: Psychiatry

## 2022-09-05 DIAGNOSIS — F319 Bipolar disorder, unspecified: Secondary | ICD-10-CM | POA: Diagnosis not present

## 2022-09-05 DIAGNOSIS — F419 Anxiety disorder, unspecified: Secondary | ICD-10-CM

## 2022-09-05 DIAGNOSIS — R252 Cramp and spasm: Secondary | ICD-10-CM

## 2022-09-05 MED ORDER — VENLAFAXINE HCL ER 37.5 MG PO CP24: 112.5000 mg | ORAL_CAPSULE | Freq: Every day | ORAL | 0 refills | Status: DC

## 2022-09-05 MED ORDER — ARIPIPRAZOLE 5 MG PO TABS: 5.0000 mg | ORAL_TABLET | Freq: Every day | ORAL | 0 refills | Status: DC

## 2022-09-05 MED ORDER — BENZTROPINE MESYLATE 0.5 MG PO TABS: 0.5000 mg | ORAL_TABLET | Freq: Every day | ORAL | 0 refills | Status: DC
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
## 2022-12-01 ENCOUNTER — Other Ambulatory Visit (HOSPITAL_COMMUNITY): Payer: Self-pay | Admitting: Psychiatry

## 2022-12-01 DIAGNOSIS — R252 Cramp and spasm: Secondary | ICD-10-CM

## 2022-12-05 ENCOUNTER — Telehealth (HOSPITAL_BASED_OUTPATIENT_CLINIC_OR_DEPARTMENT_OTHER): Payer: 59 | Admitting: Psychiatry

## 2022-12-05 ENCOUNTER — Encounter (HOSPITAL_COMMUNITY): Payer: Self-pay | Admitting: Psychiatry

## 2022-12-05 DIAGNOSIS — F319 Bipolar disorder, unspecified: Secondary | ICD-10-CM

## 2022-12-05 DIAGNOSIS — F419 Anxiety disorder, unspecified: Secondary | ICD-10-CM | POA: Diagnosis not present

## 2022-12-05 DIAGNOSIS — R252 Cramp and spasm: Secondary | ICD-10-CM

## 2022-12-05 MED ORDER — ARIPIPRAZOLE 5 MG PO TABS: 5.0000 mg | ORAL_TABLET | Freq: Every day | ORAL | 0 refills | Status: DC

## 2022-12-05 MED ORDER — BENZTROPINE MESYLATE 0.5 MG PO TABS
0.5000 mg | ORAL_TABLET | Freq: Every day | ORAL | 0 refills | Status: DC
Start: 2022-12-05 — End: 2023-03-02

## 2022-12-05 MED ORDER — VENLAFAXINE HCL ER 37.5 MG PO CP24: 112.5000 mg | ORAL_CAPSULE | Freq: Every day | ORAL | 0 refills | Status: DC
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
## 2023-03-02 ENCOUNTER — Encounter (HOSPITAL_COMMUNITY): Payer: Self-pay | Admitting: Psychiatry

## 2023-03-02 ENCOUNTER — Telehealth (HOSPITAL_BASED_OUTPATIENT_CLINIC_OR_DEPARTMENT_OTHER): Payer: 59 | Admitting: Psychiatry

## 2023-03-02 VITALS — Wt 175.0 lb

## 2023-03-02 DIAGNOSIS — F319 Bipolar disorder, unspecified: Secondary | ICD-10-CM

## 2023-03-02 DIAGNOSIS — R252 Cramp and spasm: Secondary | ICD-10-CM

## 2023-03-02 DIAGNOSIS — F419 Anxiety disorder, unspecified: Secondary | ICD-10-CM

## 2023-03-02 MED ORDER — BENZTROPINE MESYLATE 0.5 MG PO TABS
0.5000 mg | ORAL_TABLET | Freq: Every day | ORAL | 0 refills | Status: DC
Start: 2023-03-02 — End: 2023-07-12

## 2023-03-02 MED ORDER — VENLAFAXINE HCL ER 37.5 MG PO CP24: 112.5000 mg | ORAL_CAPSULE | Freq: Every day | ORAL | 0 refills | Status: DC

## 2023-03-02 MED ORDER — ARIPIPRAZOLE 5 MG PO TABS: 5.0000 mg | ORAL_TABLET | Freq: Every day | ORAL | 0 refills | Status: DC
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
## 2023-06-08 ENCOUNTER — Telehealth (HOSPITAL_COMMUNITY): Payer: 59 | Admitting: Psychiatry

## 2023-07-12 ENCOUNTER — Telehealth (HOSPITAL_BASED_OUTPATIENT_CLINIC_OR_DEPARTMENT_OTHER): Payer: 59 | Admitting: Psychiatry

## 2023-07-12 ENCOUNTER — Encounter (HOSPITAL_COMMUNITY): Payer: Self-pay | Admitting: Psychiatry

## 2023-07-12 VITALS — Wt 176.0 lb

## 2023-07-12 DIAGNOSIS — F419 Anxiety disorder, unspecified: Secondary | ICD-10-CM | POA: Diagnosis not present

## 2023-07-12 DIAGNOSIS — F319 Bipolar disorder, unspecified: Secondary | ICD-10-CM

## 2023-07-12 DIAGNOSIS — R252 Cramp and spasm: Secondary | ICD-10-CM

## 2023-07-12 MED ORDER — VENLAFAXINE HCL ER 37.5 MG PO CP24: 112.5000 mg | ORAL_CAPSULE | Freq: Every day | ORAL | 0 refills | Status: DC

## 2023-07-12 MED ORDER — BENZTROPINE MESYLATE 0.5 MG PO TABS
0.5000 mg | ORAL_TABLET | Freq: Every day | ORAL | 0 refills | Status: DC
Start: 2023-07-12 — End: 2023-10-12

## 2023-07-12 MED ORDER — ARIPIPRAZOLE 5 MG PO TABS: 5.0000 mg | ORAL_TABLET | Freq: Every day | ORAL | 0 refills | Status: DC
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
## 2023-10-12 ENCOUNTER — Encounter (HOSPITAL_COMMUNITY): Payer: Self-pay | Admitting: Psychiatry

## 2023-10-12 ENCOUNTER — Telehealth (HOSPITAL_COMMUNITY): Payer: 59 | Admitting: Psychiatry

## 2023-10-12 VITALS — Wt 176.0 lb

## 2023-10-12 DIAGNOSIS — F319 Bipolar disorder, unspecified: Secondary | ICD-10-CM | POA: Diagnosis not present

## 2023-10-12 DIAGNOSIS — F419 Anxiety disorder, unspecified: Secondary | ICD-10-CM

## 2023-10-12 DIAGNOSIS — R252 Cramp and spasm: Secondary | ICD-10-CM

## 2023-10-12 MED ORDER — ARIPIPRAZOLE 5 MG PO TABS: 5.0000 mg | ORAL_TABLET | Freq: Every day | ORAL | 0 refills | Status: DC

## 2023-10-12 MED ORDER — VENLAFAXINE HCL ER 37.5 MG PO CP24: 112.5000 mg | ORAL_CAPSULE | Freq: Every day | ORAL | 0 refills | Status: DC

## 2023-10-12 MED ORDER — BENZTROPINE MESYLATE 0.5 MG PO TABS: 0.5000 mg | ORAL_TABLET | Freq: Every day | ORAL | 0 refills | Status: DC
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
## 2024-01-11 ENCOUNTER — Telehealth (HOSPITAL_COMMUNITY): Payer: 59 | Admitting: Psychiatry

## 2024-01-12 ENCOUNTER — Telehealth (HOSPITAL_COMMUNITY): Admitting: Psychiatry

## 2024-01-12 ENCOUNTER — Encounter (HOSPITAL_COMMUNITY): Payer: Self-pay | Admitting: Psychiatry

## 2024-01-12 DIAGNOSIS — F319 Bipolar disorder, unspecified: Secondary | ICD-10-CM | POA: Diagnosis not present

## 2024-01-12 DIAGNOSIS — F419 Anxiety disorder, unspecified: Secondary | ICD-10-CM | POA: Diagnosis not present

## 2024-01-12 DIAGNOSIS — R252 Cramp and spasm: Secondary | ICD-10-CM | POA: Diagnosis not present

## 2024-01-12 MED ORDER — VENLAFAXINE HCL ER 37.5 MG PO CP24: 112.5000 mg | ORAL_CAPSULE | Freq: Every day | ORAL | 0 refills | Status: DC

## 2024-01-12 MED ORDER — BENZTROPINE MESYLATE 0.5 MG PO TABS
0.5000 mg | ORAL_TABLET | Freq: Every day | ORAL | 0 refills | Status: DC
Start: 2024-01-12 — End: 2024-04-15

## 2024-01-12 MED ORDER — ARIPIPRAZOLE 5 MG PO TABS: 5.0000 mg | ORAL_TABLET | Freq: Every day | ORAL | 0 refills | Status: DC
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
## 2024-04-15 ENCOUNTER — Telehealth (HOSPITAL_BASED_OUTPATIENT_CLINIC_OR_DEPARTMENT_OTHER): Admitting: Psychiatry

## 2024-04-15 ENCOUNTER — Encounter (HOSPITAL_COMMUNITY): Payer: Self-pay | Admitting: Psychiatry

## 2024-04-15 DIAGNOSIS — F419 Anxiety disorder, unspecified: Secondary | ICD-10-CM

## 2024-04-15 DIAGNOSIS — F319 Bipolar disorder, unspecified: Secondary | ICD-10-CM

## 2024-04-15 DIAGNOSIS — R252 Cramp and spasm: Secondary | ICD-10-CM | POA: Diagnosis not present

## 2024-04-15 MED ORDER — VENLAFAXINE HCL ER 37.5 MG PO CP24: 112.5000 mg | ORAL_CAPSULE | Freq: Every day | ORAL | 0 refills | Status: DC

## 2024-04-15 MED ORDER — ARIPIPRAZOLE 5 MG PO TABS: 5.0000 mg | ORAL_TABLET | Freq: Every day | ORAL | 0 refills | Status: DC

## 2024-04-15 MED ORDER — BENZTROPINE MESYLATE 0.5 MG PO TABS: 0.5000 mg | ORAL_TABLET | Freq: Every day | ORAL | 0 refills | Status: DC
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
## 2024-07-12 ENCOUNTER — Telehealth (HOSPITAL_COMMUNITY): Admitting: Psychiatry

## 2024-07-16 ENCOUNTER — Telehealth (HOSPITAL_COMMUNITY): Admitting: Psychiatry

## 2024-07-19 ENCOUNTER — Telehealth (HOSPITAL_BASED_OUTPATIENT_CLINIC_OR_DEPARTMENT_OTHER): Admitting: Psychiatry

## 2024-07-19 ENCOUNTER — Encounter (HOSPITAL_COMMUNITY): Payer: Self-pay | Admitting: Psychiatry

## 2024-07-19 VITALS — Wt 185.0 lb

## 2024-07-19 DIAGNOSIS — F319 Bipolar disorder, unspecified: Secondary | ICD-10-CM | POA: Diagnosis not present

## 2024-07-19 DIAGNOSIS — R252 Cramp and spasm: Secondary | ICD-10-CM | POA: Diagnosis not present

## 2024-07-19 DIAGNOSIS — F419 Anxiety disorder, unspecified: Secondary | ICD-10-CM | POA: Diagnosis not present

## 2024-07-19 MED ORDER — VENLAFAXINE HCL ER 37.5 MG PO CP24: 112.5000 mg | ORAL_CAPSULE | Freq: Every day | ORAL | 0 refills | Status: DC

## 2024-07-19 MED ORDER — ARIPIPRAZOLE 5 MG PO TABS: 5.0000 mg | ORAL_TABLET | Freq: Every day | ORAL | 0 refills | Status: DC

## 2024-07-19 MED ORDER — BENZTROPINE MESYLATE 0.5 MG PO TABS: 0.5000 mg | ORAL_TABLET | Freq: Every day | ORAL | 0 refills | Status: DC
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
## 2024-10-08 NOTE — Progress Notes (Signed)
 " Patient Name: Shannon Kemp Patient Date of Birth: [birth year] Patient MR#: [id number]  PCP: Tari ONEIDA Banter, PA-C Date: 10/08/2024    [facility]   Assessment and Plan:    1. Syncope, unspecified syncope type  ECG 12 lead    2. Dizziness         ASSESSMENT & PLAN:  Orthostatic symptoms and sinus tachycardia in pt w anxiety - consider switch to propranolol, I advised her to try compression socks, electrolyte drink and slower position changes.  Recommended leg strengthening exercises.  Also consider the possibility of hypoglycemia causing some symptoms.  Recommended regular exercise and weight loss of truncal obesity.  I do not believe she has had a true syncope but she has fallen/slumped.  It is notable that she felt that she wanted to do that during the tilt table test, held up by being strapped in, with hypertension and tachycardia during that feeling. Could also be vertiginous I suppose.  I offered reassurance that her prior cardiac testing was normal and offered to retest with another echo and Zio if she felt this would help alleviate any anxiety symptoms over this but she was okay with relying on the prior normal testing, as I am high.   FOLLOW UP: The patient will come back to see me as needed  Thank you for your kind referral. I appreciate being able to take care of your patient.  Erna Ardelia Creighton MD   Subjective:    Reason for Consult/Visit: New Patient and Dizziness    History of Present Illness: Shannon Kemp is a 49 y.o. female who presents 10/08/24 for initial evaluation of dizziness and syncope. Has had a cardiac eval for these through [facility] in 2023, with structurally normal heart on TTE 12/2021, and a normal Zio 01/2022 and more recently had a head up tilt test 08/2024 where she was dizzy during the test and also hypertensive throughout the test with no orthostasis or changes consistent with  POTS.  She has a history of migraine headaches, GERD, anemia, OSA, head injury and bipolar disorder.  History of Present Illness The patient is a 49 year old female who presents for evaluation of dizziness and syncope.  She has been experiencing episodes of dizziness for the past 2 to 3 years, which have been increasing in frequency over the last year. These episodes are more likely to occur when transitioning from a seated position to standing, particularly when her legs are not extended. She describes feeling lightheaded and as if she might fall forward. These symptoms have also occurred while driving, necessitating her to pull over due to concerns about potential blackouts. There have been 2 known instances of syncope, one of which occurred while driving in [id number], leading to an emergency room visit. During this episode, she lost consciousness for approximately one minute but did not collide with anything. Additionally, she has a tendency to become unresponsive, which her husband has observed. On one occasion, she experienced dizziness and leaned against a kitchen counter before slumping to the floor.  She has been under the care of a neurologist, who conducted a tilt table test. During the test, she felt well while supine but experienced a strong urge to sit down towards the end. Her blood pressure remained stable during the test, but her heart rate was elevated. Currently, she is on metoprolol  to manage her heart rate, which typically remains below 100. Her resting heart rate is usually around  88 at night, and she becomes anxious if it drops below 70. She has been monitoring her blood sugar levels for the past 2 weeks, during which time it has only dropped into the 50s on a few occasions. No symptoms were experienced during these hypoglycemic episodes. Dizziness occurs 2 to 3 times daily.  She is currently on medication for bipolar disorder, which her doctor believes is beneficial. Additionally, she has  been experiencing worsening dry eye symptoms and has increased the frequency of her eye drop usage to more than four times daily. She has a history of running in high school and has been engaging in indoor walking exercises.  SOCIAL HISTORY Marital Status: Married Exercise: Indoor walking  Pertinent cardiac testing results:   The following portions of the patient's history were reviewed and updated as appropriate:  Medical History: Medical History[1]   Family History: Family History[2]  Medications:  Current Medications[3]  Allergies: Allergies[4]  Social History: Tobacco Use History[5] Social History   Substance and Sexual Activity  Alcohol Use Yes   Alcohol/week: 1.0 standard drink of alcohol   Types: 1 Shots of liquor per week   Comment: Very rare   Social History   Substance and Sexual Activity  Drug Use Never    Review of Systems: A complete ROS performed with pertinent positives as per HPI      Objective:       Physical Exam:  VITAL SIGNS:  Blood pressure 132/89, pulse 92, height 1.676 m (5' 6), weight 84.8 kg (187 lb), SpO2 98%. Wt Readings from Last 3 Encounters:  10/08/24 84.8 kg (187 lb)  08/12/24 86.6 kg (190 lb 14.4 oz)  07/01/24 86 kg (189 lb 9.6 oz)   Body mass index is 30.18 kg/m.  General: WD WN C woman in NAD   HEENT:   PERRL, EOMI.  Oropharynx is moist. No icterus.   Neck: Carotid pulses are 2+.  No bruits.  No adenopathy, thyromegaly or masses.  Jugular venous pressure is normal. No HJR   Lungs:   Respirations are unlabored. The lungs are clear in all fields bilaterally without rales, rhonchi or wheezing.    Percussion note is normal throughout.   Heart:  Heart sounds are regular rate and rhythm  Normal S1. Normal S2.  No S3 or S4.  No R/M/G   Abdomen:   Soft, nontender and nondistended.  There are normal bowel sounds.  No bruits.  No masses or organomegaly.    Extremities: Warm. No rashes or ulcers. No lower extremity pitting  edema.   Pulses: Radial DP and PT pulses are 2+ and symmetrical.   Skin: No lower extremity rashes or ulcers   Neurologic:  The patient is awake, alert, and oriented to time, place, person and situation, and no strength deficits.     EKG: 10/08/2024 sinus rhythm at a rate of 82 bpm borderline left atrial abnormality otherwise normal  Lab Review: Lab Results  Component Value Date   WBC 9.00 08/28/2023   RBC 4.88 08/28/2023   HGB 14.0 08/28/2023   HCT 41.8 08/28/2023   MCV 85.5 08/28/2023   MCH 28.7 08/28/2023   MCHC 33.6 08/28/2023   RDW 14.0 08/28/2023   PLT 267 08/28/2023   MPV 8.1 08/28/2023  . Lab Results  Component Value Date   NA 141 07/01/2024   K 5.8 (H) 07/01/2024   CL 105 07/01/2024   BUN 10 07/01/2024   CREATININE 0.76 07/01/2024   Lab Results  Component Value Date  HGBA1C 5.3 08/23/2019      CVD RISK STRATIFICATION RESULTS: Lab Results  Component Value Date   CHOL 205 (H) 08/28/2023   HDL 42 (L) 08/28/2023   TRIG 165 (H) 08/28/2023    The 10-year ASCVD risk score (Arnett DK, et al., 2019) is: 1.5%   Values used to calculate the score:     Age: 29 years     Clinically relevant sex: Female     Is Non-Hispanic African American: No     Diabetic: No     Tobacco smoker: No     Systolic Blood Pressure: 132 mmHg     Is BP treated: No     HDL Cholesterol: 42 mg/dL     Total Cholesterol: 205 mg/dL        [8] Past Medical History: Diagnosis Date   Abnormal findings on esophagogastroduodenoscopy (EGD) 12/08/2020   Allergic    Anemia 01/1997   Anxiety    Arthritis    Bipolar 2 disorder    (CMD)    Cluster headache    Depression    Family history of breast cancer in sister 04/13/2020   Delon age 46   Fibrocystic breast 04/2011   GERD (gastroesophageal reflux disease)    Head injury 08/2016   Headache, tension-type    Hernia 12/2021   Migraines    Motion sickness    Papilloma of left breast 04/13/2020   Sleep apnea     Syncope   [2] Family History Problem Relation Name Age of Onset   Heart disease Mother Hilma    Diabetes Mother Hilma    Hyperlipidemia Mother Hilma    Hypertension Mother Luna    Heart disease Father Derrill    Hyperlipidemia Father Derrill    Hypertension Father Derrill    Stroke Father Derrill    Colon cancer Father Derrill    Intellectual Disability Father Derrill    Cancer Father Derrill        Colon   Endometrial cancer Father Derrill    Arthritis Father Derrill    Thyroid disease Sister Delon    Breast cancer Sister Delon    ADD / ADHD Son     Early death Maternal Grandmother Milred    COPD Maternal Grandfather Gretel    Cancer Paternal Grandmother Myrtle    Diabetes Paternal Grandmother Myrtle    Heart disease Paternal Grandmother Myrtle    Hyperlipidemia Paternal Grandmother Myrtle    Hypertension Paternal Grandmother Myrtle    Ovarian cancer Paternal Grandmother Myrtle    Arthritis Paternal Grandmother Myrtle    Hypertension Paternal Apolinar Derrill Sr    Hyperlipidemia Paternal Apolinar Derrill Sr    Heart disease Paternal Grandfather Derrill Sr    Stroke Paternal Apolinar Derrill Sr    Breast cancer Other m g aunt   [3] Current Outpatient Medications  Medication Sig Dispense Refill   acetaminophen -caffeine (Excedrin Tension Headache) 500-65 mg tab Take 2 tablets by mouth daily as needed.     albuterol  HFA (PROVENTIL  HFA;VENTOLIN  HFA;PROAIR  HFA) 90 mcg/actuation inhaler Inhale 2 puffs every 4 (four) hours as needed for wheezing or shortness of breath. 8 g 0   ARIPiprazole  (ABILIFY ) 5 mg tablet      benztropine  (COGENTIN ) 0.5 mg tablet Take 0.5 mg by mouth nightly. 30 tablet 11   ergocalciferol (VITAMIN D2) 1,250 mcg (50,000 unit) capsule TAKE 1 CAPSULE BY MOUTH ONE TIME PER WEEK 12 capsule 0   fluticasone  propionate (FLONASE ) 50 mcg/spray nasal spray 2 sprays.  ketoconazole (NIZORAL) 2 % cream Apply topically 2 (two) times a day.  15 g 1   metoprolol  tartrate (LOPRESSOR ) 25 mg tablet TAKE 1 TABLET (25 MG TOTAL) BY MOUTH DAILY. 90 tablet 1   multivitamin cap Take 1 capsule by mouth Once Daily.     polyvinyl alcohol (LIQUIFILM TEARS) 1.4 % ophthalmic solution Administer 1 drop into affected eye(s).     SUMAtriptan  (IMITREX ) 100 mg tablet Take one tablet by mouth at onset of headache; May repeat one tablet in 2 hours if needed 30 tablet 3   venlafaxine  (EFFEXOR  XR) 37.5 mg 24 hr capsule Take 112.5 mg by mouth Once Daily.     No current facility-administered medications for this visit.  [4] Allergies Allergen Reactions   Gluten Protein GI Intolerance   Opioids - Morphine Analogues Itching    hallucinations   Adhesive Rash   Latex Rash  [5] Social History Tobacco Use  Smoking Status Former   Current packs/day: 0.00   Types: Cigarettes   Quit date: 12/02/1999   Years since quitting: 24.8   Passive exposure: Never  Smokeless Tobacco Never  "

## 2024-10-12 ENCOUNTER — Other Ambulatory Visit (HOSPITAL_COMMUNITY): Payer: Self-pay | Admitting: Psychiatry

## 2024-10-12 DIAGNOSIS — F319 Bipolar disorder, unspecified: Secondary | ICD-10-CM

## 2024-10-12 DIAGNOSIS — F419 Anxiety disorder, unspecified: Secondary | ICD-10-CM

## 2024-10-18 ENCOUNTER — Encounter (HOSPITAL_COMMUNITY): Payer: Self-pay | Admitting: Psychiatry

## 2024-10-18 ENCOUNTER — Telehealth (HOSPITAL_COMMUNITY): Admitting: Psychiatry

## 2024-10-18 VITALS — Wt 187.0 lb

## 2024-10-18 DIAGNOSIS — F419 Anxiety disorder, unspecified: Secondary | ICD-10-CM | POA: Diagnosis not present

## 2024-10-18 DIAGNOSIS — F319 Bipolar disorder, unspecified: Secondary | ICD-10-CM

## 2024-10-18 DIAGNOSIS — R252 Cramp and spasm: Secondary | ICD-10-CM | POA: Diagnosis not present

## 2024-10-18 MED ORDER — ARIPIPRAZOLE 5 MG PO TABS: 5.0000 mg | ORAL_TABLET | Freq: Every day | ORAL | 0 refills | Status: AC

## 2024-10-18 MED ORDER — BENZTROPINE MESYLATE 0.5 MG PO TABS: 0.5000 mg | ORAL_TABLET | Freq: Every day | ORAL | 0 refills | Status: AC

## 2024-10-18 MED ORDER — VENLAFAXINE HCL ER 37.5 MG PO CP24: 112.5000 mg | ORAL_CAPSULE | Freq: Every day | ORAL | 0 refills | Status: AC
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
## 2025-01-17 ENCOUNTER — Telehealth (HOSPITAL_COMMUNITY): Admitting: Psychiatry
